# Patient Record
Sex: Male | Born: 1945
Health system: Southern US, Community
[De-identification: ages and names within clinical notes are randomized; demographics above are authoritative.]

## PROBLEM LIST (undated history)

## (undated) DIAGNOSIS — C801 Malignant (primary) neoplasm, unspecified: Secondary | ICD-10-CM

## (undated) DIAGNOSIS — M1990 Unspecified osteoarthritis, unspecified site: Secondary | ICD-10-CM

## (undated) DIAGNOSIS — J189 Pneumonia, unspecified organism: Secondary | ICD-10-CM

## (undated) DIAGNOSIS — C61 Malignant neoplasm of prostate: Secondary | ICD-10-CM

## (undated) DIAGNOSIS — N529 Male erectile dysfunction, unspecified: Secondary | ICD-10-CM

## (undated) DIAGNOSIS — R011 Cardiac murmur, unspecified: Secondary | ICD-10-CM

## (undated) DIAGNOSIS — G629 Polyneuropathy, unspecified: Secondary | ICD-10-CM

## (undated) DIAGNOSIS — I10 Essential (primary) hypertension: Secondary | ICD-10-CM

## (undated) DIAGNOSIS — Z8719 Personal history of other diseases of the digestive system: Secondary | ICD-10-CM

## (undated) DIAGNOSIS — G709 Myoneural disorder, unspecified: Secondary | ICD-10-CM

## (undated) DIAGNOSIS — F419 Anxiety disorder, unspecified: Secondary | ICD-10-CM

## (undated) DIAGNOSIS — M4800 Spinal stenosis, site unspecified: Secondary | ICD-10-CM

## (undated) DIAGNOSIS — K4030 Unilateral inguinal hernia, with obstruction, without gangrene, not specified as recurrent: Secondary | ICD-10-CM

## (undated) DIAGNOSIS — T7840XA Allergy, unspecified, initial encounter: Secondary | ICD-10-CM

## (undated) HISTORY — DX: Myoneural disorder, unspecified: G70.9

## (undated) HISTORY — DX: Essential (primary) hypertension: I10

## (undated) HISTORY — PX: OTHER SURGICAL HISTORY: SHX169

## (undated) HISTORY — PX: COLONOSCOPY: SHX174

## (undated) HISTORY — DX: Cardiac murmur, unspecified: R01.1

## (undated) HISTORY — DX: Allergy, unspecified, initial encounter: T78.40XA

## (undated) HISTORY — DX: Unspecified osteoarthritis, unspecified site: M19.90

## (undated) HISTORY — DX: Male erectile dysfunction, unspecified: N52.9

## (undated) HISTORY — DX: Anxiety disorder, unspecified: F41.9

---

## 2005-04-29 ENCOUNTER — Ambulatory Visit: Payer: Self-pay | Admitting: Family Medicine

## 2005-05-06 ENCOUNTER — Ambulatory Visit: Payer: Self-pay | Admitting: Family Medicine

## 2005-06-01 ENCOUNTER — Ambulatory Visit: Payer: Self-pay | Admitting: Internal Medicine

## 2005-06-07 ENCOUNTER — Ambulatory Visit: Payer: Self-pay | Admitting: Internal Medicine

## 2006-03-26 ENCOUNTER — Ambulatory Visit: Payer: Self-pay | Admitting: Family Medicine

## 2006-06-04 ENCOUNTER — Ambulatory Visit: Payer: Self-pay | Admitting: Family Medicine

## 2006-10-11 ENCOUNTER — Ambulatory Visit: Payer: Self-pay | Admitting: Family Medicine

## 2006-11-15 ENCOUNTER — Ambulatory Visit: Payer: Self-pay | Admitting: Family Medicine

## 2006-11-15 LAB — CONVERTED CEMR LAB
ALT: 33 units/L (ref 0–40)
AST: 28 units/L (ref 0–37)
Albumin: 4.5 g/dL (ref 3.5–5.2)
BUN: 18 mg/dL (ref 6–23)
Basophils Absolute: 0 10*3/uL (ref 0.0–0.1)
Basophils Relative: 0.3 % (ref 0.0–1.0)
CO2: 31 meq/L (ref 19–32)
Calcium: 9.3 mg/dL (ref 8.4–10.5)
Eosinophil percent: 3.1 % (ref 0.0–5.0)
Glomerular Filtration Rate, Af Am: 67 mL/min/{1.73_m2}
Glucose, Bld: 91 mg/dL (ref 70–99)
MCHC: 33.5 g/dL (ref 30.0–36.0)
MCV: 90.2 fL (ref 78.0–100.0)
Monocytes Absolute: 0.4 10*3/uL (ref 0.2–0.7)
Neutro Abs: 3.8 10*3/uL (ref 1.4–7.7)
Neutrophils Relative %: 68.3 % (ref 43.0–77.0)
Potassium: 3.3 meq/L — ABNORMAL LOW (ref 3.5–5.1)
RBC: 4.9 M/uL (ref 4.22–5.81)
RDW: 11.6 % (ref 11.5–14.6)
Sodium: 140 meq/L (ref 135–145)
TSH: 1.4 microintl units/mL (ref 0.35–5.50)
Total Bilirubin: 1.2 mg/dL (ref 0.3–1.2)
VLDL: 27 mg/dL (ref 0–40)

## 2007-02-11 ENCOUNTER — Ambulatory Visit: Payer: Self-pay | Admitting: Family Medicine

## 2007-12-14 ENCOUNTER — Telehealth: Payer: Self-pay | Admitting: Family Medicine

## 2007-12-14 ENCOUNTER — Ambulatory Visit: Payer: Self-pay | Admitting: Family Medicine

## 2007-12-14 DIAGNOSIS — M545 Low back pain: Secondary | ICD-10-CM

## 2007-12-26 ENCOUNTER — Ambulatory Visit: Payer: Self-pay | Admitting: Family Medicine

## 2007-12-29 ENCOUNTER — Ambulatory Visit: Payer: Self-pay | Admitting: Family Medicine

## 2008-11-25 ENCOUNTER — Telehealth: Payer: Self-pay | Admitting: Family Medicine

## 2008-12-19 ENCOUNTER — Telehealth: Payer: Self-pay | Admitting: *Deleted

## 2009-01-10 ENCOUNTER — Ambulatory Visit: Payer: Self-pay | Admitting: Family Medicine

## 2009-01-10 LAB — CONVERTED CEMR LAB
Albumin: 4.2 g/dL (ref 3.5–5.2)
Alkaline Phosphatase: 40 units/L (ref 39–117)
Basophils Relative: 0.5 % (ref 0.0–3.0)
Bilirubin Urine: NEGATIVE
Bilirubin, Direct: 0.1 mg/dL (ref 0.0–0.3)
CO2: 32 meq/L (ref 19–32)
Calcium: 9.5 mg/dL (ref 8.4–10.5)
Cholesterol: 177 mg/dL (ref 0–200)
Direct LDL: 105.6 mg/dL
Eosinophils Absolute: 0.2 10*3/uL (ref 0.0–0.7)
Eosinophils Relative: 3.6 % (ref 0.0–5.0)
GFR calc Af Amer: 57 mL/min
GFR calc non Af Amer: 47 mL/min
Glucose, Bld: 119 mg/dL — ABNORMAL HIGH (ref 70–99)
HDL: 34.4 mg/dL — ABNORMAL LOW (ref >39.0)
Lymphocytes Relative: 25.1 % (ref 12.0–46.0)
MCHC: 35.9 g/dL (ref 30.0–36.0)
Monocytes Absolute: 0.5 10*3/uL (ref 0.1–1.0)
Monocytes Relative: 10.1 % (ref 3.0–12.0)
Neutro Abs: 3.3 10*3/uL (ref 1.4–7.7)
Neutrophils Relative %: 60.7 % (ref 43.0–77.0)
Nitrite: NEGATIVE
PSA: 2.76 ng/mL (ref 0.10–4.00)
Platelets: 199 10*3/uL (ref 150–400)
Sodium: 140 meq/L (ref 135–145)
TSH: 1.27 microintl units/mL (ref 0.35–5.50)
Triglycerides: 261 mg/dL (ref 0–149)
WBC: 5.4 10*3/uL (ref 4.5–10.5)

## 2009-01-17 ENCOUNTER — Ambulatory Visit: Payer: Self-pay | Admitting: Family Medicine

## 2009-01-17 DIAGNOSIS — M1990 Unspecified osteoarthritis, unspecified site: Secondary | ICD-10-CM | POA: Insufficient documentation

## 2009-01-17 DIAGNOSIS — F528 Other sexual dysfunction not due to a substance or known physiological condition: Secondary | ICD-10-CM | POA: Insufficient documentation

## 2009-01-17 DIAGNOSIS — I10 Essential (primary) hypertension: Secondary | ICD-10-CM | POA: Insufficient documentation

## 2009-10-21 ENCOUNTER — Ambulatory Visit: Payer: Self-pay | Admitting: Family Medicine

## 2010-05-18 ENCOUNTER — Ambulatory Visit: Payer: Self-pay | Admitting: Family Medicine

## 2010-05-18 LAB — CONVERTED CEMR LAB
ALT: 28 units/L (ref 0–53)
BUN: 25 mg/dL — ABNORMAL HIGH (ref 6–23)
Basophils Relative: 0.5 % (ref 0.0–3.0)
CO2: 33 meq/L — ABNORMAL HIGH (ref 19–32)
Calcium: 9.4 mg/dL (ref 8.4–10.5)
Chloride: 98 meq/L (ref 96–112)
Cholesterol: 166 mg/dL (ref 0–200)
Creatinine, Ser: 1.3 mg/dL (ref 0.4–1.5)
Direct LDL: 97.7 mg/dL
GFR calc non Af Amer: 58.66 mL/min (ref >60)
Glucose, Bld: 101 mg/dL — ABNORMAL HIGH (ref 70–99)
Glucose, Urine, Semiquant: NEGATIVE
HCT: 42.1 % (ref 39.0–52.0)
Hemoglobin: 14.8 g/dL (ref 13.0–17.0)
MCHC: 35.1 g/dL (ref 30.0–36.0)
Monocytes Relative: 7.5 % (ref 3.0–12.0)
Neutrophils Relative %: 68.4 % (ref 43.0–77.0)
Nitrite: NEGATIVE
RBC: 4.63 M/uL (ref 4.22–5.81)
TSH: 1.8 microintl units/mL (ref 0.35–5.50)
Total Bilirubin: 1.2 mg/dL (ref 0.3–1.2)
Triglycerides: 206 mg/dL — ABNORMAL HIGH (ref 0.0–149.0)
VLDL: 41.2 mg/dL — ABNORMAL HIGH (ref 0.0–40.0)
WBC Urine, dipstick: NEGATIVE
WBC: 6.2 10*3/uL (ref 4.5–10.5)

## 2010-06-01 ENCOUNTER — Ambulatory Visit: Payer: Self-pay | Admitting: Family Medicine

## 2010-06-03 ENCOUNTER — Telehealth: Payer: Self-pay | Admitting: Family Medicine

## 2010-10-09 ENCOUNTER — Telehealth: Payer: Self-pay | Admitting: *Deleted

## 2011-01-26 NOTE — Assessment & Plan Note (Signed)
Summary: cpx/njr/pt rsc/cjr   Vital Signs:  Patient profile:   65 year old male Height:      70 inches Weight:      200 pounds BMI:     28.80 Temp:     98.1 degrees F oral BP sitting:   130 / 90  (left arm) Cuff size:   regular  Vitals Entered By: Kern Reap CMA Duncan Dull) (June 01, 2010 10:52 AM) CC: cpx   CC:  cpx.  History of Present Illness: Antonio Newton is a 65 year old male, nonsmoker, who comes in today for evaluation of hypertension erectile dysfunction.  His hypertension.  History with atenolol 100 mg daily and Maxzide one daily BP 130/90.  He went to the [facility]rology Center for evaluation of erectile dysfunction.  he was told he had low testosterone.  He check a testosterone supplement for 3 months and then stopped it.  He's due to go back to them for follow-up.  They checked his prostate yearly.  He would also like a refill of the Hydromet for his cough twice yearly.  He does not get annual eye exams, he does take an aspirin daily.  Tetanus 2003, flu shot 2010.    Allergies: 1)  ! * Pcn  Past History:  Past medical, surgical, family and social histories (including risk factors) reviewed, and no changes noted (except as noted below).  Past Medical History: Reviewed history from 01/17/2009 and no changes required. hypertension erectile dysfunction Osteoarthritis left hip Hypertension  Family History: Reviewed history from 12/14/2007 and no changes required. Family History Hypertension Family History of Prostate CA 1st degree relative <50  Social History: Reviewed history from 12/14/2007 and no changes required. 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 No apparent alopecia or balding. Eyes:  No corneal or conjunctival inflammation noted. EOMI. Perrla. Funduscopic exam benign, without hemorrhages, exudates or papilledema. Vision grossly normal. Ears:  External ear exam shows no significant lesions or deformities.  Otoscopic examination reveals clear canals, tympanic membranes are intact bilaterally without bulging, retraction, inflammation or discharge. Hearing is grossly normal bilaterally. Nose:  External nasal examination shows no deformity or inflammation. Nasal mucosa are pink and moist without lesions or exudates. Mouth:  Oral mucosa and oropharynx without lesions or exudates.  Teeth in good repair. Neck:  No deformities, masses, or tenderness noted. Chest Wall:  No deformities, masses, tenderness or gynecomastia noted. Breasts:  No masses or gynecomastia noted Lungs:  Normal respiratory effort, chest expands symmetrically. Lungs are clear to auscultation, no crackles or wheezes. Heart:  Normal rate and regular rhythm. S1 and S2 normal without gallop, murmur, click, rub or other extra sounds. Abdomen:  Bowel sounds positive,abdomen soft and non-tender without masses, organomegaly or hernias noted. Msk:  No deformity or scoliosis noted of thoracic or lumbar spine.   Pulses:  R and L carotid,radial,femoral,dorsalis pedis and posterior tibial pulses are full and equal bilaterally Extremities:  No clubbing, cyanosis, edema, or deformity noted with normal full range of motion of all joints.   Neurologic:  No cranial nerve deficits noted. Station and gait are normal. Plantar reflexes are down-going bilaterally. DTRs are symmetrical throughout. Sensory, motor and coordinative functions appear intact. Skin:  Intact without suspicious lesions or  rashes Cervical Nodes:  No lymphadenopathy noted Axillary Nodes:  No palpable lymphadenopathy Inguinal Nodes:  No significant adenopathy Psych:  Cognition and judgment appear  intact. Alert and cooperative with normal attention span and concentration. No apparent delusions, illusions, hallucinations   Impression & Recommendations:  Problem # 1:  ROUTINE GENERAL MEDICAL EXAM@HEALTH  CARE FACL (ICD-V70.0) Assessment Unchanged  Orders: Prescription Created Electronically [phone]) EKG w/ Interpretation (93000)  Problem # 2:  HYPERTENSION (ICD-401.9) Assessment: Improved  His updated medication list for this problem includes:    Atenolol 100 Mg Tabs (Atenolol) .Marland Kitchen... Take 1 tablet by mouth once a day    Maxzide 75-50 Mg Tabs (Triamterene-hctz) .Marland Kitchen... Take 1 tablet by mouth once a day  Orders: Prescription Created Electronically [phone]) EKG w/ Interpretation (93000)  Complete Medication List: 1)  Atenolol 100 Mg Tabs (Atenolol) .... Take 1 tablet by mouth once a day 2)  Maxzide 75-50 Mg Tabs (Triamterene-hctz) .... Take 1 tablet by mouth once a day 3)  Exedrin  4)  Asa 81  5)  Mvi  6)  Ca + & Vit D  7)  Viagra 100 Mg Tabs (Sildenafil citrate) .... Uad 8)  Hydrocodone-homatropine 5-1.5 Mg/39ml Syrp (Hydrocodone-homatropine) .... One tsp by mouth q 4 to 6 hours prn cough 9)  Fish Oil Oil (Fish oil) .... Take one tab by mouth once daily 10)  Magnesium 200 Mg Tabs (Magnesium) .... Take one tab by mouth once daily  Patient Instructions: 1)  Please schedule a follow-up appointment in 1 year. 2)  It is important that you exercise regularly at least 20 minutes 5 times a week. If you develop chest pain, have severe difficulty breathing, or feel very tired , stop exercising immediately and seek medical attention. 3)  Schedule a colonoscopy/sigmoidoscopy to help detect colon cancer. 4)  Take an Aspirin every day. Prescriptions: HYDROCODONE-HOMATROPINE 5-1.5 MG/5ML SYRP (HYDROCODONE-HOMATROPINE) one tsp by mouth q 4 to 6 hours prn cough  #120  ml x 1   Entered and Authorized by:   Roderick Pee MD   Signed by:   Roderick Pee MD on 06/01/2010   Method used:   Print  then Give to Patient   RxID:   [id number] MAXZIDE 75-50 MG TABS (TRIAMTERENE-HCTZ) Take 1 tablet by mouth once a day  #100 Each x 3   Entered and Authorized by:   Roderick Pee MD   Signed by:   Roderick Pee MD on 06/01/2010   Method used:   Electronically to        [facility] [address] (retail)       [phone]       [place], [place]  [address]       Ph: [phone]       Fax: [phone]   RxID:   [id number] ATENOLOL 100 MG TABS (ATENOLOL) Take 1 tablet by mouth once a day  #100 Each x 3   Entered and Authorized by:   Roderick Pee MD   Signed by:   Roderick Pee MD on 06/01/2010   Method used:   Electronically to        [facility] [address] (retail)       [phone]       [place], [place]  [address]       Ph: [phone]       Fax: [phone]  RxID:   [id number]    Immunization History:  Influenza Immunization History:    Influenza:  historical (09/26/2009)

## 2011-01-26 NOTE — Progress Notes (Signed)
  Phone Note From Pharmacy   Summary of Call: Maxzide 75-50 mg tabs are on back order with no release date. [facility] does have 37.5/ 25mg . Is it ok to switch strengths? ...... OK to switch strengths. ........Marland KitchenPer DR. Todd verbally........................................................................................................................................................ Initial call taken by: Kathrynn Speed CMA,  June 03, 2010 10:32 AM

## 2011-01-26 NOTE — Progress Notes (Signed)
Summary: REFILL REQUEST  Phone Note Refill Request Message from:  Patient on October 09, 2010 12:38 PM  Refills Requested: Medication #1:  VIAGRA 100 MG TABS UAD   Notes: Psychologist, forensic - [address]    Initial call taken by: Debbra Riding,  October 09, 2010 12:38 PM  Follow-up for Phone Call        Viagra 100 mg, dispensed 6 tablets directions.  Uses directed refills x 11 Follow-up by: Roderick Pee MD,  October 09, 2010 2:08 PM    Prescriptions: VIAGRA 100 MG TABS (SILDENAFIL CITRATE) UAD  #6 x 11   Entered by:   Lynann Beaver CMA   Authorized by:   Roderick Pee MD   Signed by:   Lynann Beaver CMA on 10/09/2010   Method used:   Electronically to        [facility] [address] (retail)       [phone]       [place], [place]  [address]       Ph: [phone]       Fax: [phone]   RxID:   [id number]

## 2011-05-11 NOTE — Assessment & Plan Note (Signed)
[facility]                                 ON-CALL NOTE   Antonio Newton, Antonio Newton                      MRN:          [id number]  DATE:12/24/2007                            DOB:          December 27, 1946    PRIMARY CARE PHYSICIAN:  Dr. Tawanna Cooler.   [name] is a patient of Dr. Nelida Meuse who reports that he was seen on  the 18th for low back pain radiating into his leg. He was started on  some antiinflammatory, as well as Flexeril and a prednisone taper. The  patient reports that he was feeling better and therefore, over the  weekend, he started to walk around in the mall and be more active. Last  night, he noted some increasing discomfort again and he reports that the  pain is again present. He does report that it is not as severe as it was  previously when he saw Dr. Tawanna Cooler. He states that this is localized to the  right hip. He denies any bowel or bladder dysfunction. He does note some  radiculopathy into the knee, but it is not worse than it was previously.  He was wondering what he could do until he sees Dr. Tawanna Cooler on Tuesday.   PLAN:  I advised the patient that he could go ahead and take some  ibuprofen as long as he has no side effects. He can take 600 mg of  ibuprofen every 8 hours as needed with food until he calls Dr. Tawanna Cooler  tomorrow morning for further recommendation. The patient will call with  any other concerns.     Leanne Chang, M.D.  Electronically Signed    LA/MedQ  DD: 12/24/2007  DT: 12/25/2007  Job #: [id number]

## 2011-05-14 NOTE — Assessment & Plan Note (Signed)
[facility]                                 ON-CALL NOTE   RITTER, HELSLEY                      MRN:          [id number]  DATE:02/11/2007                            DOB:          [birth year]    PRIMARY:  Dr. Tawanna Cooler.   Phone number [phone].   SUBJECTIVE:  Bad chest cold, occasional wheezing.   ASSESSMENT AND PLAN:  Come to Saturday clinic.     Kerby Nora, MD  Electronically Signed    AB/MedQ  DD: 02/12/2007  DT: 02/12/2007  Job #: [id number]

## 2011-06-07 ENCOUNTER — Other Ambulatory Visit (INDEPENDENT_AMBULATORY_CARE_PROVIDER_SITE_OTHER): Payer: BC Managed Care – PPO

## 2011-06-07 DIAGNOSIS — Z0000 Encounter for general adult medical examination without abnormal findings: Secondary | ICD-10-CM

## 2011-06-07 LAB — CBC WITH DIFFERENTIAL/PLATELET
Eosinophils Relative: 4.8 % (ref 0.0–5.0)
HCT: 41.6 % (ref 39.0–52.0)
Lymphs Abs: 1.3 10*3/uL (ref 0.7–4.0)
MCHC: 34.7 g/dL (ref 30.0–36.0)
MCV: 91.3 fl (ref 78.0–100.0)
RBC: 4.56 Mil/uL (ref 4.22–5.81)
RDW: 13.1 % (ref 11.5–14.6)
WBC: 5.6 10*3/uL (ref 4.5–10.5)

## 2011-06-07 LAB — LIPID PANEL
HDL: 39.3 mg/dL (ref >39.00)
LDL Cholesterol: 110 mg/dL — ABNORMAL HIGH (ref 0–99)
Total CHOL/HDL Ratio: 5
Triglycerides: 148 mg/dL (ref 0.0–149.0)

## 2011-06-07 LAB — BASIC METABOLIC PANEL
BUN: 28 mg/dL — ABNORMAL HIGH (ref 6–23)
CO2: 30 mEq/L (ref 19–32)
Calcium: 9.2 mg/dL (ref 8.4–10.5)
Chloride: 102 mEq/L (ref 96–112)
Creatinine, Ser: 1.6 mg/dL — ABNORMAL HIGH (ref 0.4–1.5)
GFR: 45.43 mL/min — ABNORMAL LOW (ref >60.00)
Glucose, Bld: 108 mg/dL — ABNORMAL HIGH (ref 70–99)

## 2011-06-07 LAB — POCT URINALYSIS DIPSTICK
Blood, UA: NEGATIVE
Ketones, UA: NEGATIVE
Protein, UA: NEGATIVE
Spec Grav, UA: 1.02
pH, UA: 6.5

## 2011-06-07 LAB — HEPATIC FUNCTION PANEL
Bilirubin, Direct: 0.1 mg/dL (ref 0.0–0.3)
Total Bilirubin: 0.9 mg/dL (ref 0.3–1.2)

## 2011-06-14 ENCOUNTER — Encounter: Payer: Self-pay | Admitting: Family Medicine

## 2011-06-14 ENCOUNTER — Other Ambulatory Visit: Payer: Self-pay | Admitting: Family Medicine

## 2011-06-25 ENCOUNTER — Encounter: Payer: Self-pay | Admitting: Family Medicine

## 2011-06-28 ENCOUNTER — Ambulatory Visit (INDEPENDENT_AMBULATORY_CARE_PROVIDER_SITE_OTHER): Payer: BC Managed Care – PPO | Admitting: Family Medicine

## 2011-06-28 ENCOUNTER — Encounter: Payer: Self-pay | Admitting: Family Medicine

## 2011-06-28 DIAGNOSIS — F528 Other sexual dysfunction not due to a substance or known physiological condition: Secondary | ICD-10-CM

## 2011-06-28 DIAGNOSIS — I10 Essential (primary) hypertension: Secondary | ICD-10-CM

## 2011-06-28 MED ORDER — ATENOLOL 100 MG PO TABS: 100.0000 mg | ORAL_TABLET | Freq: Every day | ORAL | Status: DC

## 2011-06-28 MED ORDER — TRIAMTERENE-HCTZ 75-50 MG PO TABS: 1.0000 | ORAL_TABLET | Freq: Every day | ORAL | Status: DC

## 2011-06-28 MED ORDER — SILDENAFIL CITRATE 100 MG PO TABS: 100.0000 mg | ORAL_TABLET | Freq: Every day | ORAL | Status: DC | PRN

## 2011-06-28 NOTE — Patient Instructions (Addendum)
Continue your current medications.,,,,,,,,,,,,Except stop the diuretic, and drinking 24 ounces of water a day.  Checked her blood pressure daily at home.  Return for weeks for follow-up fasting lab one week prior

## 2011-06-28 NOTE — Progress Notes (Signed)
  Subjective:    Patient ID: Antonio Newton, male    DOB: 08/22/1946, 65 y.o.   MRN: [id number]  HPIdannie Is a 65 year old, married male, nonsmoker, who comes in today for evaluation of hypertension, and erectile dysfunction.  Two hypertension.  He takes Tenormin 100 mg daily and Maxzide 75 mg daily.  BP 124/88.  Erectile dysfunction.  He uses Viagra 100 mg p.r.n.  He also takes an 81-mg baby aspirin.  He has routine eye care, dental care, colonoscopy, normal, tetanus, 2003, information given on shingles    Review of Systems  Constitutional: Negative.   HENT: Negative.   Eyes: Negative.   Respiratory: Negative.   Cardiovascular: Negative.   Gastrointestinal: Negative.   Genitourinary: Negative.   Musculoskeletal: Negative.   Skin: Negative.   Neurological: Negative.   Hematological: Negative.   Psychiatric/Behavioral: Negative.        Objective:   Physical Exam  Constitutional: He is oriented to person, place, and time. He appears well-developed and well-nourished.  HENT:  Head: Normocephalic and atraumatic.  Right Ear: External ear normal.  Left Ear: External ear normal.  Nose: Nose normal.  Mouth/Throat: Oropharynx is clear and moist.  Eyes: Conjunctivae and EOM are normal. Pupils are equal, round, and reactive to light.  Neck: Normal range of motion. Neck supple. No JVD present. No tracheal deviation present. No thyromegaly present.  Cardiovascular: Normal rate, regular rhythm, normal heart sounds and intact distal pulses.  Exam reveals no gallop and no friction rub.   No murmur heard. Pulmonary/Chest: Effort normal and breath sounds normal. No stridor. No respiratory distress. He has no wheezes. He has no rales. He exhibits no tenderness.  Abdominal: Soft. Bowel sounds are normal. He exhibits no distension and no mass. There is no tenderness. There is no rebound and no guarding.  Genitourinary: Rectum normal, prostate normal and penis normal. Guaiac negative  stool. No penile tenderness.  Musculoskeletal: Normal range of motion. He exhibits no edema and no tenderness.  Lymphadenopathy:    He has no cervical adenopathy.  Neurological: He is alert and oriented to person, place, and time. He has normal reflexes. No cranial nerve deficit. He exhibits normal muscle tone.  Skin: Skin is warm and dry. No rash noted. No erythema. No pallor.  Psychiatric: He has a normal mood and affect. His behavior is normal. Judgment and thought content normal.          Assessment & Plan:  Healthy male.  Hypertension.  Continue Tenormin and Maxzide.  Erectile dysfunction continue Viagra p.r.n.

## 2011-07-01 ENCOUNTER — Encounter: Payer: Self-pay | Admitting: Family Medicine

## 2011-07-06 ENCOUNTER — Telehealth: Payer: Self-pay | Admitting: *Deleted

## 2011-07-06 NOTE — Telephone Encounter (Signed)
Since going off Triamterene-Hctz, pt complains of headaches q pm.  Is in [place] and wants to go back on the med?

## 2011-07-06 NOTE — Telephone Encounter (Signed)
Restart Maxzide one tablet daily

## 2011-07-07 NOTE — Telephone Encounter (Signed)
BP readings are running 160's/80's.  Advised pt to take Maxide, and monitor BP.

## 2011-07-23 ENCOUNTER — Telehealth: Payer: Self-pay | Admitting: *Deleted

## 2011-07-23 NOTE — Telephone Encounter (Signed)
Wants to know whether to keep his appt this Tuesday???

## 2011-07-23 NOTE — Telephone Encounter (Signed)
Pt has restarted his blood pressure medication because of headaches he would like to know when to follow up with dr todd and labs for his kidneys?

## 2011-07-27 ENCOUNTER — Other Ambulatory Visit: Payer: BC Managed Care – PPO

## 2011-08-02 NOTE — Telephone Encounter (Signed)
Follow-up appointment as outlined

## 2011-08-03 ENCOUNTER — Ambulatory Visit: Payer: BC Managed Care – PPO | Admitting: Family Medicine

## 2011-10-01 ENCOUNTER — Telehealth: Payer: Self-pay | Admitting: Family Medicine

## 2011-10-01 NOTE — Telephone Encounter (Signed)
Pt wants to know if he is due to get a tdap vax? Also pt is turning 65 on [birth year] and is wondering if he needs to get pneumonia vax prior to birth date?

## 2011-10-01 NOTE — Telephone Encounter (Signed)
Left message on machine for patient

## 2011-10-06 ENCOUNTER — Ambulatory Visit: Payer: BC Managed Care – PPO

## 2011-10-06 ENCOUNTER — Ambulatory Visit (INDEPENDENT_AMBULATORY_CARE_PROVIDER_SITE_OTHER): Payer: BC Managed Care – PPO | Admitting: Family Medicine

## 2011-10-06 DIAGNOSIS — Z23 Encounter for immunization: Secondary | ICD-10-CM

## 2011-10-06 DIAGNOSIS — Z2911 Encounter for prophylactic immunotherapy for respiratory syncytial virus (RSV): Secondary | ICD-10-CM

## 2012-03-27 ENCOUNTER — Telehealth: Payer: Self-pay | Admitting: Family Medicine

## 2012-03-27 MED ORDER — PROMETHAZINE HCL 25 MG RE SUPP: 25.0000 mg | Freq: Four times a day (QID) | RECTAL | Status: DC | PRN

## 2012-03-27 NOTE — Telephone Encounter (Signed)
Pt had been experiencing Vomiting,and Diarrhea since last night and would like to be seen today.

## 2012-03-27 NOTE — Telephone Encounter (Signed)
Clear liquids and Phenergan 25 mg suppositories #6 direction Devoria Glassing every 6-8 hours for nausea and vomiting

## 2012-03-27 NOTE — Telephone Encounter (Signed)
Spoke with wife and patient does not have any pain or fever.  [facility] [address]

## 2012-03-27 NOTE — Telephone Encounter (Signed)
rx sent and spoke with wife

## 2012-04-21 ENCOUNTER — Encounter: Payer: Self-pay | Admitting: Internal Medicine

## 2012-07-27 ENCOUNTER — Other Ambulatory Visit: Payer: Self-pay | Admitting: Family Medicine

## 2012-07-31 ENCOUNTER — Telehealth: Payer: Self-pay | Admitting: Family Medicine

## 2012-07-31 NOTE — Telephone Encounter (Signed)
Pt called and rcvd a notice that pt needs to sch a colonoscopy. Pt wanted to know if he needed to get colonoscopy sched before his cpx in October?

## 2012-07-31 NOTE — Telephone Encounter (Signed)
Please ask him to call GI directly

## 2012-08-01 NOTE — Telephone Encounter (Signed)
Left detailed message on machine with GI phone number and last colonoscopy date

## 2012-10-04 ENCOUNTER — Other Ambulatory Visit: Payer: BC Managed Care – PPO

## 2012-10-09 ENCOUNTER — Other Ambulatory Visit: Payer: Self-pay | Admitting: Family Medicine

## 2012-10-19 ENCOUNTER — Encounter: Payer: BC Managed Care – PPO | Admitting: Family Medicine

## 2012-11-08 ENCOUNTER — Other Ambulatory Visit: Payer: Self-pay | Admitting: Family Medicine

## 2012-11-29 ENCOUNTER — Other Ambulatory Visit (INDEPENDENT_AMBULATORY_CARE_PROVIDER_SITE_OTHER): Payer: Medicare Other

## 2012-11-29 DIAGNOSIS — I10 Essential (primary) hypertension: Secondary | ICD-10-CM

## 2012-11-29 DIAGNOSIS — Z0000 Encounter for general adult medical examination without abnormal findings: Secondary | ICD-10-CM

## 2012-11-29 LAB — CBC WITH DIFFERENTIAL/PLATELET
Basophils Relative: 0.4 % (ref 0.0–3.0)
Eosinophils Relative: 4.7 % (ref 0.0–5.0)
HCT: 44.7 % (ref 39.0–52.0)
Hemoglobin: 14.9 g/dL (ref 13.0–17.0)
Lymphs Abs: 1.2 10*3/uL (ref 0.7–4.0)
MCV: 91.1 fl (ref 78.0–100.0)
Monocytes Absolute: 0.4 10*3/uL (ref 0.1–1.0)
Monocytes Relative: 7.4 % (ref 3.0–12.0)
Neutro Abs: 3.8 10*3/uL (ref 1.4–7.7)
WBC: 5.7 10*3/uL (ref 4.5–10.5)

## 2012-11-29 LAB — BASIC METABOLIC PANEL
BUN: 25 mg/dL — ABNORMAL HIGH (ref 6–23)
CO2: 32 mEq/L (ref 19–32)
Calcium: 9.4 mg/dL (ref 8.4–10.5)
Creatinine, Ser: 1.4 mg/dL (ref 0.4–1.5)
GFR: 56.21 mL/min — ABNORMAL LOW (ref >60.00)
Glucose, Bld: 99 mg/dL (ref 70–99)

## 2012-11-29 LAB — LIPID PANEL
HDL: 37.8 mg/dL — ABNORMAL LOW (ref >39.00)
Total CHOL/HDL Ratio: 5

## 2012-11-29 LAB — POCT URINALYSIS DIPSTICK
Bilirubin, UA: NEGATIVE
Glucose, UA: NEGATIVE
Leukocytes, UA: NEGATIVE
Nitrite, UA: NEGATIVE
pH, UA: 7

## 2012-11-29 LAB — HEPATIC FUNCTION PANEL
ALT: 40 U/L (ref 0–53)
Total Bilirubin: 1 mg/dL (ref 0.3–1.2)

## 2012-11-29 LAB — PSA: PSA: 2.87 ng/mL (ref 0.10–4.00)

## 2012-12-06 ENCOUNTER — Ambulatory Visit (INDEPENDENT_AMBULATORY_CARE_PROVIDER_SITE_OTHER): Payer: Medicare Other | Admitting: Family Medicine

## 2012-12-06 ENCOUNTER — Encounter: Payer: Self-pay | Admitting: Family Medicine

## 2012-12-06 VITALS — BP 140/90 | Temp 98.3°F | Ht 71.0 in | Wt 204.0 lb

## 2012-12-06 DIAGNOSIS — F528 Other sexual dysfunction not due to a substance or known physiological condition: Secondary | ICD-10-CM

## 2012-12-06 DIAGNOSIS — E291 Testicular hypofunction: Secondary | ICD-10-CM

## 2012-12-06 DIAGNOSIS — R7989 Other specified abnormal findings of blood chemistry: Secondary | ICD-10-CM | POA: Insufficient documentation

## 2012-12-06 DIAGNOSIS — M1990 Unspecified osteoarthritis, unspecified site: Secondary | ICD-10-CM

## 2012-12-06 DIAGNOSIS — Z0000 Encounter for general adult medical examination without abnormal findings: Secondary | ICD-10-CM

## 2012-12-06 DIAGNOSIS — Z23 Encounter for immunization: Secondary | ICD-10-CM

## 2012-12-06 DIAGNOSIS — I10 Essential (primary) hypertension: Secondary | ICD-10-CM

## 2012-12-06 MED ORDER — SILDENAFIL CITRATE 100 MG PO TABS: 100.0000 mg | ORAL_TABLET | Freq: Every day | ORAL | Status: DC | PRN

## 2012-12-06 MED ORDER — ATENOLOL 100 MG PO TABS: 100.0000 mg | ORAL_TABLET | Freq: Every day | ORAL | Status: DC

## 2012-12-06 MED ORDER — TRIAMTERENE-HCTZ 75-50 MG PO TABS: 1.0000 | ORAL_TABLET | Freq: Every day | ORAL | Status: DC

## 2012-12-06 NOTE — Patient Instructions (Signed)
Continue your current medication  Check a blood pressure daily in the morning for 3 weeks to be sure your blood pressures at goal,,,,,,,,,,, 135/85 or less  Return in one year for general physical examination sooner if any problems

## 2012-12-06 NOTE — Progress Notes (Signed)
  Subjective:    Patient ID: Antonio Newton, male    DOB: 06-Jul-1946, 66 y.o.   MRN: [id number]  HPI Antonio Newton is a 66 year old married male nonsmoker who comes in today for his first Medicare wellness examination because of a history of underlying hypertension erectile dysfunction and low testosterone  He takes an aspirin tablet daily along with 100 mg of Tenormin and Maxide 75-50 dose one daily for hypertension BP 140/90. He's going to do some blood pressure monitoring at home: 35/85 or less.  He's on AndroGel by Dr. Reece Agar. his urologist  He uses Viagra 100 mg when necessary for ED  He gets routine eye care, dental care, colonoscopy and GI, tetanus 2003 booster today, seasonal flu shot today, shingles 2012, Pneumovax next week   Review of Systems  Constitutional: Negative.   HENT: Negative.   Eyes: Negative.   Respiratory: Negative.   Cardiovascular: Negative.   Gastrointestinal: Negative.   Genitourinary: Negative.   Musculoskeletal: Negative.   Skin: Negative.   Neurological: Negative.   Hematological: Negative.   Psychiatric/Behavioral: Negative.        Objective:   Physical Exam  Constitutional: He is oriented to person, place, and time. He appears well-developed and well-nourished.  HENT:  Head: Normocephalic and atraumatic.  Right Ear: External ear normal.  Left Ear: External ear normal.  Nose: Nose normal.  Mouth/Throat: Oropharynx is clear and moist.  Eyes: Conjunctivae normal and EOM are normal. Pupils are equal, round, and reactive to light.  Neck: Normal range of motion. Neck supple. No JVD present. No tracheal deviation present. No thyromegaly present.  Cardiovascular: Normal rate, regular rhythm, normal heart sounds and intact distal pulses.  Exam reveals no gallop and no friction rub.   No murmur heard. Pulmonary/Chest: Effort normal and breath sounds normal. No stridor. No respiratory distress. He has no wheezes. He has no rales. He exhibits no tenderness.   Abdominal: Soft. Bowel sounds are normal. He exhibits no distension and no mass. There is no tenderness. There is no rebound and no guarding.  Genitourinary: Rectum normal, prostate normal and penis normal. Guaiac negative stool. No penile tenderness.  Musculoskeletal: Normal range of motion. He exhibits no edema and no tenderness.  Lymphadenopathy:    He has no cervical adenopathy.  Neurological: He is alert and oriented to person, place, and time. He has normal reflexes. No cranial nerve deficit. He exhibits normal muscle tone.  Skin: Skin is warm and dry. No rash noted. No erythema. No pallor.  Psychiatric: He has a normal mood and affect. His behavior is normal. Judgment and thought content normal.   He is going to see his urologist for followup on the low testosterone and this winter therefore we will defer a rectal exam and prostate exam to urology's       Assessment & Plan:  Healthy male  Hypertension continue Tenormin Maxzide and aspirin daily  Low testosterone followed by Dr.bg ,,,,,,,, urology  Erectile dysfunction continue Viagra when necessary

## 2013-04-04 ENCOUNTER — Encounter: Payer: Self-pay | Admitting: Internal Medicine

## 2013-11-08 ENCOUNTER — Telehealth: Payer: Self-pay | Admitting: Family Medicine

## 2013-11-08 NOTE — Telephone Encounter (Signed)
Pt has back pain, like a pinched nerve. Doesn't want to wait until his cpe appt on 12/17, even though this issue has been going on for years, but has gotten worse over the last couple of months.

## 2013-11-08 NOTE — Telephone Encounter (Signed)
Left message on machine for patient to schedule an appointment tomorrow with a different provider or with Dr Tawanna Cooler next week

## 2013-11-09 ENCOUNTER — Telehealth: Payer: Self-pay | Admitting: Family Medicine

## 2013-11-09 DIAGNOSIS — M1990 Unspecified osteoarthritis, unspecified site: Secondary | ICD-10-CM

## 2013-11-09 NOTE — Telephone Encounter (Signed)
Patient is calling because he has nerve damage or a pinched nerve.  He has had a history of this in the past.  He would like to know if he should come in for an office visit or go to a neurologist? If he needs an office visit then he will need a morning or early afternoon because he works evenings.

## 2013-11-12 NOTE — Telephone Encounter (Signed)
Left message on machine for patient to go to neurology per Dr Tawanna Cooler

## 2013-12-07 ENCOUNTER — Ambulatory Visit (INDEPENDENT_AMBULATORY_CARE_PROVIDER_SITE_OTHER): Payer: Medicare Other

## 2013-12-07 ENCOUNTER — Other Ambulatory Visit (INDEPENDENT_AMBULATORY_CARE_PROVIDER_SITE_OTHER): Payer: Medicare Other

## 2013-12-07 DIAGNOSIS — Z0000 Encounter for general adult medical examination without abnormal findings: Secondary | ICD-10-CM

## 2013-12-07 DIAGNOSIS — Z23 Encounter for immunization: Secondary | ICD-10-CM

## 2013-12-07 DIAGNOSIS — I10 Essential (primary) hypertension: Secondary | ICD-10-CM

## 2013-12-07 LAB — LIPID PANEL
Cholesterol: 160 mg/dL (ref 0–200)
LDL Cholesterol: 97 mg/dL (ref 0–99)
Total CHOL/HDL Ratio: 4

## 2013-12-07 LAB — POCT URINALYSIS DIPSTICK
Glucose, UA: NEGATIVE
Ketones, UA: NEGATIVE
Leukocytes, UA: NEGATIVE
Nitrite, UA: NEGATIVE
Protein, UA: NEGATIVE
Urobilinogen, UA: 1
pH, UA: 6.5

## 2013-12-07 LAB — CBC WITH DIFFERENTIAL/PLATELET
Basophils Relative: 0.4 % (ref 0.0–3.0)
Eosinophils Absolute: 0.3 10*3/uL (ref 0.0–0.7)
HCT: 43.3 % (ref 39.0–52.0)
Hemoglobin: 14.8 g/dL (ref 13.0–17.0)
Lymphocytes Relative: 22 % (ref 12.0–46.0)
Lymphs Abs: 1.2 10*3/uL (ref 0.7–4.0)
MCHC: 34.2 g/dL (ref 30.0–36.0)
MCV: 87.9 fl (ref 78.0–100.0)
Monocytes Absolute: 0.3 10*3/uL (ref 0.1–1.0)
Neutro Abs: 3.7 10*3/uL (ref 1.4–7.7)
RBC: 4.93 Mil/uL (ref 4.22–5.81)
RDW: 13 % (ref 11.5–14.6)

## 2013-12-07 LAB — BASIC METABOLIC PANEL
BUN: 24 mg/dL — ABNORMAL HIGH (ref 6–23)
Calcium: 9.2 mg/dL (ref 8.4–10.5)
Chloride: 100 mEq/L (ref 96–112)
Creatinine, Ser: 1.4 mg/dL (ref 0.4–1.5)

## 2013-12-07 LAB — HEPATIC FUNCTION PANEL
Albumin: 4.5 g/dL (ref 3.5–5.2)
Total Protein: 7.4 g/dL (ref 6.0–8.3)

## 2013-12-07 LAB — TSH: TSH: 1.96 u[IU]/mL (ref 0.35–5.50)

## 2013-12-07 LAB — PSA: PSA: 3.28 ng/mL (ref 0.10–4.00)

## 2013-12-11 ENCOUNTER — Encounter: Payer: Self-pay | Admitting: Neurology

## 2013-12-11 ENCOUNTER — Ambulatory Visit (INDEPENDENT_AMBULATORY_CARE_PROVIDER_SITE_OTHER): Payer: Medicare Other | Admitting: Neurology

## 2013-12-11 VITALS — BP 122/78 | HR 76 | Temp 97.6°F | Ht 72.0 in | Wt 200.0 lb

## 2013-12-11 DIAGNOSIS — M542 Cervicalgia: Secondary | ICD-10-CM

## 2013-12-11 DIAGNOSIS — R209 Unspecified disturbances of skin sensation: Secondary | ICD-10-CM

## 2013-12-11 NOTE — Patient Instructions (Signed)
1.  EMG of the arms 2.  Return to clinic in 68-month to discuss results

## 2013-12-11 NOTE — Progress Notes (Signed)
[facility] Clinic Note - Initial Visit   Date: 12/11/2013    Antonio Newton MRN: [id number] DOB: [birth year]   Dear Dr Tawanna Cooler:  Thank you for your kind referral of Antonio Newton for consultation of back pain. Although his history is well known to you, please allow [place] to reiterate it for the purpose of our medical record. The patient was accompanied to the clinic by self.   History of Present Illness: Antonio Newton is a 67 y.o. right-handed African American male with history of hypertension, erectile dysfunction, and OA presenting for evaluation of numbness/tingling of his hands.  For at least the past 10 years, he reports having intermittent numbness/tingling of his hands (right > left) which is worse at night and when he is using his hands more (gardening, cutting grass). Symptoms are worse over the thumb and first two fingers.  He feels as if his nerve is being pinched.  If he stands upright and flexes his neck, he feels as if something is being pulled in the back of his neck.  Denies any shooting sensation from his neck into his hands, weakness of his hands, or neck pain.  He denies sensory symptoms of his feet.    Of note, he reports to doing a significant amount of typing and sleeping with wrists flexed.   Out-side paper records, electronic medical record, and images have been reviewed where available and summarized as: Un Lab Results  Component Value Date   TSH 1.96 12/07/2013   Lumbar spine x-ray 12/28/2007:  Degenerative disc disease at L5-S1 with degenerative change involving the facet joints of L5-S1 as well.   Past Medical History  Diagnosis Date  . Hypertension   . ED (erectile dysfunction)   . OA (osteoarthritis)     left hip    No past surgical history on file.   Medications:  Current Outpatient Prescriptions on File Prior to Visit  Medication Sig Dispense Refill  . ANDROGEL PUMP 20.25 MG/ACT (1.62%) GEL       . aspirin  81 MG tablet Take 81 mg by mouth daily.        Marland Kitchen atenolol (TENORMIN) 100 MG tablet Take 1 tablet (100 mg total) by mouth daily.  100 tablet  3  . calcium-vitamin D 250-100 MG-UNIT per tablet Take 1 tablet by mouth daily.        . fish oil-omega-3 fatty acids 1000 MG capsule Take 2 g by mouth daily.        . multivitamin (THERAGRAN) per tablet Take 1 tablet by mouth daily.        . sildenafil (VIAGRA) 100 MG tablet Take 1 tablet (100 mg total) by mouth daily as needed.  10 tablet  11  . triamterene-hydrochlorothiazide (MAXZIDE) 75-50 MG per tablet Take 1 tablet by mouth daily.  100 tablet  3   No current facility-administered medications on file prior to visit.    Allergies:  Allergies  Allergen Reactions  . Penicillins     REACTION: family hx    Family History: Family History  Problem Relation Age of Onset  . Hypertension Father   . Cancer Other     prostate    Social History: History   Social History  . Marital Status: Married    Spouse Name: N/A    Number of Children: N/A  . Years of Education: N/A   Occupational History  . Not on file.   Social History Main Topics  . Smoking  status: Never Smoker   . Smokeless tobacco: Not on file  . Alcohol Use:   . Drug Use:   . Sexual Activity:    Other Topics Concern  . Not on file   Social History Narrative  . No narrative on file    Review of Systems:  CONSTITUTIONAL: No fevers, chills, night sweats, or weight loss.   EYES: No visual changes or eye pain ENT: No hearing changes.  No history of nose bleeds.   RESPIRATORY: No cough, wheezing and shortness of breath.   CARDIOVASCULAR: Negative for chest pain, and palpitations.   GI: Negative for abdominal discomfort, blood in stools or black stools.  No recent change in bowel habits.   GU:  No history of incontinence.   MUSCLOSKELETAL: No history of joint pain or swelling.  No myalgias.   SKIN: Negative for lesions, rash, and itching.   HEMATOLOGY/ONCOLOGY: Negative  for prolonged bleeding, bruising easily, and swollen nodes.  No history of cancer.   ENDOCRINE: Negative for cold or heat intolerance, polydipsia or goiter.   PSYCH:  No depression or anxiety symptoms.   NEURO: As Above.   Vital Signs:  BP 122/78  Pulse 76  Temp(Src) 97.6 F (36.4 C) (Oral)  Ht 6' (1.829 m)  Wt 200 lb (90.719 kg)  BMI 27.12 kg/m2 Pain Scale: 0 on a scale of 0-10   General Medical Exam:   General:  Well appearing, comfortable.   Eyes/ENT: see cranial nerve examination.   Neck: No masses appreciated.  Full range of motion without tenderness. Respiratory:  Clear to auscultation, good air entry bilaterally.   Cardiac:  Regular rate and rhythm, no murmur.    Back:  No pain to palpation of spinous processes.   Extremities:  No deformities, edema, or skin discoloration.  Skin:  Skin color, texture, turgor normal. No rashes or lesions.  Neurological Exam: MENTAL STATUS including orientation to time, place, person, recent and remote memory, attention span and concentration, language, and fund of knowledge is normal.  Speech is not dysarthric.  CRANIAL NERVES: II:  No visual field defects.  Unremarkable fundi.   III-IV-VI: Pupils equal round and reactive to light.  Normal conjugate, extra-ocular eye movements in all directions of gaze.  No nystagmus.  No ptosis.  V:  Normal facial sensation.  VII:  Normal facial symmetry and movements.   VIII:  Normal hearing and vestibular function.   IX-X:  Normal palatal movement.   XI:  Normal shoulder shrug and head rotation.   XII:  Normal tongue strength and range of motion, no deviation or fasciculation.  MOTOR:  No atrophy, fasciculations or abnormal movements.  No pronator drift.  Tone is normal.  Negative Tinel's at the wrist.  Right Upper Extremity:    Left Upper Extremity:    Deltoid  5/5   Deltoid  5/5   Biceps  5/5   Biceps  5/5   Triceps  5/5   Triceps  5/5   Wrist extensors  5/5   Wrist extensors  5/5   Wrist  flexors  5/5   Wrist flexors  5/5   Finger extensors  5/5   Finger extensors  5/5   Finger flexors  5/5   Finger flexors  5/5   Dorsal interossei  5/5   Dorsal interossei  5/5   Abductor pollicis  5/5   Abductor pollicis  5/5   Tone (Ashworth scale)  0  Tone (Ashworth scale)  0   Right Lower Extremity:  Left Lower Extremity:    Hip flexors  5/5   Hip flexors  5/5   Hip extensors  5/5   Hip extensors  5/5   Knee flexors  5/5   Knee flexors  5/5   Knee extensors  5/5   Knee extensors  5/5   Dorsiflexors  5/5   Dorsiflexors  5/5   Plantarflexors  5/5   Plantarflexors  5/5   Toe extensors  5/5   Toe extensors  5/5   Toe flexors  5/5   Toe flexors  5/5   Tone (Ashworth scale)  0  Tone (Ashworth scale)  0   MSRs:  Right                                                                 Left brachioradialis 2+  brachioradialis 2+  biceps 2+  biceps 2+  triceps 2+  triceps 2+  patellar 3+  patellar 3+  ankle jerk 2+  ankle jerk 2+  Hoffman no  Hoffman no  plantar response down  plantar response down  1-2 beat ankle clonus  SENSORY:  Absent vibration at the great toe bilaterally, otherwise normal and symmetric perception of light touch, pinprick, and proprioception.  Romberg's sign absent.   COORDINATION/GAIT: Normal finger-to- nose-finger and heel-to-shin.  Intact rapid alternating movements bilaterally.  Able to rise from a chair without using arms.  Gait narrow based and stable. Tandem and stressed gait intact.     IMPRESSION: Mr. Pease is a 67 year-old gentleman presenting for evaluation of bilateral hand paresthesias.  His neurological exam is notable for brisk and symmetric patella jerks.  In the absence of other upper motor neuron findings, this may be normal for patient.  There is no sensory or motor deficits in the hands. EMG will be ordered to help differentiate whether his upper extremity symptoms is due to carpal tunnel syndrome vs cervical radiculopathy. Based on his  clinical history, I suspect he may have CTS.   PLAN/RECOMMENDATIONS:  1.  EMG of bilateral upper extremities 2.  Return to clinic in 63-month   The duration of this appointment visit was 45 minutes of face-to-face time with the patient.  Greater than 50% of this time was spent in counseling, explanation of diagnosis, planning of further management, and coordination of care.   Thank you for allowing me to participate in patient's care.  If I can answer any additional questions, I would be pleased to do so.    Sincerely,    Donika K. Allena Katz, DO

## 2013-12-12 ENCOUNTER — Ambulatory Visit (INDEPENDENT_AMBULATORY_CARE_PROVIDER_SITE_OTHER): Payer: Medicare Other | Admitting: Family Medicine

## 2013-12-12 ENCOUNTER — Encounter: Payer: Self-pay | Admitting: Family Medicine

## 2013-12-12 VITALS — BP 140/90 | Temp 98.3°F | Ht 71.0 in | Wt 202.0 lb

## 2013-12-12 DIAGNOSIS — M1990 Unspecified osteoarthritis, unspecified site: Secondary | ICD-10-CM

## 2013-12-12 DIAGNOSIS — E291 Testicular hypofunction: Secondary | ICD-10-CM

## 2013-12-12 DIAGNOSIS — R7989 Other specified abnormal findings of blood chemistry: Secondary | ICD-10-CM

## 2013-12-12 DIAGNOSIS — Z23 Encounter for immunization: Secondary | ICD-10-CM

## 2013-12-12 DIAGNOSIS — I10 Essential (primary) hypertension: Secondary | ICD-10-CM

## 2013-12-12 DIAGNOSIS — F528 Other sexual dysfunction not due to a substance or known physiological condition: Secondary | ICD-10-CM

## 2013-12-12 MED ORDER — LISINOPRIL-HYDROCHLOROTHIAZIDE 20-12.5 MG PO TABS: 1.0000 | ORAL_TABLET | Freq: Every day | ORAL | Status: DC

## 2013-12-12 MED ORDER — SILDENAFIL CITRATE 100 MG PO TABS: 100.0000 mg | ORAL_TABLET | Freq: Every day | ORAL | Status: DC | PRN

## 2013-12-12 NOTE — Progress Notes (Signed)
   Subjective:    Antonio [facility] ID: Antonio Newton, male    DOB: June 30, 1946, 67 y.o.   MRN: [id number]  HPI Mr. Antonio Newton is a delightful 67 year old married male nonsmoker who comes in today for a Medicare wellness examination because of a history of hypertension, erectile dysfunction, low testosterone  He's tried the Viagra 100 mg along with the testosterone gel from his urologist. He wants to know if the atenolol might be causing a decrease in his sexual function. I explained to him that is a possibility we will switch to another blood pressure lying dry  He gets routine eye care, dental care, colonoscopy and GI, vaccinations up-to-date  Cognitive function normal he walks on a regular basis home health safety reviewed no issues identified, no guns in the house, he does have a health care power of attorney and living well   Review of Systems  Constitutional: Negative.   HENT: Negative.   Eyes: Negative.   Respiratory: Negative.   Cardiovascular: Negative.   Gastrointestinal: Negative.   Endocrine: Negative.   Genitourinary: Negative.   Musculoskeletal: Negative.   Skin: Negative.   Allergic/Immunologic: Negative.   Neurological: Negative.   Hematological: Negative.   Psychiatric/Behavioral: Negative.        Objective:   Physical Exam  Nursing note and vitals reviewed. Constitutional: He is oriented to person, place, and time. He appears well-developed and well-nourished.  HENT:  Head: Normocephalic and atraumatic.  Right Ear: External ear normal.  Left Ear: External ear normal.  Nose: Nose normal.  Mouth/Throat: Oropharynx is clear and moist.  Eyes: Conjunctivae and EOM are normal. Pupils are equal, round, and reactive to light.  Neck: Normal range of motion. Neck supple. No JVD present. No tracheal deviation present. No thyromegaly present.  Cardiovascular: Normal rate, regular rhythm, normal heart sounds and intact distal pulses.  Exam reveals no gallop and no friction  rub.   No murmur heard. Pulmonary/Chest: Effort normal and breath sounds normal. No stridor. No respiratory distress. He has no wheezes. He has no rales. He exhibits no tenderness.  Abdominal: Soft. Bowel sounds are normal. He exhibits no distension and no mass. There is no tenderness. There is no rebound and no guarding.  Genitourinary:  Urologic evaluation by urologist therefore not repeated  Musculoskeletal: Normal range of motion. He exhibits no edema and no tenderness.  Lymphadenopathy:    He has no cervical adenopathy.  Neurological: He is alert and oriented to person, place, and time. He has normal reflexes. No cranial nerve deficit. He exhibits normal muscle tone.  Skin: Skin is warm and dry. No rash noted. No erythema. No pallor.  Psychiatric: He has a normal mood and affect. His behavior is normal. Judgment and thought content normal.          Assessment & Plan:  Healthy male  History of low testosterone followup by Dr. Myriam Jacobson in urology  Hypertension.......Marland Kitchen DC 810 along,,,,,,,, start ACE inhibitor to see if it beta blocker was having a negative effect on his sex life EP check daily followup in 4 weeks  Erectile dysfunction continue Viagra  ,

## 2013-12-12 NOTE — Progress Notes (Signed)
Pre visit review using our clinic review tool, if applicable. No additional management support is needed unless otherwise documented below in the visit note. 

## 2013-12-12 NOTE — Patient Instructions (Signed)
Stop the atenolol and Maxide  Started Zestoretic 20-12.5.......... one tablet daily in the morning for high blood pressure  Check your blood pressure daily in the morning  Return in 3 weeks for followup  When you return and bring a copy of all your blood pressure readings and the device  [email or web address] for the Viagra

## 2013-12-12 NOTE — Addendum Note (Signed)
Addended by: Kern Reap B on: 12/12/2013 12:55 PM   Modules accepted: Orders

## 2014-01-03 ENCOUNTER — Encounter: Payer: Self-pay | Admitting: Family Medicine

## 2014-01-10 ENCOUNTER — Encounter: Payer: Self-pay | Admitting: Family Medicine

## 2014-01-10 ENCOUNTER — Ambulatory Visit (INDEPENDENT_AMBULATORY_CARE_PROVIDER_SITE_OTHER): Payer: Medicare Other | Admitting: Family Medicine

## 2014-01-10 VITALS — BP 160/100 | Temp 98.7°F | Wt 200.0 lb

## 2014-01-10 DIAGNOSIS — I10 Essential (primary) hypertension: Secondary | ICD-10-CM

## 2014-01-10 MED ORDER — LOSARTAN POTASSIUM-HCTZ 100-12.5 MG PO TABS: 1.0000 | ORAL_TABLET | Freq: Every day | ORAL | Status: DC

## 2014-01-10 NOTE — Progress Notes (Signed)
Pre visit review using our clinic review tool, if applicable. No additional management support is needed unless otherwise documented below in the visit note. 

## 2014-01-10 NOTE — Progress Notes (Signed)
   Subjective:    Patient ID: [facility], male    DOB: January 18, 1946, 68 y.o.   MRN: [id number]  HPI Antonio Newton is a 68 year old male who comes in today for followup of hypertension.Marland Kitchen His BP is 170/90 right arm sitting position on the Zestoretic 20/12.5 and he 7 a cough from the medication   Review of Systems    review of systems otherwise negative Objective:   Physical Exam  Well-developed well-nourished male no acute distress BP right arm sitting position 170/90 pulse 70 and regular      Assessment & Plan:  Hypertension not at goal,,,,,,,,,, cough from ACE inhibitor,,,,,, switch to Hyzaar monitor blood pressure return in 3 weeks

## 2014-01-10 NOTE — Patient Instructions (Signed)
Stop the Zestoretic  Begin Hyzaar,,,,,,,, one tablet daily in the morning  Check your blood pressure daily in the morning  Return in 3 weeks for followup

## 2014-01-11 ENCOUNTER — Encounter (HOSPITAL_COMMUNITY): Payer: Self-pay | Admitting: Emergency Medicine

## 2014-01-11 ENCOUNTER — Emergency Department (INDEPENDENT_AMBULATORY_CARE_PROVIDER_SITE_OTHER)
Admission: EM | Admit: 2014-01-11 | Discharge: 2014-01-11 | Disposition: A | Discharge status: Home / Self Care | Payer: Medicare Other | Source: Home / Self Care

## 2014-01-11 ENCOUNTER — Telehealth: Payer: Self-pay | Admitting: *Deleted

## 2014-01-11 DIAGNOSIS — I10 Essential (primary) hypertension: Secondary | ICD-10-CM

## 2014-01-11 MED ORDER — ACETAMINOPHEN 325 MG PO TABS
ORAL_TABLET | ORAL | Status: AC
Start: 2014-01-11 — End: 2014-01-11
  Filled 2014-01-11: qty 2

## 2014-01-11 MED ORDER — CLONIDINE HCL 0.1 MG PO TABS
ORAL_TABLET | ORAL | Status: AC
  Filled 2014-01-11: qty 1

## 2014-01-11 MED ORDER — CLONIDINE HCL 0.1 MG PO TABS
0.1000 mg | ORAL_TABLET | Freq: Once | ORAL | Status: AC
  Administered 2014-01-11: 0.1 mg via ORAL

## 2014-01-11 MED ORDER — ACETAMINOPHEN 325 MG PO TABS
650.0000 mg | ORAL_TABLET | Freq: Once | ORAL | Status: AC
  Administered 2014-01-11: 650 mg via ORAL

## 2014-01-11 NOTE — ED Notes (Signed)
BP at 1822 is approx what pt states he has been getting all day today on his BP machine at home - pt provided readings he has recorded today.

## 2014-01-11 NOTE — ED Provider Notes (Signed)
CSN: [id number]     Arrival date & time 01/11/14  1649 History   None    Chief Complaint  Patient presents with  . Hypertension   (Consider location/radiation/quality/duration/timing/severity/associated sxs/prior Treatment) HPI Comments: Patient presents with elevated BP's over the last week. He was recently changed from Lisin-HCTZ to Hyzaar 2 days ago and reports that is BP's are still high. He was on Atenolol for years but this was stopped over a month ago, as questioned causing ED. He has a mild headache. No change in vision and no dizziness. No chest pain or SOB.   Patient is a 68 y.o. male presenting with hypertension. The history is provided by the patient.  Hypertension Associated symptoms include headaches.    Past Medical History  Diagnosis Date  . Hypertension   . ED (erectile dysfunction)   . OA (osteoarthritis)     left hip   Past Surgical History  Procedure Laterality Date  . None     Family History  Problem Relation Age of Onset  . Hypertension Father     Deceased, 32  . Hypertension Mother     Deceased, 4  . Healthy Sister    History  Substance Use Topics  . Smoking status: Never Smoker   . Smokeless tobacco: Not on file  . Alcohol Use: Yes     Comment: Occasionally (few times per month)    Review of Systems  Constitutional: Negative for fatigue.  Eyes: Negative for pain and visual disturbance.  Respiratory: Negative.   Cardiovascular: Negative.   Skin: Negative.   Neurological: Positive for headaches. Negative for dizziness, tremors, seizures, speech difficulty, weakness, light-headedness and numbness.  Psychiatric/Behavioral: Negative.     Allergies  Penicillins  Home Medications   Current Outpatient Rx  Name  Route  Sig  Dispense  Refill  . ANDROGEL PUMP 20.25 MG/ACT (1.62%) GEL               . aspirin 81 MG tablet   Oral   Take 81 mg by mouth daily.           . calcium-vitamin D 250-100 MG-UNIT per tablet   Oral   Take 1  tablet by mouth daily.           . fish oil-omega-3 fatty acids 1000 MG capsule   Oral   Take 2 g by mouth daily.           Marland Kitchen losartan-hydrochlorothiazide (HYZAAR) 100-12.5 MG per tablet   Oral   Take 1 tablet by mouth daily.   90 tablet   3   . multivitamin (THERAGRAN) per tablet   Oral   Take 1 tablet by mouth daily.           . sildenafil (VIAGRA) 100 MG tablet   Oral   Take 1 tablet (100 mg total) by mouth daily as needed.   10 tablet   11    BP 160/90  Pulse 72  Temp(Src) 98.2 F (36.8 C) (Oral)  Resp 16  SpO2 99% Physical Exam  Nursing note and vitals reviewed. Constitutional: He is oriented to person, place, and time. He appears well-developed and well-nourished. No distress.  HENT:  Head: Normocephalic and atraumatic.  Eyes: EOM are normal. Pupils are equal, round, and reactive to light. No scleral icterus.  Neck: Normal range of motion.  Pulmonary/Chest: Effort normal and breath sounds normal. No respiratory distress. He has no rales. He exhibits no tenderness.  Musculoskeletal: Normal range of motion.  Neurological: He is alert and oriented to person, place, and time.  Skin: Skin is warm and dry. He is not diaphoretic.  Psychiatric: His behavior is normal.    ED Course  Procedures (including critical care time) Labs Review Labs Reviewed - No data to display Imaging Review No results found.  EKG Interpretation    Date/Time:    Ventricular Rate:    PR Interval:    QRS Duration:   QT Interval:    QTC Calculation:   R Axis:     Text Interpretation:              MDM   1. HTN (hypertension)    Clonidine given to lower BP, Recheck 160/90. Asymptomatic with this. Headache improved. Instructed to resume 50 mg Atenolol until f/u with PCP,  Educated on worrisome symptoms.    Bjorn Pippin, PA-C 01/11/14 2213

## 2014-01-11 NOTE — Discharge Instructions (Signed)
Hypertension As your heart beats, it forces blood through your arteries. This force is your blood pressure. If the pressure is too high, it is called hypertension (HTN) or high blood pressure. HTN is dangerous because you may have it and not know it. High blood pressure may mean that your heart has to work harder to pump blood. Your arteries may be narrow or stiff. The extra work puts you at risk for heart disease, stroke, and other problems.  Blood pressure consists of two numbers, a higher number over a lower, 110/72, for example. It is stated as "110 over 72." The ideal is below 120 for the top number (systolic) and under 80 for the bottom (diastolic). Write down your blood pressure today. You should pay close attention to your blood pressure if you have certain conditions such as:  Heart failure.  Prior heart attack.  Diabetes  Chronic kidney disease.  Prior stroke.  Multiple risk factors for heart disease. To see if you have HTN, your blood pressure should be measured while you are seated with your arm held at the level of the heart. It should be measured at least twice. A one-time elevated blood pressure reading (especially in the Emergency Department) does not mean that you need treatment. There may be conditions in which the blood pressure is different between your right and left arms. It is important to see your caregiver soon for a recheck. Most people have essential hypertension which means that there is not a specific cause. This type of high blood pressure may be lowered by changing lifestyle factors such as:  Stress.  Smoking.  Lack of exercise.  Excessive weight.  Drug/tobacco/alcohol use.  Eating less salt. Most people do not have symptoms from high blood pressure until it has caused damage to the body. Effective treatment can often prevent, delay or reduce that damage. TREATMENT  When a cause has been identified, treatment for high blood pressure is directed at the  cause. There are a large number of medications to treat HTN. These fall into several categories, and your caregiver will help you select the medicines that are best for you. Medications may have side effects. You should review side effects with your caregiver. If your blood pressure stays high after you have made lifestyle changes or started on medicines,   Your medication(s) may need to be changed.  Other problems may need to be addressed.  Be certain you understand your prescriptions, and know how and when to take your medicine.  Be sure to follow up with your caregiver within the time frame advised (usually within two weeks) to have your blood pressure rechecked and to review your medications.  If you are taking more than one medicine to lower your blood pressure, make sure you know how and at what times they should be taken. Taking two medicines at the same time can result in blood pressure that is too low. SEEK IMMEDIATE MEDICAL CARE IF:  You develop a severe headache, blurred or changing vision, or confusion.  You have unusual weakness or numbness, or a faint feeling.  You have severe chest or abdominal pain, vomiting, or breathing problems. MAKE SURE YOU:   Understand these instructions.  Will watch your condition.  Will get help right away if you are not doing well or get worse. Document Released: 12/13/2005 Document Revised: 03/06/2012 Document Reviewed: 08/02/2008 [facility] Patient Information 2014 [place].   Avoid Excedrin or NSAIDs with high BP's. May take Tylenol for headaches.  Watch  your salt intake. Restart Atenolol at 50mg  a day (half tablet), and plan adequate f/u with PCP within a week to tweak meds. Should you have dizziness, chest pain or SOB then please do not hesitate to present to the ER.

## 2014-01-11 NOTE — ED Notes (Signed)
See PA's assessment. 

## 2014-01-11 NOTE — Telephone Encounter (Signed)
Patient calling because his blood pressure has increased and he has a headache.  I advised patient that Dr Sherren Mocha is not in the office today and all of our providers are full for today.  Advised patient to go to urgent care.  Patient refused but did make an appointment for Saturday [facility].  Again I advised the patient to go Urgent Care.

## 2014-01-12 ENCOUNTER — Ambulatory Visit: Payer: Medicare Other | Admitting: Endocrinology

## 2014-01-12 DIAGNOSIS — Z0289 Encounter for other administrative examinations: Secondary | ICD-10-CM

## 2014-01-14 ENCOUNTER — Encounter: Payer: Medicare Other | Admitting: Neurology

## 2014-01-14 ENCOUNTER — Telehealth: Payer: Self-pay | Admitting: Neurology

## 2014-01-14 NOTE — Telephone Encounter (Signed)
Noted.  Donika K. Patel, DO   

## 2014-01-14 NOTE — Telephone Encounter (Signed)
PATIENT CALLED AND CANCELED APPT AT 1:16 TODAY AND RESCH Antonio Newton

## 2014-01-15 ENCOUNTER — Encounter: Payer: Self-pay | Admitting: Family Medicine

## 2014-01-15 NOTE — ED Provider Notes (Signed)
Medical screening examination/treatment/procedure(s) were performed by resident physician or non-physician practitioner and as supervising physician I was immediately available for consultation/collaboration.   Pauline Good MD.   Billy Fischer, MD 01/15/14 1359

## 2014-01-18 ENCOUNTER — Ambulatory Visit: Payer: Medicare Other | Admitting: Neurology

## 2014-01-30 ENCOUNTER — Telehealth: Payer: Self-pay | Admitting: Family Medicine

## 2014-01-30 NOTE — Telephone Encounter (Signed)
Relevant patient education assigned to patient using Emmi. ° °

## 2014-02-05 ENCOUNTER — Ambulatory Visit (INDEPENDENT_AMBULATORY_CARE_PROVIDER_SITE_OTHER): Payer: Medicare Other | Admitting: Family Medicine

## 2014-02-05 ENCOUNTER — Encounter: Payer: Self-pay | Admitting: Family Medicine

## 2014-02-05 VITALS — BP 180/100 | Temp 98.5°F | Wt 200.0 lb

## 2014-02-05 DIAGNOSIS — T464X5A Adverse effect of angiotensin-converting-enzyme inhibitors, initial encounter: Secondary | ICD-10-CM

## 2014-02-05 DIAGNOSIS — R058 Other specified cough: Secondary | ICD-10-CM | POA: Insufficient documentation

## 2014-02-05 DIAGNOSIS — I10 Essential (primary) hypertension: Secondary | ICD-10-CM

## 2014-02-05 DIAGNOSIS — R05 Cough: Secondary | ICD-10-CM

## 2014-02-05 DIAGNOSIS — T465X5A Adverse effect of other antihypertensive drugs, initial encounter: Secondary | ICD-10-CM

## 2014-02-05 DIAGNOSIS — R059 Cough, unspecified: Secondary | ICD-10-CM

## 2014-02-05 MED ORDER — AMLODIPINE BESYLATE 10 MG PO TABS
10.0000 mg | ORAL_TABLET | Freq: Every day | ORAL | Status: DC
Start: 2014-02-05 — End: 2014-06-25

## 2014-02-05 NOTE — Patient Instructions (Signed)
Continue the Hyzaar one tablet daily in the morning  Add Norvasc......Marland Kitchen 10 mg tablet..........Marland Kitchen 1 now................. then starting tomorrow morning one of each every morning  Check your blood pressure daily in the morning  Return in one month for followup

## 2014-02-05 NOTE — Progress Notes (Signed)
   Subjective:    Patient ID: Antonio Newton, male    DOB: [birth year], 68 y.o.   MRN: [id number]  HPI Antonio Newton is a 68 year old male who comes in today for followup of hypertension  We really have him on an ACE inhibitor however he developed a severe cough  We stopped that and started Hyzaar 100-12.5 daily that did not control his blood pressure. We therefore added Tenormin 50 mg daily. This did not cause a decrease in his blood pressure but it does cause erectile dysfunction   Review of Systems    review of systems negative Objective:   Physical Exam Well-developed well-nourished male no acute distress vital signs he was afebrile BP 180/100       Assessment & Plan:  Hypertension not at goal continue Hyzaar add Norvasc

## 2014-02-05 NOTE — Progress Notes (Signed)
Pre visit review using our clinic review tool, if applicable. No additional management support is needed unless otherwise documented below in the visit note. 

## 2014-02-06 ENCOUNTER — Telehealth: Payer: Self-pay | Admitting: Family Medicine

## 2014-02-06 NOTE — Telephone Encounter (Signed)
Relevant patient education assigned to patient using Emmi. ° °

## 2014-02-20 ENCOUNTER — Ambulatory Visit (INDEPENDENT_AMBULATORY_CARE_PROVIDER_SITE_OTHER): Payer: Medicare Other | Admitting: Neurology

## 2014-02-20 ENCOUNTER — Encounter: Payer: Medicare Other | Admitting: Neurology

## 2014-02-20 ENCOUNTER — Encounter: Payer: Self-pay | Admitting: Neurology

## 2014-02-20 DIAGNOSIS — G5600 Carpal tunnel syndrome, unspecified upper limb: Secondary | ICD-10-CM

## 2014-02-20 DIAGNOSIS — G5622 Lesion of ulnar nerve, left upper limb: Secondary | ICD-10-CM

## 2014-02-20 DIAGNOSIS — G5620 Lesion of ulnar nerve, unspecified upper limb: Secondary | ICD-10-CM

## 2014-02-20 DIAGNOSIS — M5412 Radiculopathy, cervical region: Secondary | ICD-10-CM

## 2014-02-20 NOTE — Procedures (Signed)
[facility] Neurology  [place], [place]  [place],  [address] Tel: [phone] Fax:  [phone] Test Date:  02/20/2014  Patient: Antonio Newton DOB: 02-14-1946 Physician: Narda Amber, DO  Sex: Male Height: 6' " Ref Phys: Narda Amber  ID#: [id number] Temp: 33.0C Technician:    Patient Complaints: This is a 68 year-old gentleman presenting with bilateral paresthesias.  NCV & EMG Findings: Extensive electrodiagnostic evaluation of the right and left upper extremity reveals: 1. The right median/ulnar (palm) comparison nerve showed prolonged distal peak latency (Median Palm, 2.3 ms).  Bilateral median, ulnar, and right radial sensory responses are normal.  Left palmer studies are also normal. 2. Left ulnar motor nerve showed decreased conduction velocity (A Elbow-B Elbow, 29 m/s) with normal latency and amplitude.  Bilateral median and right ulnar motor responses are normal. 3. Chronic motor axon loss changes are seen affecting the right C7-C8 myotomes without accompanied active changes.  Motor unit recruitment and morphology is normal in the muscles tested on the left side.  Impression: 1. Right median neuropathy, at or distal to the wrist, consistent with the clinical diagnosis of carpal tunnel syndrome; very mild in degree electrically. 2. Right intraspinal canal lesion (i.e. radiculopathy) affecting the right C7-C8 nerve root/segments; overall, these findings are mild in degree electrically. 3. Left ulnar neuropathy with slowing across the elbow, demyelinating in type. 4. No evidence of a left cervical motor radiculopathy.   ___________________________ Narda Amber, DO    Nerve Conduction Studies Anti Sensory Summary Table   Site NR Peak (ms) Norm Peak (ms) P-T Amp (V) Norm P-T Amp  Left Median Anti Sensory (2nd Digit)  Wrist    3.2 <3.8 31.6 >10  Right Median Anti Sensory (2nd Digit)  Wrist    3.6 <3.8 21.8 >10  Right Radial Anti Sensory (Base 1st  Digit)  Wrist    2.7 <2.8 15.0 >10  Left Ulnar Anti Sensory (5th Digit)  Wrist    3.2 <3.2 21.7 >5  Right Ulnar Anti Sensory (5th Digit)  Wrist    3.1 <3.2 14.5 >5   Motor Summary Table   Site NR Onset (ms) Norm Onset (ms) O-P Amp (mV) Norm O-P Amp Site1 Site2 Delta-0 (ms) Dist (cm) Vel (m/s) Norm Vel (m/s)  Left Median Motor (Abd Poll Brev)  Wrist    3.8 <4.0 11.9 >5 Elbow Wrist 5.5 30.0 55 >50  Elbow    9.3  11.2         Right Median Motor (Abd Poll Brev)  Wrist    3.9 <4.0 8.7 >5 Elbow Wrist 5.0 30.0 60 >50  Elbow    8.9  8.6         Left Ulnar Motor (Abd Dig Minimi)  Wrist    2.3 <3.1 7.1 >7 B Elbow Wrist 4.4 27.0 61 >50  B Elbow    6.7  4.7  A Elbow B Elbow 3.5 10.0 29 >50  A Elbow    10.2  3.7         Right Ulnar Motor (Abd Dig Minimi)  Wrist    2.3 <3.1 10.1 >7 B Elbow Wrist 4.3 24.0 56 >50  B Elbow    6.6  8.4  A Elbow B Elbow 1.9 10.0 53 >50  A Elbow    8.5  7.9          Comparison Summary Table   Site NR Peak (ms) Norm Peak (ms) P-T Amp (V) Site1 Site2 Delta-P (ms) Norm Delta (  ms)  Left Median/Ulnar Palm Comparison (Wrist - 8cm)  Median Palm    2.1 <2.2 29.3 Median Palm Ulnar Palm 0.3   Ulnar Palm    1.8 <2.2 12.3      Right Median/Ulnar Palm Comparison (Wrist - 8cm)  Median Palm    2.3 <2.2 23.8 Median Palm Ulnar Palm 0.8   Ulnar Palm    1.5 <2.2 15.7       EMG   Side Muscle Ins Act Fibs Psw Fasc Number Recrt Dur Dur. Amp Amp. Poly Poly. Comment  Right 1stDorInt Nml Nml Nml Nml 1- Mod-R Few 1+ Few 1+ Few 1+ N/A  Right Abd Poll Brev Nml Nml Nml Nml Nml Nml Nml Nml Nml Nml Nml Nml N/A  Right FlexPolLong Nml Nml Nml Nml 1- Mod-R Few 1+ Nml Nml Nml Nml N/A  Right Ext Indicis Nml Nml Nml Nml 1- Mod-R Few 1+ Nml Nml Nml Nml N/A  Right PronatorTeres Nml Nml Nml Nml 1- Mod-R Some 1+ Some 1+ Nml Nml N/A  Right Triceps Nml Nml Nml Nml 2- Rapid Some 1+ Some 1+ Few 1+ N/A  Right Biceps Nml Nml Nml Nml Nml Nml Nml Nml Nml Nml Nml Nml N/A  Right Deltoid Nml Nml Nml Nml Nml  Nml Nml Nml Nml Nml Nml Nml N/A  Left 1stDorInt Nml Nml Nml Nml Nml Nml Nml Nml Nml Nml Nml Nml N/A  Left FlexPolLong Nml Nml Nml Nml Nml Nml Nml Nml Nml Nml Nml Nml N/A  Left PronatorTeres Nml Nml Nml Nml Nml Nml Nml Nml Nml Nml Nml Nml N/A  Left Triceps Nml Nml Nml Nml Nml Nml Nml Nml Nml Nml Nml Nml N/A  Left FlexDigProf 4,5 Nml Nml Nml Nml Nml Nml Nml Nml Nml Nml Nml Nml N/A  Left FlexCarpiUln Nml Nml Nml Nml Nml Nml Nml Nml Nml Nml Nml Nml N/A      Waveforms:

## 2014-02-20 NOTE — Progress Notes (Signed)
See procedure note for EMG results.  Donika K. Patel, DO  

## 2014-02-21 ENCOUNTER — Encounter: Payer: Medicare Other | Admitting: Neurology

## 2014-02-28 ENCOUNTER — Ambulatory Visit (INDEPENDENT_AMBULATORY_CARE_PROVIDER_SITE_OTHER): Payer: Medicare Other | Admitting: Neurology

## 2014-02-28 ENCOUNTER — Encounter: Payer: Self-pay | Admitting: Neurology

## 2014-02-28 VITALS — BP 150/98 | HR 77 | Temp 97.7°F | Wt 200.0 lb

## 2014-02-28 DIAGNOSIS — G5620 Lesion of ulnar nerve, unspecified upper limb: Secondary | ICD-10-CM

## 2014-02-28 DIAGNOSIS — G5600 Carpal tunnel syndrome, unspecified upper limb: Secondary | ICD-10-CM

## 2014-02-28 DIAGNOSIS — G5622 Lesion of ulnar nerve, left upper limb: Secondary | ICD-10-CM

## 2014-02-28 DIAGNOSIS — M5412 Radiculopathy, cervical region: Secondary | ICD-10-CM

## 2014-02-28 MED ORDER — GABAPENTIN 100 MG PO CAPS: ORAL_CAPSULE | ORAL | Status: DC

## 2014-02-28 NOTE — Progress Notes (Signed)
Follow-up Visit   Date: 02/28/2014   CHASKE PASKETT MRN: [id number] DOB: 1946-09-02   Interim History: BISHOP VANDERWERF is a 68 y.o. right-handed African American male with history of hypertension, erectile dysfunction, and OA returning to the clinic for follow-up of numbness/tingling of his hands. Patient was last seen in the office on 12/11/2013.  History of present illness: For at least the past 10 years, he reports having intermittent numbness/tingling of his hands (right > left) which is worse at night and when he is using his hands more (gardening, cutting grass). Symptoms are worse over the thumb and first two fingers. He feels as if his nerve is being pinched. If he stands upright and flexes his neck, he feels as if something is being pulled in the back of his neck. Denies any shooting sensation from his neck into his hands, weakness of his hands, or neck pain. He denies sensory symptoms of his feet.  Of note, he reports to doing a significant amount of typing and sleeping with wrists flexed.  - Follow-up 02/28/2014:  No interval change in symptoms.  EMG performed on 02/20/2014 shows mild R CTS, left ulnar neuropathy with slowing across the elbow and mild right C7-8 radiculopathy.    Medications:  Current Outpatient Prescriptions on File Prior to Visit  Medication Sig Dispense Refill  . amLODipine (NORVASC) 10 MG tablet Take 1 tablet (10 mg total) by mouth daily.  90 tablet  3  . ANDROGEL PUMP 20.25 MG/ACT (1.62%) GEL       . aspirin 81 MG tablet Take 81 mg by mouth daily.        Marland Kitchen atenolol (TENORMIN) 100 MG tablet 100 mg. Half tab daily      . calcium-vitamin D 250-100 MG-UNIT per tablet Take 1 tablet by mouth daily.        . fish oil-omega-3 fatty acids 1000 MG capsule Take 2 g by mouth daily.        Marland Kitchen losartan-hydrochlorothiazide (HYZAAR) 100-12.5 MG per tablet Take 1 tablet by mouth daily.  90 tablet  3  . multivitamin (THERAGRAN) per tablet Take 1 tablet by mouth  daily.        . sildenafil (VIAGRA) 100 MG tablet Take 1 tablet (100 mg total) by mouth daily as needed.  10 tablet  11   No current facility-administered medications on file prior to visit.    Allergies:  Allergies  Allergen Reactions  . Penicillins     REACTION: family hx     Review of Systems:  CONSTITUTIONAL: No fevers, chills, night sweats, or weight loss.   EYES: No visual changes or eye pain ENT: No hearing changes.  No history of nose bleeds.   RESPIRATORY: No cough, wheezing and shortness of breath.   CARDIOVASCULAR: Negative for chest pain, and palpitations.   GI: Negative for abdominal discomfort, blood in stools or black stools.  No recent change in bowel habits.   GU:  No history of incontinence.   MUSCLOSKELETAL: No history of joint pain or swelling.  No myalgias.   SKIN: Negative for lesions, rash, and itching.   ENDOCRINE: Negative for cold or heat intolerance, polydipsia or goiter.   PSYCH:  No depression or anxiety symptoms.   NEURO: As Above.   Vital Signs:  BP 150/98  Pulse 77  Temp(Src) 97.7 F (36.5 C)  Wt 200 lb (90.719 kg)  SpO2 97%  Neurological Exam: MENTAL STATUS including orientation to time, place, person, recent and  remote memory, attention span and concentration, language, and fund of knowledge is normal.  Speech is not dysarthric.  CRANIAL NERVES: Pupils equal round and reactive to light.  Face is symmetric.   MOTOR:  Motor strength is 5/5 in all extremities, including intrinsic hand muscles.  No atrophy, fasciculations or abnormal movements.  No pronator drift.  Tone is normal.    MSRs:  Reflexes are 2+/4 throughout, except 3+/4 patella jerks.  SENSORY:  Intact to pin prick in the arms.  Marland Kitchen  COORDINATION/GAIT:  Intact rapid alternating movements bilaterally.  Gait narrow based and stable.   Data: EMG 02/20/2013: 1. Right median neuropathy, at or distal to the wrist, consistent with the clinical diagnosis of carpal tunnel syndrome;  very mild in degree electrically. 2. Right intraspinal canal lesion (i.e. radiculopathy) affecting the right C7-C8 nerve root/segments; overall, these findings are mild in degree electrically. 3. Left ulnar neuropathy with slowing across the elbow, demyelinating in type. 4. No evidence of a left cervical motor radiculopathy.    IMPRESSION/PLAN: Mr. Cardarelli is a 68 year-old gentleman returning to clinic for evaluation of bilateral hand paresthesias.  Symptoms are multifactorial and due to R CTS, R cervical (C7-8) radiculopathy, and left ulnar neuropathy at the elbow.  I discussed the anatomy of the nerves and explained that all of these are mild so will treat conservatively first.    1.  Right carpal tunnel syndrome, very mild  - Recommend using wrist splint for at night  - Avoid hyperflexion at the wrist 2.  Right cervical radiculopathy, mild  - Given absence of neck pain and no myelopathic findings, no need for imaging of the cervical spine for now  - Will follow clinically 3.  Left ulnar neuropathy at the elbow, demyelinating only  - Avoid hyperflexion elbows   - Avoid repetitive trauma to the left elbow 4.  Start neurontin 100mg  at bedtime for one week, then increase to 200mg  at bedtime thereafter for paresthesias 5.  Return 67-months   The duration of this appointment visit was 30 minutes of face-to-face time with the patient.  Greater than 50% of this time was spent in counseling, explanation of diagnosis, planning of further management, and coordination of care.   Thank you for allowing me to participate in patient's care.  If I can answer any additional questions, I would be pleased to do so.    Sincerely,    Tashari Schoenfelder K. Posey Pronto, DO

## 2014-02-28 NOTE — Patient Instructions (Addendum)
1.  Recommend using wrist splint for at night in right hand 2.  Avoid hyperflexion of the wrist and elbows  3.  Avoid repetitive trauma to the left elbow 4.  Start neurontin 100mg  at bedtime for one week, then increase to 200mg  at bedtime thereafter 5.  Return 31-months

## 2014-03-05 ENCOUNTER — Encounter: Payer: Self-pay | Admitting: Family Medicine

## 2014-03-05 ENCOUNTER — Ambulatory Visit (INDEPENDENT_AMBULATORY_CARE_PROVIDER_SITE_OTHER): Payer: Medicare Other | Admitting: Family Medicine

## 2014-03-05 VITALS — BP 150/90 | Temp 98.3°F | Wt 202.0 lb

## 2014-03-05 DIAGNOSIS — I10 Essential (primary) hypertension: Secondary | ICD-10-CM

## 2014-03-05 NOTE — Patient Instructions (Signed)
Norvasc 10 mg........Marland Kitchen 1 in the morning  Hyzaar 100-12.5.......Marland Kitchen 1-1/2 tablets in the morning  Check your blood pressure daily in the morning  Return in one month for followup

## 2014-03-05 NOTE — Progress Notes (Signed)
Pre visit review using our clinic review tool, if applicable. No additional management support is needed unless otherwise documented below in the visit note. 

## 2014-03-05 NOTE — Progress Notes (Signed)
   Subjective:    Patient ID: Antonio Newton, male    DOB: 1946/06/26, 68 y.o.   MRN: [id number]  HPI Antonio Newton is a 68 year old married male nonsmoker who comes in today for followup of hypertension  He's taking Hyzaar 100-12.5 daily along with Norvasc 10 mg daily. BP still not normal. No side effects to medication   Review of Systems Review of systems negative    Objective:   Physical Exam  Well-developed well nourished male no acute distress vital signs stable is afebrile BP right arm sitting position 150/90      Assessment & Plan:  Hypertension not at goal increase the Hyzaar to tablet a half daily  Continue Norvasc 10 mg daily followup in one month

## 2014-04-16 ENCOUNTER — Encounter: Payer: Self-pay | Admitting: Family Medicine

## 2014-04-16 ENCOUNTER — Ambulatory Visit (INDEPENDENT_AMBULATORY_CARE_PROVIDER_SITE_OTHER): Payer: Medicare Other | Admitting: Family Medicine

## 2014-04-16 VITALS — BP 180/110 | Temp 98.4°F | Wt 204.0 lb

## 2014-04-16 DIAGNOSIS — I10 Essential (primary) hypertension: Secondary | ICD-10-CM

## 2014-04-16 MED ORDER — LOSARTAN POTASSIUM-HCTZ 100-12.5 MG PO TABS: ORAL_TABLET | ORAL | Status: DC

## 2014-04-16 NOTE — Progress Notes (Signed)
Pre visit review using our clinic review tool, if applicable. No additional management support is needed unless otherwise documented below in the visit note. 

## 2014-04-16 NOTE — Progress Notes (Signed)
   Subjective:    Patient ID: Antonio Newton, male    DOB: 12-28-1945, 68 y.o.   MRN: [id number]  HPI Darvell is a 68 year old married male nonsmoker who comes in today for followup of hypertension  His BP on 10 mg of Norvasc and a tablet a half of Hyzaar is 160/90. This is consistent with what he is getting at home negative review of systems negative he's been compliant with his medication   Review of Systems     Objective:   Physical Exam  Well-developed well-nourished male no acute distress vital signs stable he is afebrile BP right arm sitting position 160/90      Assessment & Plan:  Hypertension not at goal,,,,,,

## 2014-04-16 NOTE — Patient Instructions (Signed)
Norvasc 10 mg,,,,,,, 1 daily in the morning  Hyzaar 100-12.5......... 2 tabs in the morning  Check your blood pressure daily in the morning  Return in one month for followup with the data and the device  If you need a new blood pressure cuff I would recommend,,,,,,,,,,,,,omron digital pump up blood pressure cuff

## 2014-05-15 ENCOUNTER — Ambulatory Visit (INDEPENDENT_AMBULATORY_CARE_PROVIDER_SITE_OTHER): Payer: Medicare Other | Admitting: Family Medicine

## 2014-05-15 ENCOUNTER — Encounter: Payer: Self-pay | Admitting: Family Medicine

## 2014-05-15 VITALS — BP 130/90 | Temp 97.8°F | Wt 200.0 lb

## 2014-05-15 DIAGNOSIS — I10 Essential (primary) hypertension: Secondary | ICD-10-CM

## 2014-05-15 DIAGNOSIS — Z23 Encounter for immunization: Secondary | ICD-10-CM

## 2014-05-15 NOTE — Patient Instructions (Signed)
Continue current medication  Set up a time in December for your annual complete exam  Since your blood pressure is normalized........ check it once weekly

## 2014-05-15 NOTE — Progress Notes (Signed)
   Subjective:    Patient ID: Antonio Newton, male    DOB: April 01, 1946, 68 y.o.   MRN: [id number]  HPI Celestine is a 68 year old married male nonsmoker who comes in today for followup of hypertension. We've been rearranging his medications because we've not able to get his blood pressure goal. He's currently on Norvasc 10 mg daily losartan 100-12.5 dose 2 tabs daily. BP now 130/90  Home blood pressure readings 135/85 or less  He's, have a biopsy because of an elevated PSA his father had prostate cancer   Review of Systems Negative    Objective:   Physical Exam  Well-developed well-nourished male in no acute distress vital signs stable is afebrile BP 130/90      Assessment & Plan:  Hypertension at goal continue current therapy

## 2014-05-15 NOTE — Progress Notes (Signed)
Pre visit review using our clinic review tool, if applicable. No additional management support is needed unless otherwise documented below in the visit note. 

## 2014-05-16 ENCOUNTER — Ambulatory Visit: Payer: Medicare Other | Admitting: Family Medicine

## 2014-05-31 ENCOUNTER — Ambulatory Visit: Payer: Medicare Other | Admitting: Neurology

## 2014-05-31 ENCOUNTER — Telehealth: Payer: Self-pay | Admitting: Family Medicine

## 2014-05-31 ENCOUNTER — Encounter: Payer: Self-pay | Admitting: Family Medicine

## 2014-05-31 DIAGNOSIS — I10 Essential (primary) hypertension: Secondary | ICD-10-CM

## 2014-05-31 MED ORDER — LOSARTAN POTASSIUM-HCTZ 100-12.5 MG PO TABS: ORAL_TABLET | ORAL | Status: DC

## 2014-05-31 NOTE — Telephone Encounter (Signed)
Note sent to patient in [place]

## 2014-05-31 NOTE — Telephone Encounter (Signed)
[facility] states pt's insurance will only pay for once a day for rx losartan-hydrochlorothiazide (HYZAAR) 100-12.5 MG per tablet, rx was written for twice daily, please resend

## 2014-05-31 NOTE — Telephone Encounter (Signed)
Rx sent 

## 2014-06-03 ENCOUNTER — Encounter: Payer: Self-pay | Admitting: *Deleted

## 2014-06-03 ENCOUNTER — Telehealth: Payer: Self-pay | Admitting: Family Medicine

## 2014-06-03 MED ORDER — LOSARTAN POTASSIUM 100 MG PO TABS: 100.0000 mg | ORAL_TABLET | Freq: Two times a day (BID) | ORAL | Status: DC

## 2014-06-03 MED ORDER — HYDROCHLOROTHIAZIDE 12.5 MG PO CAPS: 12.5000 mg | ORAL_CAPSULE | Freq: Two times a day (BID) | ORAL | Status: DC

## 2014-06-03 NOTE — Addendum Note (Signed)
Addended by: Westley Hummer B on: 06/03/2014 08:26 AM   Modules accepted: Orders, Medications

## 2014-06-03 NOTE — Telephone Encounter (Signed)
[employer] [facility] denied Losartan 100 mg, quantity 2 per day.  The maximum quantity is 1 per day.

## 2014-06-04 NOTE — Telephone Encounter (Signed)
Sent patient a mychart message to find out from his insurance company to see what is covered.

## 2014-06-14 ENCOUNTER — Telehealth: Payer: Self-pay | Admitting: Family Medicine

## 2014-06-14 MED ORDER — LOSARTAN POTASSIUM 100 MG PO TABS: 100.0000 mg | ORAL_TABLET | Freq: Two times a day (BID) | ORAL | Status: DC

## 2014-06-14 NOTE — Telephone Encounter (Signed)
Pt had [facility] call to help w/ his med.  Pt states he only received 30 tabs losartan (COZAAR) 100 MG tablet.  Pt would like resend so he can have 90 days. [employer] states there may be a quanity limit, so pt may ned rx for 30 days w/ refills if he cannot get 90

## 2014-06-14 NOTE — Telephone Encounter (Signed)
rx sent in electronically 

## 2014-06-18 ENCOUNTER — Telehealth: Payer: Self-pay | Admitting: Family Medicine

## 2014-06-18 MED ORDER — LOSARTAN POTASSIUM 100 MG PO TABS: 100.0000 mg | ORAL_TABLET | Freq: Every day | ORAL | Status: DC

## 2014-06-18 NOTE — Telephone Encounter (Signed)
Insurance company will not let pt take losartan (COZAAR) 100 MG tablet 2 x day, they would like to know what dr todd has decided what he can take. Pt is out of his meds completely. Needs something asap  [facility]

## 2014-06-18 NOTE — Telephone Encounter (Signed)
Spoke with pharmacy and this is the cheapest medication in this class.  Insurance will pay for once a day and patient will have to pay for the second pill.  Attempted to call daughter but mailbox was full.  Spoke with patient and a 30 day was sent

## 2014-06-25 ENCOUNTER — Encounter: Payer: Self-pay | Admitting: Family Medicine

## 2014-06-25 ENCOUNTER — Ambulatory Visit (INDEPENDENT_AMBULATORY_CARE_PROVIDER_SITE_OTHER): Payer: Medicare Other | Admitting: Family Medicine

## 2014-06-25 VITALS — BP 138/80 | Temp 97.9°F | Wt 200.0 lb

## 2014-06-25 DIAGNOSIS — I10 Essential (primary) hypertension: Secondary | ICD-10-CM

## 2014-06-25 MED ORDER — AMLODIPINE BESYLATE 10 MG PO TABS: 10.0000 mg | ORAL_TABLET | Freq: Every day | ORAL | Status: DC

## 2014-06-25 MED ORDER — HYDROCHLOROTHIAZIDE 25 MG PO TABS: 25.0000 mg | ORAL_TABLET | Freq: Every day | ORAL | Status: DC

## 2014-06-25 MED ORDER — LOSARTAN POTASSIUM 100 MG PO TABS: 100.0000 mg | ORAL_TABLET | Freq: Every day | ORAL | Status: DC

## 2014-06-25 NOTE — Progress Notes (Signed)
   Subjective:    Patient ID: Antonio Newton, male    DOB: 1946-08-18, 68 y.o.   MRN: [id number]  HPI Antonio Newton is a 68 year old married male nonsmoker who comes in today for followup of hypertension. He is on Norvasc 10 mg, hydrochlorothiazide 25 mg, and low start him 200 mg daily. His insurance will only cover 100 mg. BP today 130/80   Review of Systems    review of systems otherwise negative Objective:   Physical Exam Well-developed well-nourished male no acute distress vital signs stable is afebrile BP 130/80       Assessment & Plan:  Hypertension at goal............ decrease losartan to 100 mg daily is, continue the hydrochlorothiazide at 25 and the Norvasc to 10

## 2014-06-25 NOTE — Patient Instructions (Signed)
Hydrochlorothiazide 25 mg.........Marland Kitchen 1 daily in the morning  Cozaar 100 mg............Marland Kitchen 1 daily in the morning  Norvasc 10 mg........Marland Kitchen 1 daily in the morning  Check your blood pressure daily in the morning return the second week in August for followup with a record of all your blood pressure readings and the device

## 2014-06-25 NOTE — Progress Notes (Signed)
Pre visit review using our clinic review tool, if applicable. No additional management support is needed unless otherwise documented below in the visit note. 

## 2014-08-08 ENCOUNTER — Ambulatory Visit: Payer: Medicare Other | Admitting: Family Medicine

## 2014-08-15 ENCOUNTER — Ambulatory Visit (INDEPENDENT_AMBULATORY_CARE_PROVIDER_SITE_OTHER): Payer: Medicare Other | Admitting: Family Medicine

## 2014-08-15 ENCOUNTER — Encounter: Payer: Self-pay | Admitting: Family Medicine

## 2014-08-15 VITALS — BP 130/88 | Temp 98.4°F | Wt 197.0 lb

## 2014-08-15 DIAGNOSIS — R7989 Other specified abnormal findings of blood chemistry: Secondary | ICD-10-CM

## 2014-08-15 DIAGNOSIS — Z23 Encounter for immunization: Secondary | ICD-10-CM

## 2014-08-15 DIAGNOSIS — E291 Testicular hypofunction: Secondary | ICD-10-CM

## 2014-08-15 DIAGNOSIS — I10 Essential (primary) hypertension: Secondary | ICD-10-CM

## 2014-08-15 NOTE — Progress Notes (Signed)
   Subjective:    Patient ID: Antonio Newton, male    DOB: [birth year], 68 y.o.   MRN: [id number]  HPI Antonio Newton is a 68 year old male who comes in today for followup of hypertension  He's on Norvasc 10 mg, or t 100 mg daily. BP today here 130/88 all his blood pressures at home are normalhiazide 25 mg, and with certain   Review of Systems     review of systems negative Objective:   Physical Exam        Assessment & Plan:   hypertension at goal .......... continue current therapy followup CPX in December

## 2014-08-15 NOTE — Patient Instructions (Signed)
Set up a time in December for your annual checkup  Continue current medications daily  BP check weekly since your blood pressures drop back to normal  Labs one week prior to checkup

## 2014-08-15 NOTE — Progress Notes (Signed)
Pre visit review using our clinic review tool, if applicable. No additional management support is needed unless otherwise documented below in the visit note. 

## 2014-08-16 ENCOUNTER — Encounter: Payer: Self-pay | Admitting: Family Medicine

## 2014-10-17 ENCOUNTER — Other Ambulatory Visit: Payer: Self-pay | Admitting: *Deleted

## 2014-10-17 ENCOUNTER — Telehealth: Payer: Self-pay | Admitting: Neurology

## 2014-10-17 MED ORDER — GABAPENTIN 100 MG PO CAPS: ORAL_CAPSULE | ORAL | Status: DC

## 2014-10-17 NOTE — Telephone Encounter (Signed)
Rx sent in.  Patient notified  

## 2014-10-17 NOTE — Telephone Encounter (Signed)
Pt called, he was seen by Art Buff, NP w/ [facility] program) [phone] wants to increase Gabapentin dose. Please call pt @ Cell [phone]. / Sherri S.

## 2014-10-17 NOTE — Telephone Encounter (Signed)
The NP said that patient should be on at least 200 mg instead of just the 100 mg since he is having some burning in his foot.  Ok to increase?

## 2014-10-17 NOTE — Telephone Encounter (Signed)
Yes, Rx says two tablets at bedtime.  Lakeita Panther K. Posey Pronto, DO

## 2014-10-17 NOTE — Telephone Encounter (Signed)
Pt called/ returning your call C/B [phone]

## 2014-10-18 ENCOUNTER — Telehealth: Payer: Self-pay | Admitting: Neurology

## 2014-10-18 NOTE — Telephone Encounter (Signed)
Pt called requesting to speak to a nurse regarding her meds.  C/B [phone]

## 2014-10-18 NOTE — Telephone Encounter (Signed)
Patient notified of the change.  Called Joe at [facility] and changed Rx there.

## 2014-10-18 NOTE — Telephone Encounter (Signed)
Patient is taking 2 qhs.  Can we increase it to 3?

## 2014-10-18 NOTE — Telephone Encounter (Signed)
Yes, ok to increase to 300mg  at bedtime, if no improvement in one week.  Increase to 100mg  in the morning and 300mg  at bedtime.  Brycen Bean K. Posey Pronto, DO

## 2014-11-07 ENCOUNTER — Telehealth: Payer: Self-pay | Admitting: Family Medicine

## 2014-11-07 ENCOUNTER — Emergency Department (HOSPITAL_COMMUNITY)
Admission: EM | Admit: 2014-11-07 | Discharge: 2014-11-07 | Disposition: A | Discharge status: Home / Self Care | Payer: Medicare Other | Source: Home / Self Care | Attending: Family Medicine | Admitting: Family Medicine

## 2014-11-07 ENCOUNTER — Encounter (HOSPITAL_COMMUNITY): Payer: Self-pay | Admitting: Family Medicine

## 2014-11-07 DIAGNOSIS — R109 Unspecified abdominal pain: Secondary | ICD-10-CM

## 2014-11-07 HISTORY — DX: Malignant (primary) neoplasm, unspecified: C80.1

## 2014-11-07 HISTORY — DX: Polyneuropathy, unspecified: G62.9

## 2014-11-07 LAB — POCT H PYLORI SCREEN: H. PYLORI SCREEN, POC: NEGATIVE

## 2014-11-07 MED ORDER — SUCRALFATE 1 G PO TABS: 1.0000 g | ORAL_TABLET | Freq: Three times a day (TID) | ORAL | Status: DC

## 2014-11-07 MED ORDER — OMEPRAZOLE 20 MG PO CPDR: 20.0000 mg | DELAYED_RELEASE_CAPSULE | Freq: Every day | ORAL | Status: DC

## 2014-11-07 NOTE — ED Notes (Signed)
68 year old male with complaints of abd pain on and off for 3-4 months.

## 2014-11-07 NOTE — Discharge Instructions (Signed)
The cause of your abdominal discomfort is not clear.  You were tested for infection and this came back negative (H Pylori) I do not think this is related to your prostate This may be from reflux, lactose intolerance, gut flora imbalance, or stomach ulcers Be on the look out for dark tarry stools - indication fo bleeding ulcer Please start the prilosec twice a day for 14 days then once daily as needed If this does not improve your symptoms than start the carafate.  Please keep a journal of your symptoms Consider starting a lactose free milk or taking lactaid Please also consider trying a probiotic daily.  If your symptoms do not improve with these treatments consider seeing a GI doctor

## 2014-11-07 NOTE — Telephone Encounter (Signed)
Patient Information:  Caller Name: Kasandra Knudsen  Phone: [phone]  Patient: Antonio Newton, Antonio Newton  Gender: Male  DOB: Apr 24, 1946  Age: 68 Years  PCP: Stevie Kern [facility])  Office Follow Up:  Does the office need to follow up with this patient?: No  Instructions For The Office: N/A   Symptoms  Reason For Call & Symptoms: Pt states he had similar sxs approximately 6 months ago with stomach upset and Dr Sherren Mocha thought it may be a GI virus at that time.  Sxs went away and now have returned over the past 2 weeks.   Pain is intermittent, some days he has no pain.   11/07/14 pt was fine in the morning, upper abdominal pain onset at 930.   Pt drank ginger tea and it is now starting to improve, rates 5/10 at 1130 and is constant and describes as an ache.  He has started Zantac x 1 week and making sure he takes his meds with meals but can tell no difference.  Just feels "yucky'.    Pt is belching a little more than normal.   Afebrile, no diarrhea or vomiting.  Pt is concerned about possible ulcer.   Pt also notes he has been on an increased dosage of Neurontin x 2 weeks (100mg  qam and 300mg  qpm).   Due to ER disposition policy, spoke with Estill Bamberg team lead in office, she advised that pt would need to go to ER for further evaluation.   Pt made aware that he would need to go to ER for evaluation now, he agrees and will go to [facility] ER.  Reviewed Health History In EMR: Yes  Reviewed Medications In EMR: Yes  Reviewed Allergies In EMR: Yes  Reviewed Surgeries / Procedures: Yes  Date of Onset of Symptoms: 10/24/2014  Treatments Tried: Zantac  Treatments Tried Worked: No  Guideline(s) Used:  Abdominal Pain - Upper  Disposition Per Guideline:   Go to ED Now  Reason For Disposition Reached:   Pain lasting > 10 minutes and over 31 years old  Advice Given:  N/A  Patient Will Follow Care Advice:  YES

## 2014-11-07 NOTE — ED Provider Notes (Signed)
CSN: [id number]     Arrival date & time 11/07/14  1301 History   None    Chief Complaint  Patient presents with  . Abdominal Pain   (Consider location/radiation/quality/duration/timing/severity/associated sxs/prior Treatment) HPI  Abd pain: ongoing for 3 mo. Epigastric region. Comes and goes. Called PCP but unable to get appt. Burping more. Zantac w/ mild benefit. Sour, achy and dull sensation. No different w/ food. Denies n/v/d/c, fevers, melena, hematemesis. Unsure of food or stress triggers.   HTN: did not take medications.    Past Medical History  Diagnosis Date  . Hypertension   . ED (erectile dysfunction)   . OA (osteoarthritis)     left hip  . Cancer     prostate  . Neuropathy    Past Surgical History  Procedure Laterality Date  . None     Family History  Problem Relation Age of Onset  . Hypertension Father     Deceased, 36  . Hypertension Mother     Deceased, 5  . Healthy Sister    History  Substance Use Topics  . Smoking status: Never Smoker   . Smokeless tobacco: Not on file  . Alcohol Use: Yes     Comment: Occasionally (few times per month)    Review of Systems  Allergies  Penicillins  Home Medications   Prior to Admission medications   Medication Sig Start Date End Date Taking? Authorizing Provider  amLODipine (NORVASC) 10 MG tablet Take 1 tablet (10 mg total) by mouth daily. 06/25/14   Dorena Cookey, MD  aspirin 81 MG tablet Take 81 mg by mouth daily.      Historical Provider, MD  calcium-vitamin D 250-100 MG-UNIT per tablet Take 1 tablet by mouth daily.      Historical Provider, MD  fish oil-omega-3 fatty acids 1000 MG capsule Take 2 g by mouth daily.      Historical Provider, MD  gabapentin (NEURONTIN) 100 MG capsule Take one tablet at bedtime for one week, then increase to two tablets at bedtime. 10/17/14   Donika Keith Rake, DO  hydrochlorothiazide (HYDRODIURIL) 25 MG tablet Take 1 tablet (25 mg total) by mouth daily. 06/25/14   Dorena Cookey, MD  losartan (COZAAR) 100 MG tablet Take 1 tablet (100 mg total) by mouth daily. 06/25/14   Dorena Cookey, MD  multivitamin [facility]) per tablet Take 1 tablet by mouth daily.      Historical Provider, MD  omeprazole (PRILOSEC) 20 MG capsule Take 1 capsule (20 mg total) by mouth daily. 11/07/14   Waldemar Dickens, MD  sildenafil (VIAGRA) 100 MG tablet Take 1 tablet (100 mg total) by mouth daily as needed. 12/12/13   Dorena Cookey, MD  sucralfate (CARAFATE) 1 G tablet Take 1 tablet (1 g total) by mouth 4 (four) times daily -  with meals and at bedtime. 11/07/14   Waldemar Dickens, MD   BP 160/84 mmHg  Pulse 72  Temp(Src) 98.4 F (36.9 C) (Oral)  Resp 12  SpO2 98% Physical Exam  Constitutional: He is oriented to person, place, and time. He appears well-developed and well-nourished. No distress.  HENT:  Head: Normocephalic and atraumatic.  Eyes: EOM are normal. Pupils are equal, round, and reactive to light.  Neck: Normal range of motion. Neck supple.  Cardiovascular: Normal rate.   Pulmonary/Chest: Effort normal and breath sounds normal.  Abdominal: Soft. Bowel sounds are normal. He exhibits no distension and no mass. There is no tenderness. There is  no rebound and no guarding.  Neurological: He is alert and oriented to person, place, and time.  Skin: Skin is warm. He is not diaphoretic.  Psychiatric: He has a normal mood and affect. His behavior is normal. Judgment and thought content normal.    ED Course  Procedures (including critical care time) Labs Review Labs Reviewed  POCT H PYLORI SCREEN    Imaging Review No results found.   MDM   1. Abdominal discomfort    Etiology unclear - GERD, stomach ulcers, lactose intolerance, gut flora imbalance, IBS. Hpylori neg Start prilosec  Add carafate if not improving Consider probiotic Consider lactose free products or lactaid Keep a detailed journal No evidence of GI bleed but pt to monitor Consider GI referral if not  improving  Linna Darner, MD Family Medicine 11/07/2014, 1:52 PM      Waldemar Dickens, MD 11/07/14 1352

## 2014-12-16 ENCOUNTER — Other Ambulatory Visit: Payer: Self-pay | Admitting: Family Medicine

## 2014-12-16 ENCOUNTER — Encounter: Payer: Self-pay | Admitting: Family Medicine

## 2014-12-17 ENCOUNTER — Other Ambulatory Visit: Payer: Medicare Other

## 2014-12-23 ENCOUNTER — Encounter: Payer: Medicare Other | Admitting: Family Medicine

## 2014-12-25 ENCOUNTER — Encounter: Payer: Medicare Other | Admitting: Family Medicine

## 2014-12-26 ENCOUNTER — Encounter: Payer: Medicare Other | Admitting: Family Medicine

## 2015-01-01 ENCOUNTER — Telehealth: Payer: Self-pay | Admitting: *Deleted

## 2015-01-01 DIAGNOSIS — R194 Change in bowel habit: Secondary | ICD-10-CM

## 2015-01-01 MED ORDER — OMEPRAZOLE 20 MG PO CPDR: 20.0000 mg | DELAYED_RELEASE_CAPSULE | Freq: Every day | ORAL | Status: DC

## 2015-01-01 NOTE — Telephone Encounter (Signed)
Patient requesting a refill of Prilosec and a referral to gi. Refill sent. Referral placed. Left message on machine for patient.

## 2015-01-03 ENCOUNTER — Encounter: Payer: Self-pay | Admitting: Internal Medicine

## 2015-01-06 ENCOUNTER — Other Ambulatory Visit: Payer: Medicare Other

## 2015-01-06 ENCOUNTER — Ambulatory Visit (INDEPENDENT_AMBULATORY_CARE_PROVIDER_SITE_OTHER): Payer: Medicare Other | Admitting: Physician Assistant

## 2015-01-06 ENCOUNTER — Other Ambulatory Visit (INDEPENDENT_AMBULATORY_CARE_PROVIDER_SITE_OTHER): Payer: Medicare Other

## 2015-01-06 ENCOUNTER — Encounter: Payer: Self-pay | Admitting: Physician Assistant

## 2015-01-06 VITALS — BP 132/80 | HR 80 | Ht 71.0 in | Wt 197.0 lb

## 2015-01-06 DIAGNOSIS — R1013 Epigastric pain: Secondary | ICD-10-CM

## 2015-01-06 DIAGNOSIS — Z1211 Encounter for screening for malignant neoplasm of colon: Secondary | ICD-10-CM | POA: Diagnosis not present

## 2015-01-06 LAB — HEPATIC FUNCTION PANEL
ALT: 33 U/L (ref 0–53)
AST: 23 U/L (ref 0–37)
Albumin: 4.5 g/dL (ref 3.5–5.2)
Alkaline Phosphatase: 58 U/L (ref 39–117)
Bilirubin, Direct: 0.2 mg/dL (ref 0.0–0.3)
Total Bilirubin: 0.8 mg/dL (ref 0.2–1.2)
Total Protein: 8 g/dL (ref 6.0–8.3)

## 2015-01-06 LAB — LIPASE: LIPASE: 34 U/L (ref 11.0–59.0)

## 2015-01-06 LAB — AMYLASE: Amylase: 72 U/L (ref 27–131)

## 2015-01-06 MED ORDER — MOVIPREP 100 G PO SOLR: 1.0000 | Freq: Once | ORAL | Status: DC

## 2015-01-06 MED ORDER — SUCRALFATE 1 GM/10ML PO SUSP: ORAL | Status: DC

## 2015-01-06 MED ORDER — OMEPRAZOLE 40 MG PO CPDR: 40.0000 mg | DELAYED_RELEASE_CAPSULE | Freq: Every day | ORAL | Status: DC

## 2015-01-06 NOTE — Progress Notes (Signed)
Agree with initial assessment and plans as outlined 

## 2015-01-06 NOTE — Progress Notes (Signed)
Patient ID: Antonio Newton, male   DOB: 20-Feb-1946, 69 y.o.   MRN: [id number]    HPI:   Antonio Newton is a 69 year old male referred for evaluation by Dr. Sherren Mocha due to epigastric discomfort and need for surveillance colonoscopy.  Antonio Newton had a colonoscopy by Dr. On 06/07/2005. It was a normal examination and no polyps were seen. He was advised to repeat his colonoscopy in 7 years for repeat screening. He reports that up until several months ago, he had a normal formed bowel movement every day. For the past few months however, his stools have been alternating between days of formed stools and days of loose stools. He is unable to identify any specific foods that exacerbate this problem. He denies excess gas. He has no bright red blood per rectum or melena.  He also reports that in the fall he thought he had a stomach virus. He felt queasy and nauseous. He was seen at the urgent care center for epigastric pain that was intermittent and would come and go. He was burping and belching. He had tried Zantac with minimal relief. He felt his stomach (points to the epigastric area) was sour, achy and felt dull. His discomfort was not alleviated nor exacerbated with ingestion of food. He had no nausea vomiting diarrhea or chills. He had no fevers, melena several hematemesis. He was seen in urgent care and told he may have an ulcer he was given Carafate and omeprazole which alleviated his symptoms and out of Carafate his symptoms started to come back he started using Maalox but feels it does not provide as much relief of Carafate. He feels his appetite is "off" he was lost 1 or 2 pounds. He has no dysphagia or globus sensation. He reports that he feels very bloated in the epigastric area after he eats and he feels full sooner than normal.   Past Medical History  Diagnosis Date  . Hypertension   . ED (erectile dysfunction)   . OA (osteoarthritis)     left hip  . Cancer     prostate  . Neuropathy      Past Surgical History  Procedure Laterality Date  . None     Family History  Problem Relation Age of Onset  . Hypertension Father     Deceased, 25  . Hypertension Mother     Deceased, 87  . Healthy Sister    History  Substance Use Topics  . Smoking status: Never Smoker   . Smokeless tobacco: Never Used  . Alcohol Use: 0.0 oz/week    0 Not specified per week     Comment: Occasionally (few times per month)   Current Outpatient Prescriptions  Medication Sig Dispense Refill  . amLODipine (NORVASC) 10 MG tablet Take 1 tablet (10 mg total) by mouth daily. 90 tablet 3  . aspirin 81 MG tablet Take 81 mg by mouth daily.      . calcium-vitamin D 250-100 MG-UNIT per tablet Take 1 tablet by mouth daily.      . fish oil-omega-3 fatty acids 1000 MG capsule Take 2 g by mouth daily.      Marland Kitchen gabapentin (NEURONTIN) 100 MG capsule Take one tablet at bedtime for one week, then increase to two tablets at bedtime. 60 capsule 3  . hydrochlorothiazide (HYDRODIURIL) 25 MG tablet Take 1 tablet (25 mg total) by mouth daily. 90 tablet 3  . losartan (COZAAR) 100 MG tablet Take 1 tablet (100 mg total) by mouth daily. 100 tablet  3  . multivitamin (THERAGRAN) per tablet Take 1 tablet by mouth daily.      Marland Kitchen omeprazole (PRILOSEC) 20 MG capsule Take 1 capsule (20 mg total) by mouth daily. 30 capsule 5  . sildenafil (VIAGRA) 100 MG tablet Take 1 tablet (100 mg total) by mouth daily as needed. 10 tablet 11  . MOVIPREP 100 G SOLR Take 1 kit (200 g total) by mouth once. 1 kit 0  . omeprazole (PRILOSEC) 40 MG capsule Take 1 capsule (40 mg total) by mouth daily. 90 capsule 1  . sucralfate (CARAFATE) 1 GM/10ML suspension Take 1 gram before each meal and at bedtime (4 times a day) 420 mL 1   No current facility-administered medications for this visit.   Allergies  Allergen Reactions  . Penicillins     REACTION: family hx     Review of Systems: Gen: Denies any fever, chills, sweats, anorexia, fatigue,  weakness, malaise, weight loss, and sleep disorder CV: Denies chest pain, angina, palpitations, syncope, orthopnea, PND, peripheral edema, and claudication. Resp: Denies dyspnea at rest, dyspnea with exercise, cough, sputum, wheezing, coughing up blood, and pleurisy. GI: Denies vomiting blood, jaundice, and fecal incontinence.   Denies dysphagia or odynophagia. GU : Denies urinary burning, blood in urine, urinary frequency, urinary hesitancy, nocturnal urination, and urinary incontinence. MS: Denies joint pain, limitation of movement, and swelling, stiffness, low back pain, extremity pain. Denies muscle weakness, cramps, atrophy.  Derm: Denies rash, itching, dry skin, hives, moles, warts, or unhealing ulcers.  Psych: Denies depression, anxiety, memory loss, suicidal ideation, hallucinations, paranoia, and confusion. Heme: Denies bruising, bleeding, and enlarged lymph nodes. Neuro:  Denies any headaches, dizziness, paresthesias. Endo:  Denies any problems with DM, thyroid, adrenal function   Prior Endoscopies:  Colonoscopy 06/07/2005 was a normal examination, no polyps seen.  Physical Exam: BP 132/80 mmHg  Pulse 80  Ht _0  (1.803 m)  Wt 197 lb (89.359 kg)  BMI 27.49 kg/m2 Constitutional: Pleasant,well-developed male in no acute distress. HEENT: Normocephalic and atraumatic. Conjunctivae are normal. No scleral icterus. Neck supple. No thyromegaly Cardiovascular: Normal rate, regular rhythm.  Pulmonary/chest: Effort normal and breath sounds normal. No wheezing, rales or rhonchi. Abdominal: Soft, nondistended, nontender. Bowel sounds active throughout. There are no masses palpable. No hepatomegaly. Extremities: no edema Lymphadenopathy: No cervical adenopathy noted. Neurological: Alert and oriented to person place and time. Skin: Skin is warm and dry. No rashes noted. Psychiatric: Normal mood and affect. Behavior is normal.  ASSESSMENT AND PLAN: #1. Colorectal cancer screening. He  will be scheduled for colonoscopy to evaluate for polyps or neoplasia.The risks, benefits, and alternatives to colonoscopy with possible biopsy and possible polypectomy were discussed with the patient and they consent to proceed.The procedure will be scheduled with Dr. Henrene Pastor.  #2. Epigastric discomfort and dyspepsia. An antireflux regimen has been reviewed. LFTs, amylase, and lipase will be obtained. He will be restarted on Carafate 1 g before meals and at bedtime along with omeprazole 40 mg by mouth every morning. He will be scheduled for an EGD to assess for esophagitis, gastritis, ulcer, H. pylori etc.The risks, benefits, and alternatives to endoscopy with possible biopsy and possible dilation were discussed with the patient and they consent to proceed.      Marabelle Cushman, Vita Barley PA-C 01/06/2015, 11:19 AM

## 2015-01-06 NOTE — Patient Instructions (Signed)
Your physician has requested that you go to the basement for the following lab work before leaving today:  LFTs, Amylase, Lipase  We have sent the following medications to your pharmacy for you to pick up at your convenience:  Omeprazole and Carafate  You have been scheduled for an endoscopy and colonoscopy. Please follow the written instructions given to you at your visit today. Please pick up your prep at the pharmacy within the next 1-3 days. If you use inhalers (even only as needed), please bring them with you on the day of your procedure. Your physician has requested that you go to www.startemmi.com and enter the access code given to you at your visit today. This web site gives a general overview about your procedure. However, you should still follow specific instructions given to you by our office regarding your preparation for the procedure.

## 2015-01-07 ENCOUNTER — Other Ambulatory Visit (INDEPENDENT_AMBULATORY_CARE_PROVIDER_SITE_OTHER): Payer: Medicare Other

## 2015-01-07 DIAGNOSIS — Z125 Encounter for screening for malignant neoplasm of prostate: Secondary | ICD-10-CM

## 2015-01-07 DIAGNOSIS — Z0000 Encounter for general adult medical examination without abnormal findings: Secondary | ICD-10-CM | POA: Diagnosis not present

## 2015-01-07 DIAGNOSIS — I10 Essential (primary) hypertension: Secondary | ICD-10-CM

## 2015-01-07 LAB — POCT URINALYSIS DIPSTICK
Bilirubin, UA: NEGATIVE
Glucose, UA: NEGATIVE
Ketones, UA: NEGATIVE
Leukocytes, UA: NEGATIVE
Nitrite, UA: NEGATIVE
RBC UA: NEGATIVE
Spec Grav, UA: 1.02
UROBILINOGEN UA: 0.2
pH, UA: 7

## 2015-01-07 LAB — HEPATIC FUNCTION PANEL
ALK PHOS: 61 U/L (ref 39–117)
ALT: 32 U/L (ref 0–53)
AST: 20 U/L (ref 0–37)
Albumin: 4.4 g/dL (ref 3.5–5.2)
Bilirubin, Direct: 0.1 mg/dL (ref 0.0–0.3)
Total Bilirubin: 0.8 mg/dL (ref 0.2–1.2)
Total Protein: 7.5 g/dL (ref 6.0–8.3)

## 2015-01-07 LAB — LIPID PANEL
CHOLESTEROL: 146 mg/dL (ref 0–200)
HDL: 30.3 mg/dL — ABNORMAL LOW (ref >39.00)
LDL Cholesterol: 90 mg/dL (ref 0–99)
NonHDL: 115.7
Total CHOL/HDL Ratio: 5
Triglycerides: 129 mg/dL (ref 0.0–149.0)
VLDL: 25.8 mg/dL (ref 0.0–40.0)

## 2015-01-07 LAB — BASIC METABOLIC PANEL
BUN: 22 mg/dL (ref 6–23)
CHLORIDE: 99 meq/L (ref 96–112)
CO2: 32 mEq/L (ref 19–32)
Calcium: 9.1 mg/dL (ref 8.4–10.5)
Creatinine, Ser: 1.2 mg/dL (ref 0.4–1.5)
GFR: 64.6 mL/min (ref >60.00)
GLUCOSE: 100 mg/dL — AB (ref 70–99)
POTASSIUM: 3.6 meq/L (ref 3.5–5.1)
Sodium: 136 mEq/L (ref 135–145)

## 2015-01-07 LAB — CBC WITH DIFFERENTIAL/PLATELET
BASOS ABS: 0 10*3/uL (ref 0.0–0.1)
Basophils Relative: 0.4 % (ref 0.0–3.0)
Eosinophils Absolute: 0.2 10*3/uL (ref 0.0–0.7)
Eosinophils Relative: 4.2 % (ref 0.0–5.0)
HEMATOCRIT: 42.8 % (ref 39.0–52.0)
Hemoglobin: 14.4 g/dL (ref 13.0–17.0)
LYMPHS ABS: 1 10*3/uL (ref 0.7–4.0)
LYMPHS PCT: 17.9 % (ref 12.0–46.0)
MCHC: 33.7 g/dL (ref 30.0–36.0)
MCV: 87.6 fl (ref 78.0–100.0)
MONOS PCT: 7.4 % (ref 3.0–12.0)
Monocytes Absolute: 0.4 10*3/uL (ref 0.1–1.0)
NEUTROS PCT: 70.1 % (ref 43.0–77.0)
Neutro Abs: 4 10*3/uL (ref 1.4–7.7)
Platelets: 213 10*3/uL (ref 150.0–400.0)
RBC: 4.88 Mil/uL (ref 4.22–5.81)
RDW: 12.6 % (ref 11.5–15.5)
WBC: 5.7 10*3/uL (ref 4.0–10.5)

## 2015-01-07 LAB — PSA: PSA: 6.12 ng/mL — AB (ref 0.10–4.00)

## 2015-01-07 LAB — TSH: TSH: 1.42 u[IU]/mL (ref 0.35–4.50)

## 2015-01-13 ENCOUNTER — Encounter: Payer: Medicare Other | Admitting: Family Medicine

## 2015-01-14 ENCOUNTER — Other Ambulatory Visit: Payer: Self-pay | Admitting: *Deleted

## 2015-01-14 ENCOUNTER — Encounter: Payer: Self-pay | Admitting: Family Medicine

## 2015-01-14 ENCOUNTER — Ambulatory Visit (INDEPENDENT_AMBULATORY_CARE_PROVIDER_SITE_OTHER): Payer: Medicare Other | Admitting: Family Medicine

## 2015-01-14 VITALS — BP 140/80 | Temp 98.0°F | Wt 198.0 lb

## 2015-01-14 DIAGNOSIS — I10 Essential (primary) hypertension: Secondary | ICD-10-CM

## 2015-01-14 DIAGNOSIS — C61 Malignant neoplasm of prostate: Secondary | ICD-10-CM | POA: Insufficient documentation

## 2015-01-14 DIAGNOSIS — F528 Other sexual dysfunction not due to a substance or known physiological condition: Secondary | ICD-10-CM

## 2015-01-14 MED ORDER — AMLODIPINE BESYLATE 10 MG PO TABS: 10.0000 mg | ORAL_TABLET | Freq: Every day | ORAL | Status: DC

## 2015-01-14 MED ORDER — GABAPENTIN 100 MG PO CAPS: ORAL_CAPSULE | ORAL | Status: DC

## 2015-01-14 MED ORDER — LOSARTAN POTASSIUM 100 MG PO TABS: 100.0000 mg | ORAL_TABLET | Freq: Every day | ORAL | Status: DC

## 2015-01-14 MED ORDER — HYDROCHLOROTHIAZIDE 25 MG PO TABS: ORAL_TABLET | ORAL | Status: DC

## 2015-01-14 MED ORDER — SILDENAFIL CITRATE 100 MG PO TABS: 100.0000 mg | ORAL_TABLET | Freq: Every day | ORAL | Status: AC | PRN

## 2015-01-14 NOTE — Progress Notes (Signed)
Pre visit review using our clinic review tool, if applicable. No additional management support is needed unless otherwise documented below in the visit note. 

## 2015-01-14 NOTE — Patient Instructions (Signed)
Continue your current medications  Follow-up in one year sooner if any problems  Healthcare power of attorney and living well

## 2015-01-14 NOTE — Progress Notes (Signed)
Subjective:    Patient ID: Antonio Newton, male    DOB: 1946-02-26, 69 y.o.   MRN: [id number]  HPI Antonio Newton is a 69 year old married male nonsmoker who comes in today for general physical examination because of a history of hypertension, neuropathy etiology unknown, reflux esophagitis, erectile dysfunction, and a new diagnosis of prostate cancer  He's followed by Dr. Darnell Level at the urology center. His prostate cancer is deemed the type this low-grade. Watchful waiting is his current treatment program. He goes every 3-4 months for follow-up.  His blood pressure on Norvasc 10 mg, Hydrocort thiazide 25 twice a day, and Cozaar 100 is 140/80.  He takes Neurontin 100 mg in the morning and 300 mg at bedtime for neuropathy etiology unknown  He takes Prilosec and Carafate under the direction of his gastroenterologist. He's due to go back in the next couple weeks for follow-up colonoscopy. Previous colonoscopy 10 years ago was normal. Vaccinations updated by Apolonio Schneiders  He continues to work he also goes along the community.  Cognitive function normal he continues to work, home health safety reviewed no issues identified, no guns in the house, he does have a healthcare power of attorney and living well  Vaccinations updated by Apolonio Schneiders   Review of Systems  Constitutional: Negative.   HENT: Negative.   Eyes: Negative.   Respiratory: Negative.   Cardiovascular: Negative.   Gastrointestinal: Negative.   Endocrine: Negative.   Genitourinary: Negative.   Musculoskeletal: Negative.   Skin: Negative.   Allergic/Immunologic: Negative.   Neurological: Negative.   Hematological: Negative.   Psychiatric/Behavioral: Negative.        Objective:   Physical Exam  Constitutional: He is oriented to person, place, and time. He appears well-developed and well-nourished.  HENT:  Head: Normocephalic and atraumatic.  Right Ear: External ear normal.  Left Ear: External ear normal.  Nose: Nose normal.    Mouth/Throat: Oropharynx is clear and moist.  Eyes: Conjunctivae and EOM are normal. Pupils are equal, round, and reactive to light.  Neck: Normal range of motion. Neck supple. No JVD present. No tracheal deviation present. No thyromegaly present.  Cardiovascular: Normal rate, regular rhythm, normal heart sounds and intact distal pulses.  Exam reveals no gallop and no friction rub.   No murmur heard. No carotid nor aortic bruits peripheral pulses 2+ and symmetrical  Pulmonary/Chest: Effort normal and breath sounds normal. No stridor. No respiratory distress. He has no wheezes. He has no rales. He exhibits no tenderness.  Abdominal: Soft. Bowel sounds are normal. He exhibits no distension and no mass. There is no tenderness. There is no rebound and no guarding.  Genitourinary: Rectum normal, prostate normal and penis normal. Guaiac negative stool. No penile tenderness.  Musculoskeletal: Normal range of motion. He exhibits no edema or tenderness.  Lymphadenopathy:    He has no cervical adenopathy.  Neurological: He is alert and oriented to person, place, and time. He has normal reflexes. No cranial nerve deficit. He exhibits normal muscle tone.  Skin: Skin is warm and dry. No rash noted. No erythema. No pallor.  Psychiatric: He has a normal mood and affect. His behavior is normal. Judgment and thought content normal.  Nursing note and vitals reviewed.         Assessment & Plan:  Healthy male  Hypertension at goal.......... continue current therapy  Neuropathy etiology unknown..... Continue Neurontin  Erectile dysfunction.....Marland Kitchen continue Viagra when necessary  Reflux esophagitis........ continue Prilosec and Carafate at the direction of your gastroenterologist  Prostate  cancer........ continue active surveillance by Dr. Darnell Level.

## 2015-01-21 ENCOUNTER — Ambulatory Visit (AMBULATORY_SURGERY_CENTER): Payer: Medicare Other | Admitting: Internal Medicine

## 2015-01-21 ENCOUNTER — Encounter: Payer: Self-pay | Admitting: Internal Medicine

## 2015-01-21 VITALS — BP 113/69 | HR 59 | Temp 97.2°F | Resp 18 | Ht 71.0 in | Wt 197.0 lb

## 2015-01-21 DIAGNOSIS — R1013 Epigastric pain: Secondary | ICD-10-CM | POA: Diagnosis not present

## 2015-01-21 DIAGNOSIS — Z1211 Encounter for screening for malignant neoplasm of colon: Secondary | ICD-10-CM | POA: Diagnosis not present

## 2015-01-21 MED ORDER — SODIUM CHLORIDE 0.9 % IV SOLN: 500.0000 mL | INTRAVENOUS | Status: DC

## 2015-01-21 NOTE — Op Note (Signed)
[facility]  [address] [place], [id number]   ENDOSCOPY PROCEDURE REPORT  PATIENT: Bach, Rocchi  MR#: [id number] BIRTHDATE: 09-09-1946 , 68  yrs. old GENDER: male ENDOSCOPIST: Eustace Quail, MD REFERRED BY:  .  Self / Office PROCEDURE DATE:  01/21/2015 PROCEDURE:  EGD w/ biopsy ASA CLASS:     Class II INDICATIONS:  dyspepsia. MEDICATIONS: Monitored anesthesia care and Propofol 100 mg IV TOPICAL ANESTHETIC: none  DESCRIPTION OF PROCEDURE: After the risks benefits and alternatives of the procedure were thoroughly explained, informed consent was obtained.  The LB [id number] endoscope was introduced through the mouth and advanced to the second portion of the duodenum , Without limitations.  The instrument was slowly withdrawn as the mucosa was fully examined.      EXAM: The esophagus and gastroesophageal junction were completely normal in appearance.  The stomach was entered and closely examined.The antrum, angularis, and lesser curvature were well visualized, including a retroflexed view of the cardia and fundus. The stomach wall was normally distensable.  The scope passed easily through the pylorus into the duodenum.  Retroflexed views revealed no abnormalities.     The scope was then withdrawn from the patient and the procedure completed.  COMPLICATIONS: There were no immediate complications.  ENDOSCOPIC IMPRESSION: 1. Normal EGD  RECOMMENDATIONS: 1.  Continue current medications as needed for dyspeptic symptoms (currently using Carafate and omeprazole) 2.  Rx CLO if positive 3. Return to the care of Dr. Sherren Mocha. GI follow-up as needed  REPEAT EXAM:  eSigned:  Eustace Quail, MD 01/21/2015 1:59 PM    CC:The Patient and Dorena Cookey, MD

## 2015-01-21 NOTE — Progress Notes (Signed)
Called to room to assist during endoscopic procedure.  Patient ID and intended procedure confirmed with present staff. Received instructions for my participation in the procedure from the performing physician.  

## 2015-01-21 NOTE — Op Note (Signed)
[facility]  [address]. [place], [id number]   COLONOSCOPY PROCEDURE REPORT  PATIENT: Antonio Newton, Antonio Newton  MR#: [id number] BIRTHDATE: [birth year] , 68  yrs. old GENDER: male ENDOSCOPIST: Eustace Quail, MD REFERRED [id number] Recall, PROCEDURE DATE:  01/21/2015 PROCEDURE:   Colonoscopy, screening First Screening Colonoscopy - Avg.  risk and is 50 yrs.  old or older - No.  Prior Negative Screening - Now for repeat screening. 10 or more years since last screening  History of Adenoma - Now for follow-up colonoscopy & has been > or = to 3 yrs.  N/A  Polyps Removed Today? No.  Recommend repeat exam, <10 yrs? No. ASA CLASS:   Class II INDICATIONS:average risk for colorectal cancer.   . Negative index exam 2006 MEDICATIONS: Monitored anesthesia care and Propofol 250 mg IV  DESCRIPTION OF PROCEDURE:   After the risks benefits and alternatives of the procedure were thoroughly explained, informed consent was obtained.  The digital rectal exam revealed no abnormalities of the rectum.   The LB [id number]  endoscope was introduced through the anus and advanced to the cecum, which was identified by both the appendix and ileocecal valve. No adverse events experienced.   The quality of the prep was excellent, using MoviPrep  The instrument was then slowly withdrawn as the colon was fully examined.      COLON FINDINGS: A normal appearing cecum, ileocecal valve, and appendiceal orifice were identified.  The ascending, transverse, descending, sigmoid colon, and rectum appeared unremarkable. Retroflexed views revealed internal hemorrhoids. The time to cecum=2 minutes 46 seconds.  Withdrawal time=8 minutes 17 seconds. The scope was withdrawn and the procedure completed.   COMPLICATIONS: There were no immediate complications.  ENDOSCOPIC IMPRESSION: 1. Normal colonoscopy  RECOMMENDATIONS: 1.  Continue current colorectal screening recommendations for "routine  risk" patients with a repeat colonoscopy in 10 years. 2.  Upper endoscopy today (please see report)  eSigned:  Eustace Quail, MD 01/21/2015 1:56 PM   cc: The Patient and Dorena Cookey, MD

## 2015-01-21 NOTE — Progress Notes (Signed)
Patient awakening,vss,report to rn 

## 2015-01-21 NOTE — Patient Instructions (Signed)
YOU HAD AN ENDOSCOPIC PROCEDURE TODAY AT THE [facility]: Refer to the procedure report that was given to you for any specific questions about what was found during the examination.  If the procedure report does not answer your questions, please call your gastroenterologist to clarify.  If you requested that your care partner not be given the details of your procedure findings, then the procedure report has been included in a sealed envelope for you to review at your convenience later.  YOU SHOULD EXPECT: Some feelings of bloating in the abdomen. Passage of more gas than usual.  Walking can help get rid of the air that was put into your GI tract during the procedure and reduce the bloating. If you had a lower endoscopy (such as a colonoscopy or flexible sigmoidoscopy) you may notice spotting of blood in your stool or on the toilet paper. If you underwent a bowel prep for your procedure, then you may not have a normal bowel movement for a few days.  DIET: Your first meal following the procedure should be a light meal and then it is ok to progress to your normal diet.  A half-sandwich or bowl of soup is an example of a good first meal.  Heavy or fried foods are harder to digest and may make you feel nauseous or bloated.  Likewise meals heavy in dairy and vegetables can cause extra gas to form and this can also increase the bloating.  Drink plenty of fluids but you should avoid alcoholic beverages for 24 hours.  ACTIVITY: Your care partner should take you home directly after the procedure.  You should plan to take it easy, moving slowly for the rest of the day.  You can resume normal activity the day after the procedure however you should NOT DRIVE or use heavy machinery for 24 hours (because of the sedation medicines used during the test).    SYMPTOMS TO REPORT IMMEDIATELY: A gastroenterologist can be reached at any hour.  During normal business hours, 8:30 AM to 5:00 PM Monday through Friday,  call [phone].  After hours and on weekends, please call the GI answering service at [phone] who will take a message and have the physician on call contact you.   Following lower endoscopy (colonoscopy or flexible sigmoidoscopy):  Excessive amounts of blood in the stool  Significant tenderness or worsening of abdominal pains  Swelling of the abdomen that is new, acute  Fever of 100F or higher  Following upper endoscopy (EGD)  Vomiting of blood or coffee ground material  New chest pain or pain under the shoulder blades  Painful or persistently difficult swallowing  New shortness of breath  Fever of 100F or higher  Black, tarry-looking stools  FOLLOW UP: If any biopsies were taken you will be contacted by phone or by letter within the next 1-3 weeks.  Call your gastroenterologist if you have not heard about the biopsies in 3 weeks.  Our staff will call the home number listed on your records the next business day following your procedure to check on you and address any questions or concerns that you may have at that time regarding the information given to you following your procedure. This is a courtesy call and so if there is no answer at the home number and we have not heard from you through the emergency physician on call, we will assume that you have returned to your regular daily activities without incident.  SIGNATURES/CONFIDENTIALITY: You and/or your care   partner have signed paperwork which will be entered into your electronic medical record.  These signatures attest to the fact that that the information above on your After Visit Summary has been reviewed and is understood.  Full responsibility of the confidentiality of this discharge information lies with you and/or your care-partner.  Normal EGD and normal colonoscopy.  Await results of clo test.  Will be treated if positive.

## 2015-01-22 ENCOUNTER — Telehealth: Payer: Self-pay | Admitting: *Deleted

## 2015-01-22 LAB — HELICOBACTER PYLORI SCREEN-BIOPSY: UREASE: NEGATIVE

## 2015-01-22 NOTE — Telephone Encounter (Signed)
  Follow up Call-  Call back number 01/21/2015  Post procedure Call Back phone  # [phone] cell  Permission to leave phone message Yes     Patient questions:  Do you have a fever, pain , or abdominal swelling? No. Pain Score  0 *  Have you tolerated food without any problems? Yes.    Have you been able to return to your normal activities? Yes.    Do you have any questions about your discharge instructions: Diet   No. Medications  Yes.   Follow up visit  No.  Do you have questions or concerns about your Care? Yes.    Actions: * If pain score is 4 or above: No action needed, pain <4.  Answered several questions regarding pts medications and further treatment. Pt verbalized understanding.

## 2015-02-18 DIAGNOSIS — C61 Malignant neoplasm of prostate: Secondary | ICD-10-CM | POA: Diagnosis not present

## 2015-02-25 DIAGNOSIS — C61 Malignant neoplasm of prostate: Secondary | ICD-10-CM | POA: Diagnosis not present

## 2015-02-25 DIAGNOSIS — N5201 Erectile dysfunction due to arterial insufficiency: Secondary | ICD-10-CM | POA: Diagnosis not present

## 2015-05-23 ENCOUNTER — Telehealth: Payer: Self-pay | Admitting: Family Medicine

## 2015-05-23 NOTE — Telephone Encounter (Signed)
Pt having extreme stiffness in hands.  Doesn't think it is arthiritis. Pt would like to know if he can get referral?  Do you think he should see neuro md or rhumatalogy. Pt currently seein neuro dr patel. Should he return there?

## 2015-05-27 NOTE — Telephone Encounter (Signed)
Left message on machine for patient to call Dr Posey Pronto.

## 2015-05-30 ENCOUNTER — Other Ambulatory Visit: Payer: Self-pay | Admitting: Neurology

## 2015-05-30 ENCOUNTER — Encounter: Payer: Self-pay | Admitting: Neurology

## 2015-05-30 ENCOUNTER — Ambulatory Visit (INDEPENDENT_AMBULATORY_CARE_PROVIDER_SITE_OTHER): Payer: Medicare Other | Admitting: Neurology

## 2015-05-30 VITALS — BP 130/84 | HR 74 | Ht 71.0 in | Wt 193.2 lb

## 2015-05-30 DIAGNOSIS — R202 Paresthesia of skin: Secondary | ICD-10-CM | POA: Diagnosis not present

## 2015-05-30 DIAGNOSIS — G5601 Carpal tunnel syndrome, right upper limb: Secondary | ICD-10-CM | POA: Diagnosis not present

## 2015-05-30 DIAGNOSIS — G5622 Lesion of ulnar nerve, left upper limb: Secondary | ICD-10-CM

## 2015-05-30 DIAGNOSIS — M5412 Radiculopathy, cervical region: Secondary | ICD-10-CM

## 2015-05-30 MED ORDER — GABAPENTIN 300 MG PO CAPS: 300.0000 mg | ORAL_CAPSULE | Freq: Two times a day (BID) | ORAL | Status: DC

## 2015-05-30 MED ORDER — TIZANIDINE HCL 2 MG PO TABS: 2.0000 mg | ORAL_TABLET | Freq: Every evening | ORAL | Status: DC | PRN

## 2015-05-30 NOTE — Progress Notes (Signed)
Follow-up Visit   Date: 05/30/2015    Antonio Newton MRN: [id number] DOB: [birth year]   Interim History: Antonio Newton is a 69 y.o. right-handed African American male with hypertension, ED, and OA returning to the clinic for follow-up of bilateral hand paresthesias and new complaints of right foot numbness.  The patient was accompanied to the clinic by self.  History of present illness: For at least the past 10 years, he reports having intermittent numbness/tingling of his hands (right > left) which is worse at night and when he is using his hands more (gardening, cutting grass). Symptoms are worse over the thumb and first two fingers. He feels as if his nerve is being pinched. If he stands upright and flexes his neck, he feels as if something is being pulled in the back of his neck. Denies any shooting sensation from his neck into his hands, weakness of his hands, or neck pain. He denies sensory symptoms of his feet.   Of note, he reports to doing a significant amount of typing and sleeping with wrists flexed.  UPDATE 05/30/2015:  Patient was last seen in the clinic on 12/09/2013 for bilateral hand paresthesias.  He has since underwent EMG which showed mild right CTS, mild right C7-8 radiculopathy, as well as left ulnar neuropathy across the elbow.  He presents today with complaints of worsening numbness and stiffness in the hands, especially fingers bilaterally.  He is having having numbness of the right great toe.  He previously had the same symptoms on the left, but this resolved.    He is taking gabapentin 100mg  and 300mg  at bedtime which helps, because he no longer has a "hot foot" or neck twinge.  He does not have any side effects due to it.  He reports doing heavy gardening since February and has noticed more stiffness since then. He endorses mild low back pain and neck discomfort.   Medications:  Current Outpatient Prescriptions on File Prior to Visit  Medication Sig  Dispense Refill  . amLODipine (NORVASC) 10 MG tablet Take 1 tablet (10 mg total) by mouth daily. 90 tablet 3  . aspirin 81 MG tablet Take 81 mg by mouth daily.      . calcium-vitamin D 250-100 MG-UNIT per tablet Take 1 tablet by mouth daily.      . fish oil-omega-3 fatty acids 1000 MG capsule Take 2 g by mouth daily.      Marland Kitchen gabapentin (NEURONTIN) 100 MG capsule Take one tablet in the morning and 3 at bedtime 400 capsule 3  . losartan (COZAAR) 100 MG tablet Take 1 tablet (100 mg total) by mouth daily. 100 tablet 3  . multivitamin (THERAGRAN) per tablet Take 1 tablet by mouth daily.      Marland Kitchen omeprazole (PRILOSEC) 20 MG capsule Take 1 capsule (20 mg total) by mouth daily. 30 capsule 5  . sildenafil (VIAGRA) 100 MG tablet Take 1 tablet (100 mg total) by mouth daily as needed. 10 tablet 11  . sucralfate (CARAFATE) 1 GM/10ML suspension Take 1 gram before each meal and at bedtime (4 times a day) 420 mL 1   No current facility-administered medications on file prior to visit.    Allergies:  Allergies  Allergen Reactions  . Penicillins     REACTION: family hx    Review of Systems:  CONSTITUTIONAL: No fevers, chills, night sweats, or weight loss.  EYES: No visual changes or eye pain ENT: No hearing changes.  No history of nose  bleeds.   RESPIRATORY: No cough, wheezing and shortness of breath.   CARDIOVASCULAR: Negative for chest pain, and palpitations.   GI: Negative for abdominal discomfort, blood in stools or black stools.  No recent change in bowel habits.   GU:  No history of incontinence.   MUSCLOSKELETAL: +history of joint pain or swelling.  No myalgias.   SKIN: Negative for lesions, rash, and itching.   ENDOCRINE: Negative for cold or heat intolerance, polydipsia or goiter.   PSYCH:  No depression or anxiety symptoms.   NEURO: As Above.   Vital Signs:  BP 130/84 mmHg  Pulse 74  Ht 5\' 11"  (1.803 m)  Wt 193 lb 3 oz (87.629 kg)  BMI 26.96 kg/m2  SpO2 98%   General: Well appearing,  comfortable Neck: No carotid bruit CV: Regular rate and rhythm Pulm:  Clear to auscultation Ext: No edema  Neurological Exam: MENTAL STATUS including orientation to time, place, person, recent and remote memory, attention span and concentration, language, and fund of knowledge is normal.  Speech is not dysarthric.  CRANIAL NERVES:   Fundoscopic exam is normal. No visual field defects. Pupils equal round and reactive to light.  Normal conjugate, extra-ocular eye movements in all directions of gaze.  No ptosis. Normal facial sensation.  Face is symmetric. Palate elevates symmetrically.  Tongue is midline.  MOTOR:  Motor strength is 5/5 in all extremities.  No atrophy, fasciculations or abnormal movements.  No pronator drift.  Tone is normal.    MSRs:  Reflexes are 2+/4 throughout, except 1+/4 at the Achilles bilaterally.  Plantars are downgoing..  SENSORY:  Intact to vibration, temperature, and pin prick.  COORDINATION/GAIT:  Normal finger-to- nose-finger and heel-to-shin.  Intact rapid alternating movements bilaterally.  Gait narrow based and stable.   Data: EMG bilateral upper extremities 02/20/2014: 1. Right median neuropathy, at or distal to the wrist, consistent with the clinical diagnosis of carpal tunnel syndrome; very mild in degree electrically. 2. Right intraspinal canal lesion (i.e. radiculopathy) affecting the right C7-C8 nerve root/segments; overall, these findings are mild in degree electrically. 3. Left ulnar neuropathy with slowing across the elbow, demyelinating in type.   IMPRESSION/PLAN: Antonio Newton is a 69 year-old gentleman returning for evaluation of worsening bilateral hand paresthesias and stiffness, without associated joint pain.  Although his previous EMG shows mild right CTS and left ulnar neuropathy, his history is more concerning for worsening cervical radiculopathy.  MRI of the cervical spine will be ordered to look for structural disease and I will also start  him in a neck physiotherapy program.  For his paresthesias and muscle stiffness, gabapentin will be optimized to 300mg  twice daily and he can try tizanidine 2mg  qhs prn.  He also complaints of new right foot paresthesias, which may be early signs of neuropathy or mild L5 radiculopathy, however with a normal exam, it is difficult to tell.  Given that his feet symptoms are very mild, I will hold off on additional testing and try to manage symptomatically for now.  Return to clinic in 3 months   The duration of this appointment visit was 35 minutes of face-to-face time with the patient.  Greater than 50% of this time was spent in counseling, explanation of diagnosis, planning of further management, and coordination of care.   Thank you for allowing me to participate in patient's care.  If I can answer any additional questions, I would be pleased to do so.    Sincerely,    Essynce Munsch K. Posey Pronto,  DO

## 2015-05-30 NOTE — Patient Instructions (Addendum)
1.  MRI cervical spine without contrast 2.  Start gabapentin 300mg  twice daily 3.  Start tizanidine 2mg  at bedtime as needed for muscle stiffness 4.  Start neck physiotherapy 5.  Return to clinic in 3 months

## 2015-06-13 ENCOUNTER — Ambulatory Visit
Admission: RE | Admit: 2015-06-13 | Discharge: 2015-06-13 | Disposition: A | Discharge status: Home / Self Care | Payer: Medicare Other | Source: Ambulatory Visit | Attending: Neurology | Admitting: Neurology

## 2015-06-13 DIAGNOSIS — M5011 Cervical disc disorder with radiculopathy,  high cervical region: Secondary | ICD-10-CM | POA: Diagnosis not present

## 2015-06-13 DIAGNOSIS — R202 Paresthesia of skin: Secondary | ICD-10-CM

## 2015-06-13 DIAGNOSIS — M5412 Radiculopathy, cervical region: Secondary | ICD-10-CM

## 2015-06-13 DIAGNOSIS — M4802 Spinal stenosis, cervical region: Secondary | ICD-10-CM | POA: Diagnosis not present

## 2015-06-14 ENCOUNTER — Telehealth: Payer: Self-pay | Admitting: Neurology

## 2015-06-14 NOTE — Telephone Encounter (Signed)
MRI cervical spine 06/13/2015: Large central disc protrusion at C3-4 with severe spinal stenosis and cord compression. There is cord hyperintensity Extensive spurring at C4-5 with moderate to severe spinal stenosis and cord hyperintensity. Moderate foraminal encroachment bilaterally Moderate to severe spinal stenosis C5-6 with cord hyperintensity. Moderate to severe foraminal encroachment bilaterally Mild spinal stenosis C6-7 with marked foraminal encroachment bilaterally.  Results of MRI discussed personally with patient via telephone. Severe spinal stenosis with cord compression, but he is clinically stable without neck pain or weakness.  Referral will be sent to neurosurgery for asap evaluation.  Roberth Berling K. Posey Pronto, DO

## 2015-06-16 ENCOUNTER — Telehealth: Payer: Self-pay | Admitting: *Deleted

## 2015-06-16 NOTE — Telephone Encounter (Signed)
Patient is taking tizanadine at night but is having muscle spasms during the day.  He was wondering if he could take this during the day.  Informed him that he can per Dr. Posey Pronto.

## 2015-06-16 NOTE — Telephone Encounter (Signed)
Referral sent 

## 2015-06-18 DIAGNOSIS — I10 Essential (primary) hypertension: Secondary | ICD-10-CM | POA: Diagnosis not present

## 2015-06-18 DIAGNOSIS — M4712 Other spondylosis with myelopathy, cervical region: Secondary | ICD-10-CM | POA: Diagnosis not present

## 2015-07-04 DIAGNOSIS — C61 Malignant neoplasm of prostate: Secondary | ICD-10-CM | POA: Diagnosis not present

## 2015-07-15 DIAGNOSIS — C61 Malignant neoplasm of prostate: Secondary | ICD-10-CM | POA: Diagnosis not present

## 2015-07-28 ENCOUNTER — Other Ambulatory Visit: Payer: Self-pay | Admitting: Family Medicine

## 2015-08-05 ENCOUNTER — Telehealth: Payer: Self-pay | Admitting: *Deleted

## 2015-08-05 NOTE — Telephone Encounter (Signed)
Patient called in reference to his medication (tizanidine) thinks it has caused him to stiffing up in other ares  Call back number [phone]

## 2015-08-05 NOTE — Telephone Encounter (Signed)
Called patient back and he said that he has been having trouble walking.  He took aleve and tizanidine yesterday then took 2 more aleve last night which helped some.  He said that he is going to take some aleve today and I instructed him to take the tizanidine at bedtime per Dr. Serita Grit last note.  He agree with plan and will call back if no better in a couple of days.

## 2015-08-13 ENCOUNTER — Other Ambulatory Visit: Payer: Self-pay | Admitting: Family Medicine

## 2015-08-18 ENCOUNTER — Encounter: Payer: Self-pay | Admitting: Adult Health

## 2015-08-18 ENCOUNTER — Ambulatory Visit (INDEPENDENT_AMBULATORY_CARE_PROVIDER_SITE_OTHER): Payer: Medicare Other | Admitting: Adult Health

## 2015-08-18 VITALS — BP 132/80 | Temp 99.5°F | Ht 71.0 in | Wt 192.6 lb

## 2015-08-18 DIAGNOSIS — R197 Diarrhea, unspecified: Secondary | ICD-10-CM

## 2015-08-18 DIAGNOSIS — K4090 Unilateral inguinal hernia, without obstruction or gangrene, not specified as recurrent: Secondary | ICD-10-CM | POA: Diagnosis not present

## 2015-08-18 NOTE — Patient Instructions (Addendum)
It was great meeting you today!  Give your stomach a break for the next 36-48 hours and follow the diaetary guidelines below.   You can trial Immodium to help with the diarrhea.   Continue to stay hydrated  Someone from General Surgery will call you to schedule an appointment about your hernia. If you have any pain, bruising or hardness from the hernia, then please go to the ER.  ood Choices to Help Relieve Diarrhea When you have diarrhea, the foods you eat and your eating habits are very important. Choosing the right foods and drinks can help relieve diarrhea. Also, because diarrhea can last up to 7 days, you need to replace lost fluids and electrolytes (such as sodium, potassium, and chloride) in order to help prevent dehydration.  WHAT GENERAL GUIDELINES DO I NEED TO FOLLOW?  Slowly drink 1 cup (8 oz) of fluid for each episode of diarrhea. If you are getting enough fluid, your urine will be clear or pale yellow.  Eat starchy foods. Some good choices include white rice, white toast, pasta, low-fiber cereal, baked potatoes (without the skin), saltine crackers, and bagels.  Avoid large servings of any cooked vegetables.  Limit fruit to two servings per day. A serving is  cup or 1 small piece.  Choose foods with less than 2 g of fiber per serving.  Limit fats to less than 8 tsp (38 g) per day.  Avoid fried foods.  Eat foods that have probiotics in them. Probiotics can be found in certain dairy products.  Avoid foods and beverages that may increase the speed at which food moves through the stomach and intestines (gastrointestinal tract). Things to avoid include:  High-fiber foods, such as dried fruit, raw fruits and vegetables, nuts, seeds, and whole grain foods.  Spicy foods and high-fat foods.  Foods and beverages sweetened with high-fructose corn syrup, honey, or sugar alcohols such as xylitol, sorbitol, and mannitol. WHAT FOODS ARE RECOMMENDED? Grains White rice. White,  [place], or pita breads (fresh or toasted), including plain rolls, buns, or bagels. White pasta. Saltine, soda, or graham crackers. Pretzels. Low-fiber cereal. Cooked cereals made with water (such as cornmeal, farina, or cream cereals). Plain muffins. Matzo. Melba toast. Zwieback.  Vegetables Potatoes (without the skin). Strained tomato and vegetable juices. Most well-cooked and canned vegetables without seeds. Tender lettuce. Fruits Cooked or canned applesauce, apricots, cherries, fruit cocktail, grapefruit, peaches, pears, or plums. Fresh bananas, apples without skin, cherries, grapes, cantaloupe, grapefruit, peaches, oranges, or plums.  Meat and Other Protein Products Baked or boiled chicken. Eggs. Tofu. Fish. Seafood. Smooth peanut butter. Ground or well-cooked tender beef, ham, veal, lamb, pork, or poultry.  Dairy Plain yogurt, kefir, and unsweetened liquid yogurt. Lactose-free milk, buttermilk, or soy milk. Plain hard cheese. Beverages Sport drinks. Clear broths. Diluted fruit juices (except prune). Regular, caffeine-free sodas such as ginger ale. Water. Decaffeinated teas. Oral rehydration solutions. Sugar-free beverages not sweetened with sugar alcohols. Other Bouillon, broth, or soups made from recommended foods.  The items listed above may not be a complete list of recommended foods or beverages. Contact your dietitian for more options. WHAT FOODS ARE NOT RECOMMENDED? Grains Whole grain, whole wheat, bran, or rye breads, rolls, pastas, crackers, and cereals. Wild or brown rice. Cereals that contain more than 2 g of fiber per serving. Corn tortillas or taco shells. Cooked or dry oatmeal. Granola. Popcorn. Vegetables Raw vegetables. Cabbage, broccoli, Brussels sprouts, artichokes, baked beans, beet greens, corn, kale, legumes, peas, sweet potatoes, and yams. Potato  skins. Cooked spinach and cabbage. Fruits Dried fruit, including raisins and dates. Raw fruits. Stewed or dried prunes. Fresh  apples with skin, apricots, mangoes, pears, raspberries, and strawberries.  Meat and Other Protein Products Chunky peanut butter. Nuts and seeds. Beans and lentils. Berniece Salines.  Dairy High-fat cheeses. Milk, chocolate milk, and beverages made with milk, such as milk shakes. Cream. Ice cream. Sweets and Desserts Sweet rolls, doughnuts, and sweet breads. Pancakes and waffles. Fats and Oils Butter. Cream sauces. Margarine. Salad oils. Plain salad dressings. Olives. Avocados.  Beverages Caffeinated beverages (such as coffee, tea, soda, or energy drinks). Alcoholic beverages. Fruit juices with pulp. Prune juice. Soft drinks sweetened with high-fructose corn syrup or sugar alcohols. Other Coconut. Hot sauce. Chili powder. Mayonnaise. Gravy. Cream-based or milk-based soups.  The items listed above may not be a complete list of foods and beverages to avoid. Contact your dietitian for more information. WHAT SHOULD I DO IF I BECOME DEHYDRATED? Diarrhea can sometimes lead to dehydration. Signs of dehydration include dark urine and dry mouth and skin. If you think you are dehydrated, you should rehydrate with an oral rehydration solution. These solutions can be purchased at pharmacies, retail stores, or online.  Drink -1 cup (120-240 mL) of oral rehydration solution each time you have an episode of diarrhea. If drinking this amount makes your diarrhea worse, try drinking smaller amounts more often. For example, drink 1-3 tsp (5-15 mL) every 5-10 minutes.  A general rule for staying hydrated is to drink 1-2 L of fluid per day. Talk to your health care provider about the specific amount you should be drinking each day. Drink enough fluids to keep your urine clear or pale yellow. Document Released: 03/04/2004 Document Revised: 12/18/2013 Document Reviewed: 11/05/2013 [facility] Patient Information 2015 [place], [place]. This information is not intended to replace advice given to you by your health care provider. Make  sure you discuss any questions you have with your health care provider. Inguinal Hernia, Adult Muscles help keep everything in the body in its proper place. But if a weak spot in the muscles develops, something can poke through. That is called a hernia. When this happens in the lower part of the belly (abdomen), it is called an inguinal hernia. (It takes its name from a part of the body in this region called the inguinal canal.) A weak spot in the wall of muscles lets some fat or part of the small intestine bulge through. An inguinal hernia can develop at any age. Men get them more often than women. CAUSES  In adults, an inguinal hernia develops over time.  It can be triggered by:  Suddenly straining the muscles of the lower abdomen.  Lifting heavy objects.  Straining to have a bowel movement. Difficult bowel movements (constipation) can lead to this.  Constant coughing. This may be caused by smoking or lung disease.  Being overweight.  Being pregnant.  Working at a job that requires long periods of standing or heavy lifting.  Having had an inguinal hernia before. One type can be an emergency situation. It is called a strangulated inguinal hernia. It develops if part of the small intestine slips through the weak spot and cannot get back into the abdomen. The blood supply can be cut off. If that happens, part of the intestine may die. This situation requires emergency surgery. SYMPTOMS  Often, a small inguinal hernia has no symptoms. It is found when a healthcare provider does a physical exam. Larger hernias usually have symptoms.  In adults, symptoms may include:  A lump in the groin. This is easier to see when the person is standing. It might disappear when lying down.  In men, a lump in the scrotum.  Pain or burning in the groin. This occurs especially when lifting, straining or coughing.  A dull ache or feeling of pressure in the groin.  Signs of a strangulated hernia can  include:  A bulge in the groin that becomes very painful and tender to the touch.  A bulge that turns red or purple.  Fever, nausea and vomiting.  Inability to have a bowel movement or to pass gas. DIAGNOSIS  To decide if you have an inguinal hernia, a healthcare provider will probably do a physical examination.  This will include asking questions about any symptoms you have noticed.  The healthcare provider might feel the groin area and ask you to cough. If an inguinal hernia is felt, the healthcare provider may try to slide it back into the abdomen.  Usually no other tests are needed. TREATMENT  Treatments can vary. The size of the hernia makes a difference. Options include:  Watchful waiting. This is often suggested if the hernia is small and you have had no symptoms.  No medical procedure will be done unless symptoms develop.  You will need to watch closely for symptoms. If any occur, contact your healthcare provider right away.  Surgery. This is used if the hernia is larger or you have symptoms.  Open surgery. This is usually an outpatient procedure (you will not stay overnight in a hospital). An cut (incision) is made through the skin in the groin. The hernia is put back inside the abdomen. The weak area in the muscles is then repaired by herniorrhaphy or hernioplasty. Herniorrhaphy: in this type of surgery, the weak muscles are sewn back together. Hernioplasty: a patch or mesh is used to close the weak area in the abdominal wall.  Laparoscopy. In this procedure, a surgeon makes small incisions. A thin tube with a tiny video camera (called a laparoscope) is put into the abdomen. The surgeon repairs the hernia with mesh by looking with the video camera and using two long instruments. HOME CARE INSTRUCTIONS   After surgery to repair an inguinal hernia:  You will need to take pain medicine prescribed by your healthcare provider. Follow all directions carefully.  You will need  to take care of the wound from the incision.  Your activity will be restricted for awhile. This will probably include no heavy lifting for several weeks. You also should not do anything too active for a few weeks. When you can return to work will depend on the type of job that you have.  During "watchful waiting" periods, you should:  Maintain a healthy weight.  Eat a diet high in fiber (fruits, vegetables and whole grains).  Drink plenty of fluids to avoid constipation. This means drinking enough water and other liquids to keep your urine clear or pale yellow.  Do not lift heavy objects.  Do not stand for long periods of time.  Quit smoking. This should keep you from developing a frequent cough. SEEK MEDICAL CARE IF:   A bulge develops in your groin area.  You feel pain, a burning sensation or pressure in the groin. This might be worse if you are lifting or straining.  You develop a fever of more than 100.5 F (38.1 C). SEEK IMMEDIATE MEDICAL CARE IF:   Pain in the groin increases suddenly.  A bulge in the groin gets bigger suddenly and does not go down.  For men, there is sudden pain in the scrotum. Or, the size of the scrotum increases.  A bulge in the groin area becomes red or purple and is painful to touch.  You have nausea or vomiting that does not go away.  You feel your heart beating much faster than normal.  You cannot have a bowel movement or pass gas.  You develop a fever of more than 102.0 F (38.9 C). Document Released: 05/01/2009 Document Revised: 03/06/2012 Document Reviewed: 05/01/2009 [facility] Patient Information 2015 [place], [place]. This information is not intended to replace advice given to you by your health care provider. Make sure you discuss any questions you have with your health care provider.

## 2015-08-18 NOTE — Progress Notes (Signed)
Pre visit review using our clinic review tool, if applicable. No additional management support is needed unless otherwise documented below in the visit note. 

## 2015-08-18 NOTE — Progress Notes (Signed)
Subjective:    Patient ID: Antonio Newton, male    DOB: Dec 21, 1946, 69 y.o.   MRN: [id number]  HPI  69 year old well appearing male who presents to the office today for 1 week of diarrhea. The number of bowel varies throughout the week but the most he has had is 5, yesterday. He has not tried anything over the counter or diarrhea. He has known sick contacts with "stomach virus"    Denies any cramping, blood in stool, fevers, nausea, vomiting, travel, abx use or loss of appetitie. He has not had any new medication changes.   His last colonoscopy was Jan 2016 which was normal.   He also complains of a "bulge" in his right lower abdomen. He first noticed this bulge 2 months ago. Denies any pain or bruising in area of bulge.   Review of Systems  Constitutional: Negative.   Respiratory: Negative.   Cardiovascular: Negative.   Gastrointestinal: Positive for diarrhea and abdominal distention. Negative for nausea, vomiting, abdominal pain, constipation, blood in stool and rectal pain.  Genitourinary: Negative.   Skin: Negative.   All other systems reviewed and are negative.  Past Medical History  Diagnosis Date  . Hypertension   . ED (erectile dysfunction)   . OA (osteoarthritis)     left hip  . Cancer     prostate  . Neuropathy   . Neuromuscular disorder     neuropathy - mildly in bil hands  . Allergy   . Anxiety   . Heart murmur     pt thinks he had a murmur many years ago.  Not sure now.maw    Social History   Social History  . Marital Status: Married    Spouse Name: N/A  . Number of Children: 3  . Years of Education: N/A   Occupational History  . Realtor    Social History Main Topics  . Smoking status: Never Smoker   . Smokeless tobacco: Never Used  . Alcohol Use: 0.0 oz/week    0 Standard drinks or equivalent per week     Comment: Occasionally (few times per month)  . Drug Use: No  . Sexual Activity: Not on file   Other Topics Concern  . Not on file    Social History Narrative   Works as a Nature conservation officer   Lives with wife.  They have three grown children (2 daughters and 1 son).    Past Surgical History  Procedure Laterality Date  . None    . Colonoscopy    . Postate biopsy      Family History  Problem Relation Age of Onset  . Hypertension Father     Deceased, 80  . Hypertension Mother     Deceased, 77  . Healthy Sister   . Esophageal cancer Paternal Grandfather   . Colon cancer Neg Hx   . Rectal cancer Neg Hx   . Stomach cancer Neg Hx     Allergies  Allergen Reactions  . Penicillins     REACTION: family hx    Current Outpatient Prescriptions on File Prior to Visit  Medication Sig Dispense Refill  . amLODipine (NORVASC) 10 MG tablet TAKE ONE TABLET BY MOUTH ONCE DAILY 90 tablet 0  . aspirin 81 MG tablet Take 81 mg by mouth daily.      . calcium-vitamin D 250-100 MG-UNIT per tablet Take 1 tablet by mouth daily.      . fish oil-omega-3 fatty  acids 1000 MG capsule Take 2 g by mouth daily.      Marland Kitchen gabapentin (NEURONTIN) 300 MG capsule Take 1 capsule (300 mg total) by mouth 2 (two) times daily. 180 capsule 3  . hydrochlorothiazide (MICROZIDE) 12.5 MG capsule TAKE ONE CAPSULE BY MOUTH TWICE DAILY 180 capsule 1  . losartan (COZAAR) 100 MG tablet Take 1 tablet (100 mg total) by mouth daily. 100 tablet 3  . multivitamin (THERAGRAN) per tablet Take 1 tablet by mouth daily.      Marland Kitchen omeprazole (PRILOSEC) 20 MG capsule Take 1 capsule (20 mg total) by mouth daily. 30 capsule 5  . sildenafil (VIAGRA) 100 MG tablet Take 1 tablet (100 mg total) by mouth daily as needed. 10 tablet 11  . tiZANidine (ZANAFLEX) 2 MG tablet Take 1 tablet (2 mg total) by mouth at bedtime as needed for muscle spasms. 30 tablet 3  . sucralfate (CARAFATE) 1 GM/10ML suspension Take 1 gram before each meal and at bedtime (4 times a day) (Patient not taking: Reported on 08/18/2015) 420 mL 1   No current facility-administered medications  on file prior to visit.    BP 132/80 mmHg  Temp(Src) 99.5 F (37.5 C) (Oral)  Ht 5\' 11"  (1.803 m)  Wt 192 lb 9.6 oz (87.363 kg)  BMI 26.87 kg/m2       Objective:   Physical Exam  Constitutional: He is oriented to person, place, and time. He appears well-developed and well-nourished. No distress.  Cardiovascular: Normal rate, regular rhythm, normal heart sounds and intact distal pulses.  Exam reveals no gallop and no friction rub.   No murmur heard. Pulmonary/Chest: Effort normal and breath sounds normal. No respiratory distress. He has no wheezes. He has no rales. He exhibits no tenderness.  Abdominal: Soft. Bowel sounds are normal. He exhibits distension. He exhibits no mass. There is no tenderness. There is no rebound and no guarding.  Inguinal hernia in right groin that is easily reducible. No redness, warmth, bruising or pain.   Musculoskeletal: Normal range of motion. He exhibits no edema or tenderness.  Neurological: He is alert and oriented to person, place, and time.  Skin: Skin is warm and dry. No rash noted. He is not diaphoretic. No erythema. No pallor.  Psychiatric: He has a normal mood and affect. His behavior is normal. Judgment and thought content normal.  Nursing note and vitals reviewed.       Assessment & Plan:  1. Diarrhea  -No concern at this time for C-diff or other infectious diarrhea.  - BRAT diet - Stay hydrated - OTC imodium -Imodium until formed BM - Follow up if no improvement in 2-3 days.   2. Unilateral inguinal hernia without obstruction or gangrene, recurrence not specified - Ambulatory referral to General Surgery - Go to the ER with any pain, bruising or hardness to hernia.

## 2015-08-25 ENCOUNTER — Other Ambulatory Visit: Payer: Self-pay | Admitting: Family Medicine

## 2015-09-05 ENCOUNTER — Ambulatory Visit (INDEPENDENT_AMBULATORY_CARE_PROVIDER_SITE_OTHER): Payer: Medicare Other | Admitting: Neurology

## 2015-09-05 ENCOUNTER — Encounter: Payer: Self-pay | Admitting: Neurology

## 2015-09-05 VITALS — BP 130/70 | HR 70 | Ht 71.0 in | Wt 190.4 lb

## 2015-09-05 DIAGNOSIS — M47812 Spondylosis without myelopathy or radiculopathy, cervical region: Secondary | ICD-10-CM

## 2015-09-05 NOTE — Progress Notes (Signed)
Follow-up Visit   Date: 09/05/2015    Antonio Newton MRN: [id number] DOB: Nov 30, 1946   Interim History: Antonio Newton is a 69 y.o. right-handed African American male with hypertension, ED, prostate cancer, and OA returning to the clinic for follow-up of cervical myelopathy with cord compression. The patient was accompanied to the clinic by self.  History of present illness: For at least the past 10 years, he reports having intermittent numbness/tingling of his hands (right > left) which is worse at night and when he is using his hands more (gardening, cutting grass). Symptoms are worse over the thumb and first two fingers. He feels as if his nerve is being pinched. If he stands upright and flexes his neck, he feels as if something is being pulled in the back of his neck. Denies any shooting sensation from his neck into his hands, weakness of his hands, or neck pain. He denies sensory symptoms of his feet.   Of note, he reports to doing a significant amount of typing and sleeping with wrists flexed.  UPDATE 05/30/2015:  Patient was last seen in the clinic on 12/09/2013 for bilateral hand paresthesias.  He has since underwent EMG which showed mild right CTS, mild right C7-8 radiculopathy, as well as left ulnar neuropathy across the elbow.  He presents today with complaints of worsening numbness and stiffness in the hands, especially fingers bilaterally.  He is having having numbness of the right great toe.  He previously had the same symptoms on the left, but this resolved.    He is taking gabapentin 100mg  and 300mg  at bedtime which helps, because he no longer has a "hot foot" or neck twinge.  He does not have any side effects due to it.  He reports doing heavy gardening since February and has noticed more stiffness since then. He endorses mild low back pain and neck discomfort.  UPDATE 09/05/2015:  Patient underwent imaging of his cervical spine which showed large disc protrusion  at C3-4 and C5-6 with severe spinal stenosis and cord compression.  He was evaluated by Dr. Kathyrn Sheriff in June who recommended cervical decompression and fusion, but he is reluctant to proceed with surgery.   He has taken tizadidine for leg cramps, which he has taken twice and helped.   He continues to have numbness/tingling of the forearm and stiffness of the arms.  No new neck pain or worsening symptoms.    Medications:  Current Outpatient Prescriptions on File Prior to Visit  Medication Sig Dispense Refill  . amLODipine (NORVASC) 10 MG tablet TAKE ONE TABLET BY MOUTH ONCE DAILY 90 tablet 0  . aspirin 81 MG tablet Take 81 mg by mouth daily.      . calcium-vitamin D 250-100 MG-UNIT per tablet Take 1 tablet by mouth daily.      . fish oil-omega-3 fatty acids 1000 MG capsule Take 2 g by mouth daily.      Marland Kitchen gabapentin (NEURONTIN) 300 MG capsule Take 1 capsule (300 mg total) by mouth 2 (two) times daily. 180 capsule 3  . hydrochlorothiazide (MICROZIDE) 12.5 MG capsule TAKE ONE CAPSULE BY MOUTH TWICE DAILY 180 capsule 1  . losartan (COZAAR) 100 MG tablet TAKE ONE TABLET BY MOUTH ONCE DAILY 100 tablet 0  . multivitamin (THERAGRAN) per tablet Take 1 tablet by mouth daily.      Marland Kitchen omeprazole (PRILOSEC) 20 MG capsule Take 1 capsule (20 mg total) by mouth daily. 30 capsule 5  . sildenafil (VIAGRA) 100 MG  tablet Take 1 tablet (100 mg total) by mouth daily as needed. 10 tablet 11  . sucralfate (CARAFATE) 1 GM/10ML suspension Take 1 gram before each meal and at bedtime (4 times a day) 420 mL 1  . tiZANidine (ZANAFLEX) 2 MG tablet Take 1 tablet (2 mg total) by mouth at bedtime as needed for muscle spasms. 30 tablet 3   No current facility-administered medications on file prior to visit.    Allergies:  Allergies  Allergen Reactions  . Penicillins     REACTION: family hx    Review of Systems:  CONSTITUTIONAL: No fevers, chills, night sweats, or weight loss.  EYES: No visual changes or eye pain ENT: No  hearing changes.  No history of nose bleeds.   RESPIRATORY: No cough, wheezing and shortness of breath.   CARDIOVASCULAR: Negative for chest pain, and palpitations.   GI: Negative for abdominal discomfort, blood in stools or black stools.  No recent change in bowel habits.   GU:  No history of incontinence.   MUSCLOSKELETAL: +history of joint pain or swelling.  No myalgias.   SKIN: Negative for lesions, rash, and itching.   ENDOCRINE: Negative for cold or heat intolerance, polydipsia or goiter.   PSYCH:  No depression or anxiety symptoms.   NEURO: As Above.   Vital Signs:  BP 130/70 mmHg  Pulse 70  Ht 5\' 11"  (1.803 m)  Wt 190 lb 6 oz (86.354 kg)  BMI 26.56 kg/m2  SpO2 97%   Neurological Exam: MENTAL STATUS including orientation to time, place, person, recent and remote memory, attention span and concentration, language, and fund of knowledge is normal.  Speech is not dysarthric.  CRANIAL NERVES:   Pupils equal round and reactive to light.  Normal conjugate, extra-ocular eye movements in all directions of gaze.  No ptosis. Face is symmetric. Palate elevates symmetrically.  Tongue is midline.  MOTOR:  Motor strength is 5/5 in all extremities.  No pronator drift.  Tone is normal.    MSRs:  Right                                                                 Left brachioradialis 3+  brachioradialis 2+  biceps 3+  biceps 2+  triceps 2+  triceps 2+  patellar 3+  patellar 3+  ankle jerk 2+  ankle jerk 2+  Crossed adductors.   SENSORY:  Intact to vibration and temperature throughout.  COORDINATION/GAIT:  Gait narrow based and stable.   Data: EMG bilateral upper extremities 02/20/2014: 1. Right median neuropathy, at or distal to the wrist, consistent with the clinical diagnosis of carpal tunnel syndrome; very mild in degree electrically. 2. Right intraspinal canal lesion (i.e. radiculopathy) affecting the right C7-C8 nerve root/segments; overall, these findings are mild in degree  electrically. 3. Left ulnar neuropathy with slowing across the elbow, demyelinating in type.   MRI cervical spine 06/13/2015: Large central disc protrusion at C3-4 with severe spinal stenosis and cord compression. There is cord hyperintensity Extensive spurring at C4-5 with moderate to severe spinal stenosis and cord hyperintensity. Moderate foraminal encroachment bilaterally Moderate to severe spinal stenosis C5-6 with cord hyperintensity. Moderate to severe foraminal encroachment bilaterally Mild spinal stenosis C6-7 with marked foraminal encroachment bilaterally.   IMPRESSION/PLAN: Severe cervical myelopathy with  cord compression due to canal stenosis at C3-4 and C5-6 manifesting with bilateral hand paresthesias  - Clinically he is stable, no neck pain  - He did seek neurosurgical evaluation with Dr. Kathyrn Sheriff who recommended cervical decompression and fusion but patient has not decided whether it would like to pursue this or not.  He is considering seeking a second opinion at [facility].  - In the meantime, symptoms are managed with gabapentin 300mg  twice daily and tizanidine 2mg  for cramps as needed  Return to clinic in 6 months   The duration of this appointment visit was 20 minutes of face-to-face time with the patient.  Greater than 50% of this time was spent in counseling, explanation of diagnosis, planning of further management, and coordination of care.   Thank you for allowing me to participate in patient's care.  If I can answer any additional questions, I would be pleased to do so.    Sincerely,    Emylie Amster K. Posey Pronto, DO

## 2015-09-05 NOTE — Patient Instructions (Signed)
Return to clinic in 6 months.

## 2015-09-24 ENCOUNTER — Ambulatory Visit (INDEPENDENT_AMBULATORY_CARE_PROVIDER_SITE_OTHER): Payer: Medicare Other | Admitting: *Deleted

## 2015-09-24 DIAGNOSIS — Z23 Encounter for immunization: Secondary | ICD-10-CM | POA: Diagnosis not present

## 2015-11-18 ENCOUNTER — Ambulatory Visit (INDEPENDENT_AMBULATORY_CARE_PROVIDER_SITE_OTHER): Payer: Medicare Other | Admitting: Family Medicine

## 2015-11-18 ENCOUNTER — Encounter: Payer: Self-pay | Admitting: Family Medicine

## 2015-11-18 VITALS — BP 153/92 | HR 75 | Temp 97.6°F | Ht 71.0 in | Wt 192.0 lb

## 2015-11-18 DIAGNOSIS — J309 Allergic rhinitis, unspecified: Secondary | ICD-10-CM | POA: Diagnosis not present

## 2015-11-18 MED ORDER — AZELASTINE HCL 0.15 % NA SOLN
2.0000 | Freq: Two times a day (BID) | NASAL | Status: DC
Start: 2015-11-18 — End: 2017-08-02

## 2015-11-18 NOTE — Progress Notes (Signed)
   Subjective:    Patient ID: Antonio Newton, male    DOB: 20-Sep-1946, 69 y.o.   MRN: [id number]  HPI Here for several weeks of stuffy nose and sinuses. He does not feel sick, no ST or cough or fever. Using Coricidin and Flonase sprays with no benefit.    Review of Systems  Constitutional: Negative.   HENT: Positive for congestion and sinus pressure. Negative for ear pain, facial swelling, nosebleeds, postnasal drip and sore throat.   Eyes: Negative.   Respiratory: Negative.        Objective:   Physical Exam  Constitutional: He appears well-developed and well-nourished.  HENT:  Right Ear: External ear normal.  Left Ear: External ear normal.  Nose: Nose normal.  Mouth/Throat: Oropharynx is clear and moist. No oropharyngeal exudate.  Eyes: Conjunctivae are normal.  Neck: Neck supple. No thyromegaly present.  Pulmonary/Chest: Effort normal and breath sounds normal.  Lymphadenopathy:    He has no cervical adenopathy.          Assessment & Plan:  Allergies with nasal congestion. Try Azelastine sprays bid and Mucinex.

## 2015-11-18 NOTE — Progress Notes (Signed)
Pre visit review using our clinic review tool, if applicable. No additional management support is needed unless otherwise documented below in the visit note. 

## 2015-12-04 ENCOUNTER — Ambulatory Visit (INDEPENDENT_AMBULATORY_CARE_PROVIDER_SITE_OTHER): Payer: Medicare Other | Admitting: Adult Health

## 2015-12-04 ENCOUNTER — Encounter: Payer: Self-pay | Admitting: Adult Health

## 2015-12-04 VITALS — BP 128/80 | HR 77 | Temp 97.7°F | Ht 71.0 in | Wt 190.1 lb

## 2015-12-04 DIAGNOSIS — J0120 Acute ethmoidal sinusitis, unspecified: Secondary | ICD-10-CM | POA: Diagnosis not present

## 2015-12-04 MED ORDER — DOXYCYCLINE HYCLATE 100 MG PO CAPS: 100.0000 mg | ORAL_CAPSULE | Freq: Two times a day (BID) | ORAL | Status: DC

## 2015-12-04 MED ORDER — METHYLPREDNISOLONE 4 MG PO TBPK: ORAL_TABLET | ORAL | Status: DC

## 2015-12-04 NOTE — Progress Notes (Signed)
   Subjective:    Patient ID: Antonio Newton, male    DOB: 01-26-1946, 69 y.o.   MRN: [id number]  HPI  69 year old male who presents to the office today for continued sinus pain and pressure. He saw Dr. Sarajane Jews on 11/18/2015 and was prescribed Azelastine sprays bid and Mucinex. He had sinus pain and pressure two weeks prior to seeing Dr. Sarajane Jews. He endorses that he is slightly better since starting the nasal spray, but continues to have a lot of sinus pain with rhinorrhea.   Review of Systems  Constitutional: Negative.   HENT: Positive for postnasal drip, rhinorrhea and sinus pressure (Ethmoid sinus). Negative for ear discharge, ear pain and sore throat.   Respiratory: Positive for cough.   Cardiovascular: Negative.   Neurological: Negative.   All other systems reviewed and are negative.      Objective:   Physical Exam  Constitutional: He is oriented to person, place, and time. He appears well-developed and well-nourished. No distress.  HENT:  Head: Normocephalic and atraumatic.  Right Ear: External ear normal.  Left Ear: External ear normal.  Nose: Nose normal.  Mouth/Throat: Oropharynx is clear and moist. No oropharyngeal exudate.  Neck: Normal range of motion. Neck supple.  Cardiovascular: Normal rate, regular rhythm, normal heart sounds and intact distal pulses.  Exam reveals no gallop and no friction rub.   No murmur heard. Pulmonary/Chest: Effort normal and breath sounds normal. No respiratory distress. He has no wheezes. He has no rales. He exhibits no tenderness.  Lymphadenopathy:    He has no cervical adenopathy.  Neurological: He is alert and oriented to person, place, and time.  Skin: Skin is warm and dry. No rash noted. He is not diaphoretic. No erythema. No pallor.  Psychiatric: He has a normal mood and affect. His behavior is normal. Judgment and thought content normal.  Nursing note and vitals reviewed.      Assessment & Plan:  1. Subacute ethmoidal sinusitis -  methylPREDNISolone (MEDROL DOSEPAK) 4 MG TBPK tablet; Take as directed  Dispense: 21 tablet; Refill: 0 - doxycycline (VIBRAMYCIN) 100 MG capsule; Take 1 capsule (100 mg total) by mouth 2 (two) times daily.  Dispense: 14 capsule; Refill: 0 - Stay well hydrated - Follow up if no improvement.

## 2015-12-04 NOTE — Progress Notes (Signed)
Pre visit review using our clinic review tool, if applicable. No additional management support is needed unless otherwise documented below in the visit note. 

## 2015-12-04 NOTE — Patient Instructions (Addendum)
It was great seeing you again.   I have sent in a prescription for Doxycycline 100mg , take this twice a day   Also use the prednisone as directed to help with inflammation  Make sure you are staying well hydrated.   Upper Respiratory Infection, Adult Most upper respiratory infections (URIs) are a viral infection of the air passages leading to the lungs. A URI affects the nose, throat, and upper air passages. The most common type of URI is nasopharyngitis and is typically referred to as "the common cold." URIs run their course and usually go away on their own. Most of the time, a URI does not require medical attention, but sometimes a bacterial infection in the upper airways can follow a viral infection. This is called a secondary infection. Sinus and middle ear infections are common types of secondary upper respiratory infections. Bacterial pneumonia can also complicate a URI. A URI can worsen asthma and chronic obstructive pulmonary disease (COPD). Sometimes, these complications can require emergency medical care and may be life threatening.  CAUSES Almost all URIs are caused by viruses. A virus is a type of germ and can spread from one person to another.  RISKS FACTORS You may be at risk for a URI if:   You smoke.   You have chronic heart or lung disease.  You have a weakened defense (immune) system.   You are very young or very old.   You have nasal allergies or asthma.  You work in crowded or poorly ventilated areas.  You work in health care facilities or schools. SIGNS AND SYMPTOMS  Symptoms typically develop 2-3 days after you come in contact with a cold virus. Most viral URIs last 7-10 days. However, viral URIs from the influenza virus (flu virus) can last 14-18 days and are typically more severe. Symptoms may include:   Runny or stuffy (congested) nose.   Sneezing.   Cough.   Sore throat.   Headache.   Fatigue.   Fever.   Loss of appetite.   Pain in  your forehead, behind your eyes, and over your cheekbones (sinus pain).  Muscle aches.  DIAGNOSIS  Your health care provider may diagnose a URI by:  Physical exam.  Tests to check that your symptoms are not due to another condition such as:  Strep throat.  Sinusitis.  Pneumonia.  Asthma. TREATMENT  A URI goes away on its own with time. It cannot be cured with medicines, but medicines may be prescribed or recommended to relieve symptoms. Medicines may help:  Reduce your fever.  Reduce your cough.  Relieve nasal congestion. HOME CARE INSTRUCTIONS   Take medicines only as directed by your health care provider.   Gargle warm saltwater or take cough drops to comfort your throat as directed by your health care provider.  Use a warm mist humidifier or inhale steam from a shower to increase air moisture. This may make it easier to breathe.  Drink enough fluid to keep your urine clear or pale yellow.   Eat soups and other clear broths and maintain good nutrition.   Rest as needed.   Return to work when your temperature has returned to normal or as your health care provider advises. You may need to stay home longer to avoid infecting others. You can also use a face mask and careful hand washing to prevent spread of the virus.  Increase the usage of your inhaler if you have asthma.   Do not use any tobacco products, including  cigarettes, chewing tobacco, or electronic cigarettes. If you need help quitting, ask your health care provider. PREVENTION  The best way to protect yourself from getting a cold is to practice good hygiene.   Avoid oral or hand contact with people with cold symptoms.   Wash your hands often if contact occurs.  There is no clear evidence that vitamin C, vitamin E, echinacea, or exercise reduces the chance of developing a cold. However, it is always recommended to get plenty of rest, exercise, and practice good nutrition.  SEEK MEDICAL CARE IF:    You are getting worse rather than better.   Your symptoms are not controlled by medicine.   You have chills.  You have worsening shortness of breath.  You have brown or red mucus.  You have yellow or brown nasal discharge.  You have pain in your face, especially when you bend forward.  You have a fever.  You have swollen neck glands.  You have pain while swallowing.  You have white areas in the back of your throat. SEEK IMMEDIATE MEDICAL CARE IF:   You have severe or persistent:  Headache.  Ear pain.  Sinus pain.  Chest pain.  You have chronic lung disease and any of the following:  Wheezing.  Prolonged cough.  Coughing up blood.  A change in your usual mucus.  You have a stiff neck.  You have changes in your:  Vision.  Hearing.  Thinking.  Mood. MAKE SURE YOU:   Understand these instructions.  Will watch your condition.  Will get help right away if you are not doing well or get worse.   This information is not intended to replace advice given to you by your health care provider. Make sure you discuss any questions you have with your health care provider.   Document Released: 06/08/2001 Document Revised: 04/29/2015 Document Reviewed: 03/20/2014 Elsevier Interactive Patient Education [employer].

## 2015-12-23 ENCOUNTER — Telehealth: Payer: Self-pay | Admitting: Family Medicine

## 2015-12-23 MED ORDER — LOSARTAN POTASSIUM 100 MG PO TABS: 100.0000 mg | ORAL_TABLET | Freq: Every day | ORAL | Status: DC

## 2015-12-23 NOTE — Telephone Encounter (Signed)
Pt needs new rx losartan 100 mg #90 w/refills [facility]. Pt does not want blister pak

## 2015-12-23 NOTE — Telephone Encounter (Signed)
Rx sent to pharmacy.  Called to advise pt that he needs to establish care with a new PCP.

## 2015-12-24 NOTE — Telephone Encounter (Signed)
Noted on pt's prescription that pt needs to establish care with new pcp; called and left a message for pt to contact the office to make an appt.

## 2016-01-12 DIAGNOSIS — C61 Malignant neoplasm of prostate: Secondary | ICD-10-CM | POA: Diagnosis not present

## 2016-01-19 DIAGNOSIS — R972 Elevated prostate specific antigen [PSA]: Secondary | ICD-10-CM | POA: Diagnosis not present

## 2016-01-19 DIAGNOSIS — C61 Malignant neoplasm of prostate: Secondary | ICD-10-CM | POA: Diagnosis not present

## 2016-01-19 DIAGNOSIS — Z0000 Encounter for general adult medical examination without abnormal findings: Secondary | ICD-10-CM | POA: Diagnosis not present

## 2016-01-20 ENCOUNTER — Other Ambulatory Visit: Payer: Self-pay | Admitting: Neurosurgery

## 2016-01-28 ENCOUNTER — Encounter (HOSPITAL_COMMUNITY)
Admission: RE | Admit: 2016-01-28 | Discharge: 2016-01-28 | Disposition: A | Discharge status: Home / Self Care | Payer: Medicare Other | Source: Ambulatory Visit | Attending: Neurosurgery | Admitting: Neurosurgery

## 2016-01-28 ENCOUNTER — Encounter (HOSPITAL_COMMUNITY): Payer: Self-pay

## 2016-01-28 DIAGNOSIS — Z01812 Encounter for preprocedural laboratory examination: Secondary | ICD-10-CM | POA: Diagnosis not present

## 2016-01-28 DIAGNOSIS — M4712 Other spondylosis with myelopathy, cervical region: Secondary | ICD-10-CM | POA: Diagnosis not present

## 2016-01-28 DIAGNOSIS — I10 Essential (primary) hypertension: Secondary | ICD-10-CM | POA: Diagnosis not present

## 2016-01-28 DIAGNOSIS — Z01818 Encounter for other preprocedural examination: Secondary | ICD-10-CM | POA: Diagnosis not present

## 2016-01-28 HISTORY — DX: Personal history of other diseases of the digestive system: Z87.19

## 2016-01-28 HISTORY — DX: Pneumonia, unspecified organism: J18.9

## 2016-01-28 LAB — BASIC METABOLIC PANEL
Anion gap: 13 (ref 5–15)
BUN: 15 mg/dL (ref 6–20)
CO2: 30 mmol/L (ref 22–32)
Calcium: 9.7 mg/dL (ref 8.9–10.3)
Chloride: 98 mmol/L — ABNORMAL LOW (ref 101–111)
Creatinine, Ser: 1.06 mg/dL (ref 0.61–1.24)
GFR calc Af Amer: >60 mL/min (ref >60)
Glucose, Bld: 113 mg/dL — ABNORMAL HIGH (ref 65–99)
Potassium: 3.2 mmol/L — ABNORMAL LOW (ref 3.5–5.1)
Sodium: 141 mmol/L (ref 135–145)

## 2016-01-28 LAB — SURGICAL PCR SCREEN
MRSA, PCR: NEGATIVE
STAPHYLOCOCCUS AUREUS: NEGATIVE

## 2016-01-28 LAB — CBC
HCT: 40.3 % (ref 39.0–52.0)
HEMOGLOBIN: 14.4 g/dL (ref 13.0–17.0)
MCH: 30.6 pg (ref 26.0–34.0)
MCHC: 35.7 g/dL (ref 30.0–36.0)
MCV: 85.6 fL (ref 78.0–100.0)
PLATELETS: 158 10*3/uL (ref 150–400)
RBC: 4.71 MIL/uL (ref 4.22–5.81)
RDW: 12 % (ref 11.5–15.5)
WBC: 6.2 10*3/uL (ref 4.0–10.5)

## 2016-02-03 ENCOUNTER — Other Ambulatory Visit: Payer: Self-pay | Admitting: Family Medicine

## 2016-02-05 MED ORDER — VANCOMYCIN HCL IN DEXTROSE 1-5 GM/200ML-% IV SOLN
1000.0000 mg | INTRAVENOUS | Status: DC
  Filled 2016-02-05: qty 200

## 2016-02-06 ENCOUNTER — Inpatient Hospital Stay (HOSPITAL_COMMUNITY)
Admission: RE | Admit: 2016-02-06 | Discharge: 2016-02-07 | DRG: 473 | Disposition: A | Discharge status: Home / Self Care | Payer: Medicare Other | Source: Ambulatory Visit | Attending: Neurosurgery | Admitting: Neurosurgery

## 2016-02-06 ENCOUNTER — Inpatient Hospital Stay (HOSPITAL_COMMUNITY): Payer: Medicare Other

## 2016-02-06 ENCOUNTER — Inpatient Hospital Stay (HOSPITAL_COMMUNITY): Payer: Medicare Other | Admitting: Anesthesiology

## 2016-02-06 ENCOUNTER — Encounter (HOSPITAL_COMMUNITY)
Admission: RE | Disposition: A | Discharge status: Home / Self Care | Payer: Self-pay | Source: Ambulatory Visit | Attending: Neurosurgery

## 2016-02-06 ENCOUNTER — Encounter (HOSPITAL_COMMUNITY): Payer: Self-pay | Admitting: *Deleted

## 2016-02-06 DIAGNOSIS — G629 Polyneuropathy, unspecified: Secondary | ICD-10-CM | POA: Diagnosis not present

## 2016-02-06 DIAGNOSIS — Z419 Encounter for procedure for purposes other than remedying health state, unspecified: Secondary | ICD-10-CM

## 2016-02-06 DIAGNOSIS — R200 Anesthesia of skin: Secondary | ICD-10-CM | POA: Diagnosis present

## 2016-02-06 DIAGNOSIS — M542 Cervicalgia: Secondary | ICD-10-CM | POA: Diagnosis not present

## 2016-02-06 DIAGNOSIS — I10 Essential (primary) hypertension: Secondary | ICD-10-CM | POA: Diagnosis present

## 2016-02-06 DIAGNOSIS — M4712 Other spondylosis with myelopathy, cervical region: Secondary | ICD-10-CM | POA: Diagnosis not present

## 2016-02-06 DIAGNOSIS — M4802 Spinal stenosis, cervical region: Secondary | ICD-10-CM | POA: Diagnosis not present

## 2016-02-06 HISTORY — PX: ANTERIOR CERVICAL DECOMP/DISCECTOMY FUSION: SHX1161

## 2016-02-06 SURGERY — ANTERIOR CERVICAL DECOMPRESSION/DISCECTOMY FUSION 3 LEVELS
Anesthesia: General | Site: Neck

## 2016-02-06 MED ORDER — FENTANYL CITRATE (PF) 100 MCG/2ML IJ SOLN
INTRAMUSCULAR | Status: DC | PRN
  Administered 2016-02-06: 25 ug via INTRAVENOUS
  Administered 2016-02-06 (×3): 50 ug via INTRAVENOUS
  Administered 2016-02-06: 25 ug via INTRAVENOUS
  Administered 2016-02-06 (×4): 50 ug via INTRAVENOUS

## 2016-02-06 MED ORDER — ONDANSETRON HCL 4 MG/2ML IJ SOLN
INTRAMUSCULAR | Status: AC
  Filled 2016-02-06: qty 2

## 2016-02-06 MED ORDER — FLEET ENEMA 7-19 GM/118ML RE ENEM: 1.0000 | ENEMA | Freq: Once | RECTAL | Status: DC | PRN

## 2016-02-06 MED ORDER — DEXAMETHASONE SODIUM PHOSPHATE 4 MG/ML IJ SOLN
INTRAMUSCULAR | Status: DC | PRN
  Administered 2016-02-06: 10 mg via INTRAVENOUS

## 2016-02-06 MED ORDER — MENTHOL 3 MG MT LOZG
1.0000 | LOZENGE | OROMUCOSAL | Status: DC | PRN
  Filled 2016-02-06: qty 9

## 2016-02-06 MED ORDER — PHENYLEPHRINE HCL 10 MG/ML IJ SOLN
INTRAMUSCULAR | Status: DC | PRN
  Administered 2016-02-06 (×2): 40 ug via INTRAVENOUS

## 2016-02-06 MED ORDER — 0.9 % SODIUM CHLORIDE (POUR BTL) OPTIME
TOPICAL | Status: DC | PRN
  Administered 2016-02-06: 1000 mL

## 2016-02-06 MED ORDER — MIDAZOLAM HCL 5 MG/5ML IJ SOLN
INTRAMUSCULAR | Status: DC | PRN
Start: 2016-02-06 — End: 2016-02-06
  Administered 2016-02-06: 2 mg via INTRAVENOUS

## 2016-02-06 MED ORDER — EPHEDRINE SULFATE 50 MG/ML IJ SOLN
INTRAMUSCULAR | Status: DC | PRN
  Administered 2016-02-06: 5 mg via INTRAVENOUS

## 2016-02-06 MED ORDER — PHENYLEPHRINE HCL 10 MG/ML IJ SOLN
INTRAMUSCULAR | Status: AC
  Filled 2016-02-06: qty 3

## 2016-02-06 MED ORDER — POLYETHYLENE GLYCOL 3350 17 G PO PACK: 17.0000 g | PACK | Freq: Every day | ORAL | Status: DC | PRN

## 2016-02-06 MED ORDER — SODIUM CHLORIDE 0.9 % IR SOLN
Status: DC | PRN
  Administered 2016-02-06: 12:00:00

## 2016-02-06 MED ORDER — SODIUM CHLORIDE 0.9 % IV SOLN: INTRAVENOUS | Status: DC

## 2016-02-06 MED ORDER — AMLODIPINE BESYLATE 10 MG PO TABS
10.0000 mg | ORAL_TABLET | Freq: Every day | ORAL | Status: DC
  Filled 2016-02-06: qty 1

## 2016-02-06 MED ORDER — LIDOCAINE-EPINEPHRINE 1 %-1:100000 IJ SOLN
INTRAMUSCULAR | Status: DC | PRN
  Administered 2016-02-06: 3 mL

## 2016-02-06 MED ORDER — LACTATED RINGERS IV SOLN: INTRAVENOUS | Status: DC

## 2016-02-06 MED ORDER — BUPIVACAINE HCL 0.5 % IJ SOLN
INTRAMUSCULAR | Status: DC | PRN
  Administered 2016-02-06: 3 mL

## 2016-02-06 MED ORDER — PROPOFOL 10 MG/ML IV BOLUS
INTRAVENOUS | Status: DC | PRN
  Administered 2016-02-06: 200 mg via INTRAVENOUS

## 2016-02-06 MED ORDER — SENNA 8.6 MG PO TABS
1.0000 | ORAL_TABLET | Freq: Two times a day (BID) | ORAL | Status: DC
  Administered 2016-02-06: 8.6 mg via ORAL
  Filled 2016-02-06: qty 1

## 2016-02-06 MED ORDER — ADULT MULTIVITAMIN W/MINERALS CH: 1.0000 | ORAL_TABLET | Freq: Two times a day (BID) | ORAL | Status: DC

## 2016-02-06 MED ORDER — MIDAZOLAM HCL 2 MG/2ML IJ SOLN
INTRAMUSCULAR | Status: AC
  Filled 2016-02-06: qty 2

## 2016-02-06 MED ORDER — PHENYLEPHRINE HCL 10 MG/ML IJ SOLN
10.0000 mg | INTRAVENOUS | Status: DC | PRN
  Administered 2016-02-06: 50 ug/min via INTRAVENOUS

## 2016-02-06 MED ORDER — ROCURONIUM BROMIDE 50 MG/5ML IV SOLN
INTRAVENOUS | Status: AC
  Filled 2016-02-06: qty 1

## 2016-02-06 MED ORDER — FENTANYL CITRATE (PF) 250 MCG/5ML IJ SOLN
INTRAMUSCULAR | Status: AC
  Filled 2016-02-06: qty 5

## 2016-02-06 MED ORDER — PHENOL 1.4 % MT LIQD: 1.0000 | OROMUCOSAL | Status: DC | PRN

## 2016-02-06 MED ORDER — PROPOFOL 10 MG/ML IV BOLUS
INTRAVENOUS | Status: AC
  Filled 2016-02-06: qty 20

## 2016-02-06 MED ORDER — SUGAMMADEX SODIUM 200 MG/2ML IV SOLN
INTRAVENOUS | Status: DC | PRN
  Administered 2016-02-06: 169.6 mg via INTRAVENOUS

## 2016-02-06 MED ORDER — SODIUM CHLORIDE 0.9 % IV SOLN: 250.0000 mL | INTRAVENOUS | Status: DC

## 2016-02-06 MED ORDER — CEFAZOLIN SODIUM-DEXTROSE 2-3 GM-% IV SOLR
2.0000 g | Freq: Three times a day (TID) | INTRAVENOUS | Status: AC
  Administered 2016-02-06 – 2016-02-07 (×2): 2 g via INTRAVENOUS
  Filled 2016-02-06 (×2): qty 50

## 2016-02-06 MED ORDER — HYDROCHLOROTHIAZIDE 12.5 MG PO CAPS
12.5000 mg | ORAL_CAPSULE | Freq: Two times a day (BID) | ORAL | Status: DC
  Administered 2016-02-06: 12.5 mg via ORAL
  Filled 2016-02-06: qty 1

## 2016-02-06 MED ORDER — LOSARTAN POTASSIUM 50 MG PO TABS
100.0000 mg | ORAL_TABLET | Freq: Every day | ORAL | Status: DC
  Administered 2016-02-06: 100 mg via ORAL
  Filled 2016-02-06: qty 2

## 2016-02-06 MED ORDER — ONDANSETRON HCL 4 MG/2ML IJ SOLN
4.0000 mg | INTRAMUSCULAR | Status: DC | PRN
  Administered 2016-02-06: 4 mg via INTRAVENOUS
  Filled 2016-02-06: qty 2

## 2016-02-06 MED ORDER — CEFAZOLIN SODIUM-DEXTROSE 2-3 GM-% IV SOLR
INTRAVENOUS | Status: DC | PRN
  Administered 2016-02-06: 2 g via INTRAVENOUS

## 2016-02-06 MED ORDER — DEXAMETHASONE SODIUM PHOSPHATE 4 MG/ML IJ SOLN
INTRAMUSCULAR | Status: AC
  Filled 2016-02-06: qty 1

## 2016-02-06 MED ORDER — DOCUSATE SODIUM 100 MG PO CAPS
100.0000 mg | ORAL_CAPSULE | Freq: Two times a day (BID) | ORAL | Status: DC
  Administered 2016-02-06: 100 mg via ORAL
  Filled 2016-02-06: qty 1

## 2016-02-06 MED ORDER — DEXAMETHASONE SODIUM PHOSPHATE 4 MG/ML IJ SOLN
INTRAMUSCULAR | Status: AC
  Filled 2016-02-06: qty 2

## 2016-02-06 MED ORDER — LACTATED RINGERS IV SOLN
INTRAVENOUS | Status: DC | PRN
  Administered 2016-02-06 (×3): via INTRAVENOUS

## 2016-02-06 MED ORDER — SODIUM CHLORIDE 0.9% FLUSH: 3.0000 mL | INTRAVENOUS | Status: DC | PRN

## 2016-02-06 MED ORDER — MORPHINE SULFATE (PF) 2 MG/ML IV SOLN
1.0000 mg | INTRAVENOUS | Status: DC | PRN
  Administered 2016-02-06: 2 mg via INTRAVENOUS
  Filled 2016-02-06: qty 1

## 2016-02-06 MED ORDER — ROCURONIUM BROMIDE 100 MG/10ML IV SOLN
INTRAVENOUS | Status: DC | PRN
Start: 2016-02-06 — End: 2016-02-06
  Administered 2016-02-06: 10 mg via INTRAVENOUS
  Administered 2016-02-06: 50 mg via INTRAVENOUS
  Administered 2016-02-06 (×2): 5 mg via INTRAVENOUS
  Administered 2016-02-06: 10 mg via INTRAVENOUS

## 2016-02-06 MED ORDER — SUGAMMADEX SODIUM 200 MG/2ML IV SOLN
INTRAVENOUS | Status: AC
  Filled 2016-02-06: qty 2

## 2016-02-06 MED ORDER — HYDROMORPHONE HCL 1 MG/ML IJ SOLN: 0.2500 mg | INTRAMUSCULAR | Status: DC | PRN

## 2016-02-06 MED ORDER — ACETAMINOPHEN 650 MG RE SUPP: 650.0000 mg | RECTAL | Status: DC | PRN

## 2016-02-06 MED ORDER — PANTOPRAZOLE SODIUM 40 MG PO TBEC: 40.0000 mg | DELAYED_RELEASE_TABLET | Freq: Every day | ORAL | Status: DC

## 2016-02-06 MED ORDER — LACTATED RINGERS IV SOLN
INTRAVENOUS | Status: DC
  Administered 2016-02-06: 10:00:00 via INTRAVENOUS

## 2016-02-06 MED ORDER — TIZANIDINE HCL 2 MG PO TABS: 2.0000 mg | ORAL_TABLET | Freq: Every evening | ORAL | Status: DC | PRN

## 2016-02-06 MED ORDER — ONDANSETRON HCL 4 MG/2ML IJ SOLN
INTRAMUSCULAR | Status: DC | PRN
Start: 2016-02-06 — End: 2016-02-06
  Administered 2016-02-06: 4 mg via INTRAVENOUS

## 2016-02-06 MED ORDER — LIDOCAINE HCL (CARDIAC) 20 MG/ML IV SOLN
INTRAVENOUS | Status: DC | PRN
  Administered 2016-02-06: 70 mg via INTRAVENOUS

## 2016-02-06 MED ORDER — THROMBIN 20000 UNITS EX SOLR
CUTANEOUS | Status: DC | PRN
  Administered 2016-02-06: 12:00:00 via TOPICAL

## 2016-02-06 MED ORDER — ACETAMINOPHEN 325 MG PO TABS: 650.0000 mg | ORAL_TABLET | ORAL | Status: DC | PRN

## 2016-02-06 MED ORDER — BISACODYL 10 MG RE SUPP: 10.0000 mg | Freq: Every day | RECTAL | Status: DC | PRN

## 2016-02-06 MED ORDER — OXYCODONE-ACETAMINOPHEN 5-325 MG PO TABS
1.0000 | ORAL_TABLET | ORAL | Status: DC | PRN
  Administered 2016-02-06 – 2016-02-07 (×2): 2 via ORAL
  Filled 2016-02-06 (×2): qty 2

## 2016-02-06 MED ORDER — OMEGA-3-ACID ETHYL ESTERS 1 G PO CAPS
1.0000 g | ORAL_CAPSULE | Freq: Every day | ORAL | Status: DC
Start: 2016-02-07 — End: 2016-02-07
  Filled 2016-02-06: qty 1

## 2016-02-06 MED ORDER — ARTIFICIAL TEARS OP OINT
TOPICAL_OINTMENT | OPHTHALMIC | Status: DC | PRN
  Administered 2016-02-06: 1 via OPHTHALMIC

## 2016-02-06 MED ORDER — THROMBIN 5000 UNITS EX SOLR
OROMUCOSAL | Status: DC | PRN
  Administered 2016-02-06: 12:00:00 via TOPICAL

## 2016-02-06 MED ORDER — CALCIUM CARBONATE-VITAMIN D 500-200 MG-UNIT PO TABS: 1.0000 | ORAL_TABLET | Freq: Every day | ORAL | Status: DC

## 2016-02-06 MED ORDER — AZELASTINE HCL 0.1 % NA SOLN: 2.0000 | Freq: Two times a day (BID) | NASAL | Status: DC | PRN

## 2016-02-06 MED ORDER — GABAPENTIN 300 MG PO CAPS
300.0000 mg | ORAL_CAPSULE | Freq: Two times a day (BID) | ORAL | Status: DC
  Administered 2016-02-06: 300 mg via ORAL
  Filled 2016-02-06: qty 1

## 2016-02-06 MED ORDER — SODIUM CHLORIDE 0.9% FLUSH: 3.0000 mL | Freq: Two times a day (BID) | INTRAVENOUS | Status: DC

## 2016-02-06 SURGICAL SUPPLY — 66 items
APL SKNCLS STERI-STRIP NONHPOA (GAUZE/BANDAGES/DRESSINGS)
BAG DECANTER FOR FLEXI CONT (MISCELLANEOUS) ×3 IMPLANT
BENZOIN TINCTURE PRP APPL 2/3 (GAUZE/BANDAGES/DRESSINGS) IMPLANT
BLADE CLIPPER SURG (BLADE) IMPLANT
BLADE SURG 11 STRL SS (BLADE) ×3 IMPLANT
BUR MATCHSTICK NEURO 3.0 LAGG (BURR) ×3 IMPLANT
CAGE EXPANSE 8X6X6 (Cage) ×6 IMPLANT
CAGE PEEK 6X14X11 (Cage) ×6 IMPLANT
CANISTER SUCT 3000ML PPV (MISCELLANEOUS) ×3 IMPLANT
CLOSURE WOUND 1/2 X4 (GAUZE/BANDAGES/DRESSINGS)
DECANTER SPIKE VIAL GLASS SM (MISCELLANEOUS) ×3 IMPLANT
DRAIN CHANNEL 10M FLAT 3/4 FLT (DRAIN) IMPLANT
DRAPE C-ARM 42X72 X-RAY (DRAPES) ×6 IMPLANT
DRAPE LAPAROTOMY 100X72 PEDS (DRAPES) ×3 IMPLANT
DRAPE MICROSCOPE LEICA (MISCELLANEOUS) ×3 IMPLANT
DRAPE POUCH INSTRU U-SHP 10X18 (DRAPES) ×3 IMPLANT
DRAPE PROXIMA HALF (DRAPES) IMPLANT
DRSG OPSITE 4X5.5 SM (GAUZE/BANDAGES/DRESSINGS) ×4 IMPLANT
DRSG OPSITE POSTOP 3X4 (GAUZE/BANDAGES/DRESSINGS) ×1 IMPLANT
DRSG OPSITE POSTOP 4X6 (GAUZE/BANDAGES/DRESSINGS) ×2 IMPLANT
DRSG TEGADERM 4X4.75 (GAUZE/BANDAGES/DRESSINGS) ×4 IMPLANT
DURAPREP 6ML APPLICATOR 50/CS (WOUND CARE) ×3 IMPLANT
ELECT COATED BLADE 2.86 ST (ELECTRODE) ×3 IMPLANT
ELECT REM PT RETURN 9FT ADLT (ELECTROSURGICAL) ×3
ELECTRODE REM PT RTRN 9FT ADLT (ELECTROSURGICAL) ×1 IMPLANT
EVACUATOR SILICONE 100CC (DRAIN) IMPLANT
GAUZE SPONGE 4X4 16PLY XRAY LF (GAUZE/BANDAGES/DRESSINGS) IMPLANT
GLOVE BIOGEL PI IND STRL 7.5 (GLOVE) ×1 IMPLANT
GLOVE BIOGEL PI INDICATOR 7.5 (GLOVE) ×2
GLOVE ECLIPSE 7.0 STRL STRAW (GLOVE) ×3 IMPLANT
GLOVE EXAM NITRILE LRG STRL (GLOVE) IMPLANT
GLOVE EXAM NITRILE MD LF STRL (GLOVE) IMPLANT
GLOVE EXAM NITRILE XL STR (GLOVE) IMPLANT
GLOVE EXAM NITRILE XS STR PU (GLOVE) IMPLANT
GOWN STRL REUS W/ TWL LRG LVL3 (GOWN DISPOSABLE) ×2 IMPLANT
GOWN STRL REUS W/ TWL XL LVL3 (GOWN DISPOSABLE) IMPLANT
GOWN STRL REUS W/TWL 2XL LVL3 (GOWN DISPOSABLE) ×2 IMPLANT
GOWN STRL REUS W/TWL LRG LVL3 (GOWN DISPOSABLE) ×3
GOWN STRL REUS W/TWL XL LVL3 (GOWN DISPOSABLE) ×3
HEMOSTAT POWDER KIT SURGIFOAM (HEMOSTASIS) ×3 IMPLANT
KIT BASIN OR (CUSTOM PROCEDURE TRAY) ×3 IMPLANT
KIT ROOM TURNOVER OR (KITS) ×3 IMPLANT
LIQUID BAND (GAUZE/BANDAGES/DRESSINGS) ×3 IMPLANT
NDL HYPO 25X1 1.5 SAFETY (NEEDLE) ×1 IMPLANT
NDL SPNL 22GX3.5 QUINCKE BK (NEEDLE) ×1 IMPLANT
NEEDLE HYPO 25X1 1.5 SAFETY (NEEDLE) ×3 IMPLANT
NEEDLE SPNL 22GX3.5 QUINCKE BK (NEEDLE) ×3 IMPLANT
NS IRRIG 1000ML POUR BTL (IV SOLUTION) ×3 IMPLANT
PACK LAMINECTOMY NEURO (CUSTOM PROCEDURE TRAY) ×3 IMPLANT
PAD ARMBOARD 7.5X6 YLW CONV (MISCELLANEOUS) ×7 IMPLANT
PLATE 3 65XLCK NS SPNE CVD (Plate) IMPLANT
PLATE 3 ATLANTIS TRANS (Plate) ×3 IMPLANT
RUBBERBAND STERILE (MISCELLANEOUS) ×6 IMPLANT
SCREW SELF TAP VAR 4.0X13 (Screw) ×16 IMPLANT
SPONGE INTESTINAL PEANUT (DISPOSABLE) ×3 IMPLANT
SPONGE SURGIFOAM ABS GEL 100 (HEMOSTASIS) ×3 IMPLANT
STRIP CLOSURE SKIN 1/2X4 (GAUZE/BANDAGES/DRESSINGS) IMPLANT
SUT ETHILON 3 0 FSL (SUTURE) IMPLANT
SUT VIC AB 3-0 SH 8-18 (SUTURE) ×3 IMPLANT
SUT VICRYL 3-0 RB1 18 ABS (SUTURE) ×5 IMPLANT
TAPE CLOTH 2X10 TAN LF (GAUZE/BANDAGES/DRESSINGS) ×1 IMPLANT
TAPE CLOTH 4X10 WHT NS (GAUZE/BANDAGES/DRESSINGS) ×2 IMPLANT
TOWEL OR 17X24 6PK STRL BLUE (TOWEL DISPOSABLE) ×3 IMPLANT
TOWEL OR 17X26 10 PK STRL BLUE (TOWEL DISPOSABLE) ×3 IMPLANT
TRAY FOLEY CATH 16FRSI W/METER (SET/KITS/TRAYS/PACK) ×2 IMPLANT
WATER STERILE IRR 1000ML POUR (IV SOLUTION) ×3 IMPLANT

## 2016-02-06 NOTE — Anesthesia Procedure Notes (Addendum)
Procedure Name: Intubation Date/Time: 02/06/2016 10:43 AM Performed by: Suzy Bouchard Pre-anesthesia Checklist: Patient identified, Timeout performed, Emergency Drugs available, Suction available and Patient being monitored Patient Re-evaluated:Patient Re-evaluated prior to inductionOxygen Delivery Method: Circle system utilized Preoxygenation: Pre-oxygenation with 100% oxygen Ventilation: Mask ventilation without difficulty Laryngoscope Size: Glidescope and 3 Grade View: Grade I Tube type: Oral Tube size: 7.5 mm Number of attempts: 1 Airway Equipment and Method: Stylet and Video-laryngoscopy Placement Confirmation: ETT inserted through vocal cords under direct vision,  CO2 detector and breath sounds checked- equal and bilateral Secured at: 21 cm Tube secured with: Tape Dental Injury: Teeth and Oropharynx as per pre-operative assessment  Comments: Neck neutral for DL and intubation/ elective glidescope

## 2016-02-06 NOTE — Transfer of Care (Signed)
Immediate Anesthesia Transfer of Care Note  Patient: Antonio Newton  Procedure(s) Performed: Procedure(s): Cevical three-four, Cervical four-five, Cervical five-six anterior cervical decompression with fusion interbody prosthesis plating and bonegraft (N/A)  Patient Location: PACU  Anesthesia Type:General  Level of Consciousness: sedated  Airway & Oxygen Therapy: Patient Spontanous Breathing and Patient connected to nasal cannula oxygen  Post-op Assessment: Report given to RN, Post -op Vital signs reviewed and stable and Patient moving all extremities  Post vital signs: Reviewed and stable  Last Vitals:  Filed Vitals:   02/06/16 0937  BP: 171/87  Pulse: 81  Temp: 36.9 C  Resp: 20    Complications: No apparent anesthesia complications

## 2016-02-06 NOTE — Progress Notes (Signed)
Utilization review completed.  

## 2016-02-06 NOTE — H&P (Signed)
CC:  Numbness  HPI: Antonio Newton is a 70 year old man I'm seeing for numbness in both of his hands for many years, with his only pain symptoms being a feeling of "vibration" which runs down his spine occasionally. He was initially placed on Neurontin, and has had good improvement in this vibration-type pain. Over the last couple of months he says that the numbness in both of his hands has been worsening, with the left being worse than the right. He does admit to difficulty discriminating objects by touch especially on the left hand. He has also been having difficulty with fine movements including buttoning small buttons on a shirt, or grabbing small zippers. He does admit to bilateral leg stiffness occasionally, as well as intermittent numbness of both feet. He has not had any difficulty with balance or falls. He has not had any changes in bowel or bladder symptoms. He actually denies any significant neck or arm pain.   PMH: Past Medical History  Diagnosis Date  . Hypertension   . ED (erectile dysfunction)   . OA (osteoarthritis)     left hip  . Cancer [facility])     prostate  . Neuropathy ([place])   . Neuromuscular disorder (HCC)     neuropathy - mildly in bil hands  . Allergy   . Anxiety   . Heart murmur     pt thinks he had a murmur many years ago.  Not sure now.maw  . Pneumonia   . History of hiatal hernia     inguninal    PSH: Past Surgical History  Procedure Laterality Date  . None    . Colonoscopy    . Postate biopsy      SH: Social History  Substance Use Topics  . Smoking status: Never Smoker   . Smokeless tobacco: Never Used  . Alcohol Use: 0.0 oz/week    0 Standard drinks or equivalent per week     Comment: Occasionally (few times per month)    MEDS: Prior to Admission medications   Medication Sig Start Date End Date Taking? Authorizing Provider  amLODipine (NORVASC) 10 MG tablet TAKE ONE TABLET BY MOUTH ONCE DAILY 02/03/16  Yes Dorena Cookey, MD  aspirin 81 MG  tablet Take 81 mg by mouth daily.     Yes Historical Provider, MD  Azelastine HCl 0.15 % SOLN Place 2 sprays into the nose 2 (two) times daily. Patient taking differently: Place 2 sprays into the nose 2 (two) times daily as needed (allergies).  11/18/15  Yes Laurey Morale, MD  calcium-vitamin D 250-100 MG-UNIT per tablet Take 1 tablet by mouth daily.     Yes Historical Provider, MD  gabapentin (NEURONTIN) 300 MG capsule Take 1 capsule (300 mg total) by mouth 2 (two) times daily. 05/30/15  Yes Donika K Patel, DO  hydrochlorothiazide (MICROZIDE) 12.5 MG capsule TAKE ONE CAPSULE BY MOUTH TWICE DAILY 07/28/15  Yes Dorena Cookey, MD  losartan (COZAAR) 100 MG tablet Take 1 tablet (100 mg total) by mouth daily. 12/23/15  Yes Dorothyann Peng, NP  multivitamin [facility]) per tablet Take 1 tablet by mouth 2 (two) times daily.    Yes Historical Provider, MD  Omega-3 Fatty Acids (FISH OIL TRIPLE STRENGTH) 1400 MG CAPS Take 1,400 mg by mouth daily.   Yes Historical Provider, MD  omeprazole (PRILOSEC) 20 MG capsule TAKE ONE CAPSULE BY MOUTH ONCE DAILY 02/03/16  Yes Dorena Cookey, MD  sildenafil (VIAGRA) 100 MG tablet Take 1 tablet (100 mg  total) by mouth daily as needed. Patient not taking: Reported on 01/26/2016 01/14/15   Dorena Cookey, MD  tiZANidine (ZANAFLEX) 2 MG tablet Take 1 tablet (2 mg total) by mouth at bedtime as needed for muscle spasms. 05/30/15   Donika Keith Rake, DO    ALLERGY: Allergies  Allergen Reactions  . Penicillins Other (See Comments)    REACTION: family hx    ROS: ROS  NEUROLOGIC EXAM: Awake, alert, oriented Memory and concentration grossly intact Speech fluent, appropriate CN grossly intact Motor exam: Upper Extremities Deltoid Bicep Tricep Grip  Right 5/5 5/5 5/5 5/5  Left 5/5 5/5 5/5 5/5   Lower Extremity IP Quad PF DF EHL  Right 5/5 5/5 5/5 5/5 5/5  Left 5/5 5/5 5/5 5/5 5/5   Sensation grossly intact to LT Increased DTR bilateral patella, achilles (+) clonus  bilaterally  IMGAING: MRI of the cervical spine was reviewed. This demonstrates fairly large central disc herniation at C3 C4 with cord compression, and intrinsic T2 signal change. Similarly, at C4 C5 there is broad-based disc bulge with spinal cord compression, and T2 signal change. At C5 C6 there is again central disc bulge with spinal cord compression and intrinsic spinal cord signal change. At C6 C7, there is bilateral, left greater than right foraminal disc osteophyte complex causing severe foraminal stenosis, but no significant central spinal cord stenosis.  IMPRESSION: 70 year old man with symptomatic cervical spondylotic myelopathy, at C3 C4, C4 C5, and C5 C6. Although he does have significant radiographic foraminal disease at C6 C7, this does not appear to be symptomatic at this time.  PLAN: - Proceed with ACDF at C3 C4, C4 C5, and C5 C6.   I have reviewed the MRI findings with the patient and his wife in the office. My recommendation for surgery given the spinal cord compression and signal change was reviewed. The risks of surgery were discussed in detail with the patient which include but are not limited to spinal cord injury which may result in hand, leg, and bowel dysfunction, postoperative dysphagia, dysphonia, neck hematoma, or subsequent surgery for epidural hematoma. The risk of CSF leak was also discussed. In addition, I explained to him that after spinal fusion surgery, there is a risk of adjacent level disease requiring future surgical intervention.

## 2016-02-06 NOTE — Anesthesia Preprocedure Evaluation (Addendum)
Anesthesia Evaluation  Patient identified by MRN, date of birth, ID band Patient awake    Reviewed: Allergy & Precautions, H&P , NPO status , Patient's Chart, lab work & pertinent test results  Airway Mallampati: II  TM Distance: >3 FB Neck ROM: full    Dental  (+) Dental Advisory Given, Caps 3 upper front capped One cap (middle one) is missing.:   Pulmonary neg pulmonary ROS,    Pulmonary exam normal breath sounds clear to auscultation       Cardiovascular Exercise Tolerance: Good hypertension, Pt. on medications negative cardio ROS Normal cardiovascular exam Rhythm:regular Rate:Normal     Neuro/Psych Neuropathy in hands negative neurological ROS  negative psych ROS   GI/Hepatic negative GI ROS, Neg liver ROS, hiatal hernia, GERD  Medicated and Controlled,  Endo/Other  negative endocrine ROS  Renal/GU negative Renal ROS  negative genitourinary   Musculoskeletal   Abdominal   Peds  Hematology negative hematology ROS (+)   Anesthesia Other Findings   Reproductive/Obstetrics negative OB ROS                           Anesthesia Physical Anesthesia Plan  ASA: II  Anesthesia Plan: General   Post-op Pain Management:    Induction: Intravenous  Airway Management Planned: Oral ETT  Additional Equipment:   Intra-op Plan:   Post-operative Plan: Extubation in OR  Informed Consent: I have reviewed the patients History and Physical, chart, labs and discussed the procedure including the risks, benefits and alternatives for the proposed anesthesia with the patient or authorized representative who has indicated his/her understanding and acceptance.   Dental Advisory Given  Plan Discussed with: CRNA and Surgeon  Anesthesia Plan Comments:         Anesthesia Quick Evaluation

## 2016-02-06 NOTE — Op Note (Signed)
PREOP DIAGNOSIS: Cervical Spondylosis with myelopathy, C3-4, C4-5, C5-6  POSTOP DIAGNOSIS: Same  PROCEDURE: 1. Discectomy at C3-4, C4-5, C5-6 for decompression of spinal cord and exiting nerve roots  2. Placement of intervertebral biomechanical device, 68mm lordotic @ C3-4, 54mm lordotic @ C4-5, C6-7 3. Placement of anterior instrumentation consisting of interbody plate and screws, C3, C4, C5, C6 4. Use of non-structural bone allograft  5. Arthrodesis C3-4, C4-5, C5-6, anterior interbody technique  6. Use of intraoperative microscope  SURGEON: Dr. Consuella Lose, MD  ASSISTANT: Dr. Janece Canterbury, MD  ANESTHESIA: General Endotracheal  EBL: 100cc  SPECIMENS: None  DRAINS: None  COMPLICATIONS: None immediate   CONDITION: Hemodynamically stable to PACU  HISTORY: Antonio Newton is a 70 y.o. man initially seen in the office with symptoms and signs c/w cervical myelopathy. His MRI demonstrated severe spinal cord compression at C3-4, C4-5, and C5-6. Surgical decompression and fusion was therefore indicated. Risks and benefits were reviewed with the patient and his family. After all questions were answered, consent was obtained.  PROCEDURE IN DETAIL: The patient was brought to the operating room and transferred to the operative table. After induction of general anesthesia, the patient was positioned on the operative table in the supine position with all pressure points meticulously padded. The skin of the neck was then prepped and draped in the usual sterile fashion.  After timeout was conducted, the skin was infiltrated with local anesthetic. Skin incision was then made sharply and Bovie electrocautery was used to dissect the subcutaneous tissue until the platysma was identified. The platysma was then divided and undermined. The sternocleidomastoid muscle was then identified and, utilizing natural fascial planes in the neck, the prevertebral fascia was identified and the carotid sheath was  retracted laterally and the trachea and esophagus retracted medially. Again using fluoroscopy, the correct disc spaces were identified. Bovie electrocautery was used to dissect in the subperiosteal plane and elevate the bilateral longus coli muscles. Table mounted retractors were then placed. At this point, the microscope was draped and brought into the field, and the remainder of the case was done under the microscope using microdissecting technique.  The C3-4 disc space was incised sharply and rongeurs were use to initially complete a discectomy. The high-speed drill was then used to complete discectomy until the posterior annulus was identified and removed and the posterior longitudinal ligament was identified. Using a nerve hook, the PLL was elevated, and Kerrison rongeurs were used to remove the posterior longitudinal ligament and the ventral thecal sac was identified. I did note a significant amount of disc material superficial to the PLL which was causing noticeable compression of the thecal sac. Using a combination of curettes and rongeurs, complete decompression of the thecal sac and exiting nerve roots at this level was completed, and verified using micro-nerve hook.  At this point, a 93mm interbody cage was sized and packed with bone allograft. This was then inserted and tapped into place.  Attention was then turned to the C4-5 level. In a similar fashion, discectomy was completed initially with curettes and rongeurs, and completed with the drill. The PLL was again identified, elevated and incised. Again noted significant disc material ventral to the PLL causing compression. Using Kerrison rongeurs, decompression of the spinal cord and exiting roots at the C4-5 level was completed and confirmed with a dissector. A 72mm lordotic interbody cage was then sized and filled with bone allograft, and tapped into place.   Attention was then turned to the C5-6 level.  In a similar fashion, discectomy was  completed initially with curettes and rongeurs, and completed with the drill. The PLL was again identified, elevated and incised. Using Kerrison rongeurs, decompression of the spinal cord and exiting roots at the C5-6 level was completed and confirmed with a dissector. In removal of the ligament and disc material on the left, the dura appeared denuded and a small amount of CSF weeping was noted. After decompression, this was covered with gelfoam.  A 34mm lordotic interbody cage was then sized and filled with bone allograft, and tapped into place. Position of the interbody devices was then confirmed with fluoroscopy.  After placement of the intervertebral devices, the Atlantis translational 63mm anterior cervical plate was selected, and placed across the interspaces. Using a high-speed drill, the cortex of the cervical vertebral bodies was punctured, and screws inserted in the C3, C4, C5, C6 levels. Final fluoroscopic images in lateral projections were taken to confirm good hardware placement.  At this point, after all counts were verified to be correct, meticulous hemostasis was secured using a combination of bipolar electrocautery and passive hemostatics. The platysma muscle was then closed using interrupted 3-0 Vicryl sutures, and the skin was closed with a running subcuticular stitch. Sterile dressings were then applied and the drapes removed.  The patient tolerated the procedure well and was extubated in the room and taken to the postanesthesia care unit in stable condition.

## 2016-02-06 NOTE — Anesthesia Postprocedure Evaluation (Signed)
Anesthesia Post Note  Patient: Antonio Newton  Procedure(s) Performed: Procedure(s) (LRB): Cevical three-four, Cervical four-five, Cervical five-six anterior cervical decompression with fusion interbody prosthesis plating and bonegraft (N/A)  Patient location during evaluation: PACU Anesthesia Type: General Level of consciousness: awake and alert Pain management: pain level controlled Vital Signs Assessment: post-procedure vital signs reviewed and stable Respiratory status: spontaneous breathing, nonlabored ventilation, respiratory function stable and patient connected to nasal cannula oxygen Cardiovascular status: blood pressure returned to baseline and stable Postop Assessment: no signs of nausea or vomiting Anesthetic complications: no    Last Vitals:  Filed Vitals:   02/06/16 1608 02/06/16 1643  BP:  133/72  Pulse: 88 87  Temp: 36.6 C 36.5 C  Resp: 24 20    Last Pain:  Filed Vitals:   02/06/16 1643  PainSc: 4                  Cartina Brousseau L

## 2016-02-07 MED ORDER — OXYCODONE-ACETAMINOPHEN 5-325 MG PO TABS: 1.0000 | ORAL_TABLET | Freq: Four times a day (QID) | ORAL | Status: DC | PRN

## 2016-02-07 NOTE — Evaluation (Signed)
Occupational Therapy Evaluation Patient Details Name: Antonio Newton MRN: [id number] DOB: Sep 17, 1946 Today's Date: 02/07/2016    History of Present Illness pt presents after C3-6 ACDF.  pt with hx of HTN, Prostate CA, Neuropathy, and Anxiety.     Clinical Impression   Pt reports he was independent with ADLs PTA. Currently pt is overall close supervision for ADLs and functional mobility. Pt required VCs throughout functional activities as reminder to maintain cervical precautions; wife present and aware of providing reminders for precautions. Cervical, ADL, and safety education complete; pt and family with no further questions or concerns for OT. Pt planning to d/c home with 24/7 supervision from family. Pt ready to d/c from OT standpoint; signing off at this time. Thank you for this referral.    Follow Up Recommendations  No OT follow up;Supervision - Intermittent    Equipment Recommendations  None recommended by OT    Recommendations for Other Services       Precautions / Restrictions Precautions Precautions: Cervical Precaution Comments: Reviewed cervical precautions with pt, wife, and daughter. Restrictions Weight Bearing Restrictions: No      Mobility Bed Mobility Overal bed mobility: Needs Assistance Bed Mobility: Rolling;Sit to Sidelying Rolling: Supervision Sidelying to sit: Supervision     Sit to sidelying: Supervision General bed mobility comments: cues for log roll technique, but no physical A needed.  Transfers Overall transfer level: Needs assistance Equipment used: None Transfers: Sit to/from Stand Sit to Stand: Supervision         General transfer comment: pt demonstrates good awareness and use of UEs.     Balance                                            ADL Overall ADL's : Needs assistance/impaired Eating/Feeding: Set up;Sitting   Grooming: Supervision/safety;Standing Grooming Details (indicate cue type and reason):  Educated on use of cup for oral care. Upper Body Bathing: Supervision/ safety;Standing   Lower Body Bathing: Supervison/ safety   Upper Body Dressing : Supervision/safety;Standing;Cueing for safety Upper Body Dressing Details (indicate cue type and reason): Pt able to doff robe and hospital gown then don undershirt and shirt with supervision for safety in standing. VCs provided for safety and maintaining cervical precautions; recommending that wife provide close supervision with dressing and bathing to assist with pt maintaining cervical precautions. Lower Body Dressing: Supervision/safety;Sit to/from stand;Cueing for safety Lower Body Dressing Details (indicate cue type and reason): Pt able to don underwear, pants, and socks with supervision for sit to stand. Educated on sitting for LB dressing for safety; pt able to return demo. Toilet Transfer: Supervision/safety;Ambulation;Regular Glass blower/designer Details (indicate cue type and reason): Simulated by transfer from EOB. Toileting- Clothing Manipulation and Hygiene: Supervision/safety;Sit to/from stand       Functional mobility during ADLs: Supervision/safety General ADL Comments: Pts wife and daughter present for OT eval. Educated on home safety, need for supervision with tub transfer/bathing/dressing to maintain cervical precautions, log roll technique, placing frequently used items at counter top height; pt and family verbalize understanding.     Vision     Perception     Praxis      Pertinent Vitals/Pain Pain Assessment: 0-10 Pain Score: 2  Pain Location: sore throat when swallowing Pain Descriptors / Indicators: Sore Pain Intervention(s): Monitored during session;Premedicated before session;Repositioned     Hand Dominance Right   Extremity/Trunk  Assessment Upper Extremity Assessment Upper Extremity Assessment: RUE deficits/detail;LUE deficits/detail RUE Deficits / Details: Slight numbness/tingling in fingers;  present prior to sx. Pt reports he feels like its getting a little better. LUE Deficits / Details: Slight numbness/tingling in fingers; present prior to sx. Pt reports he feels like its getting a little better.   Lower Extremity Assessment Lower Extremity Assessment: Defer to PT evaluation   Cervical / Trunk Assessment Cervical / Trunk Assessment: Normal   Communication Communication Communication: No difficulties   Cognition Arousal/Alertness: Awake/alert Behavior During Therapy: WFL for tasks assessed/performed Overall Cognitive Status: Within Functional Limits for tasks assessed                     General Comments       Exercises       Shoulder Instructions      Home Living Family/patient expects to be discharged to:: Private residence Living Arrangements: Spouse/significant other Available Help at Discharge: Family;Available 24 hours/day Type of Home: House Home Access: Stairs to enter [employer] of Steps: 5 Entrance Stairs-Rails: Right Home Layout: Two level;1/2 bath on main level Alternate Level Stairs-Number of Steps: flight Alternate Level Stairs-Rails: Right;Left Bathroom Shower/Tub: Tub/shower unit;Curtain Shower/tub characteristics: Architectural technologist: Standard     Home Equipment: None          Prior Functioning/Environment Level of Independence: Independent             OT Diagnosis: Generalized weakness;Acute pain   OT Problem List:     OT Treatment/Interventions:      OT Goals(Current goals can be found in the care plan section) Acute Rehab OT Goals Patient Stated Goal: Home OT Goal Formulation: All assessment and education complete, DC therapy  OT Frequency:     Barriers to D/C:            Co-evaluation              End of Session Nurse Communication: Other (comment) (no equipment needs, no follow up OT)  Activity Tolerance: Patient tolerated treatment well Patient left: in bed;with call  bell/phone within reach;with family/visitor present   Time: [id number] OT Time Calculation (min): 16 min Charges:  OT General Charges $OT Visit: 1 Procedure OT Evaluation $OT Eval Low Complexity: 1 Procedure G-Codes:     Binnie Kand M.S., OTR/L Pager: [phone]  02/07/2016, 9:12 AM

## 2016-02-07 NOTE — Progress Notes (Signed)
Pt and wife given D/C instructions with Rx, verbal understanding was provided. Pt's incision is covered with Honeycomb dressing and is clean and dry. Pt's IV was removed prior to D/C. Pt D/C'd home via wheelchair @ 0945 per MD order. Pt is stable @ D/C and has no other needs at this time. Holli Humbles, RN

## 2016-02-07 NOTE — Discharge Instructions (Signed)
Anterior Cervical Diskectomy and Fusion °Anterior cervical diskectomy and fusion is a surgery that is done on the neck (cervical spine) to take pressure off of the nerves or the spinal cord. It is performed through the front (anterior) part of the neck. During this surgery, the damaged disk that is causing pain, numbness, or weakness is removed. The area where the disk was removed is filled with a plastic spacer implant, a bone graft, or both. These implants and bone grafts take pressure off of the nerves and spinal cord by making more room for the nerves to leave the spine. °LET YOUR HEALTH CARE PROVIDER KNOW ABOUT: °· Any allergies you have. °· All medicines you are taking, including vitamins, herbs, eye drops, creams, and over-the-counter medicines. °· Previous problems you or members of your family have had with the use of anesthetics. °· Any blood disorders you have. °· Previous surgeries you have had. °· Any medical conditions you may have. °RISKS AND COMPLICATIONS °Generally, this is a safe procedure. However, problems may occur, including: °· Infection. °· Bleeding with the possible need for blood transfusion. °· Injury to surrounding structures, including nerves. °· Leakage of fluid from the brain or spinal cord (cerebrospinal fluid). °· Blood clots. °· Temporary breathing difficulties after surgery. °BEFORE THE PROCEDURE °· Follow your health care provider's instructions about eating or drinking restrictions. °· Ask your health care provider about: °¨ Changing or stopping your regular medicines. This is especially important if you are taking diabetes medicines or blood thinners. °¨ Taking medicines such as aspirin and ibuprofen. These medicines can thin your blood. Do not take these medicines before your procedure if your health care provider instructs you not to. °· You may be given antibiotic medicines to help prevent infection. °· Your incision site may be marked on your neck. °PROCEDURE °· An IV tube  will be inserted into one of your veins. °· You will be given one or more of the following: °¨ A medicine that helps you relax (sedative). °¨ A medicine that makes you fall asleep (general anesthetic). °· A breathing tube will be placed. °· Your neck will be cleaned with a germ-killing solution (antiseptic). °· Your surgeon will make an incision on the front of your neck, usually within a skinfold line. °· Your neck muscles will be spread apart, and the damaged disk and bone spurs will be removed. °· The area where the disk was removed will be filled with a small plastic spacer implant, a bone graft, or both. °· Your surgeon may put metal plates and screws (hardware) in your neck. This helps to stabilize the surgical site and keep implants and bone grafts in place. The hardware reduces motion at the surgical site so your bones can grow together (fuse). This provides extra support to your neck. °· The incision will be closed with stitches (sutures). °· A bandage (dressing) will be applied to cover the incision. °The procedure may vary among health care providers and hospitals. °AFTER THE PROCEDURE °· Your blood pressure, heart rate, breathing rate, and blood oxygen level will be monitored often until the medicines you were given have worn off.  °· You will be monitored for any signs of complications from the procedure, such as: °¨ Too much bleeding from the incision site. °¨ A buildup of blood under your skin at the surgical site. °¨ Difficulty breathing. °· You may continue to receive antibiotics. °· You can start to eat as soon as you feel comfortable. °· You may be given   a neck brace to wear after surgery. This brace limits your neck movement while your bones are fusing together. Follow your health care provider's instructions about how often and how long you need to wear this. °  °This information is not intended to replace advice given to you by your health care provider. Make sure you discuss any questions you  have with your health care provider. °  °Document Released: 12/01/2009 Document Revised: 01/03/2015 Document Reviewed: 12/01/2009 °Elsevier Interactive Patient Education ©2016 [employer] Inc[employer] ° ° ° °

## 2016-02-07 NOTE — Discharge Summary (Signed)
Physician Discharge Summary  Patient ID: Antonio Newton MRN: [id number] DOB/AGE: 06/17/1946 70 y.o.  Admit date: 02/06/2016 Discharge date: 02/07/2016  Admission Diagnoses:  Cervical stenosis    Discharge Diagnoses: Same   Discharged Condition: good  Hospital Course: The patient was admitted on 02/06/2016 and taken to the operating room where the patient underwent ACDF. The patient tolerated the procedure well and was taken to the recovery room and then to the floor in stable condition. The hospital course was routine. There were no complications. The wound remained clean dry and intact. Pt had appropriate neck soreness. No complaints of arm pain or new N/T/W. The patient remained afebrile with stable vital signs, and tolerated a regular diet. The patient continued to increase activities, and pain was well controlled with oral pain medications.   Consults: None  Significant Diagnostic Studies:  Results for orders placed or performed during the hospital encounter of 01/28/16  Surgical pcr screen  Result Value Ref Range   MRSA, PCR NEGATIVE NEGATIVE   Staphylococcus aureus NEGATIVE NEGATIVE  CBC  Result Value Ref Range   WBC 6.2 4.0 - 10.5 K/uL   RBC 4.71 4.22 - 5.81 MIL/uL   Hemoglobin 14.4 13.0 - 17.0 g/dL   HCT 40.3 39.0 - 52.0 %   MCV 85.6 78.0 - 100.0 fL   MCH 30.6 26.0 - 34.0 pg   MCHC 35.7 30.0 - 36.0 g/dL   RDW 12.0 11.5 - 15.5 %   Platelets 158 150 - 400 K/uL  Basic metabolic panel  Result Value Ref Range   Sodium 141 135 - 145 mmol/L   Potassium 3.2 (L) 3.5 - 5.1 mmol/L   Chloride 98 (L) 101 - 111 mmol/L   CO2 30 22 - 32 mmol/L   Glucose, Bld 113 (H) 65 - 99 mg/dL   BUN 15 6 - 20 mg/dL   Creatinine, Ser 1.06 0.61 - 1.24 mg/dL   Calcium 9.7 8.9 - 10.3 mg/dL   GFR calc non Af Amer >60 >60 mL/min   GFR calc Af Amer >60 >60 mL/min   Anion gap 13 5 - 15    Dg Cervical Spine 1 View  02/06/2016  CLINICAL DATA:  Neck pain. EXAM: DG C-ARM 61-120 MIN; DG CERVICAL  SPINE - 1 VIEW COMPARISON:  MRI cervical spine 06/13/2015. FINDINGS: Intraoperative spot film demonstrates C3-C6 ACDF. Satisfactory position and alignment. Endotracheal tube. Anterior sponge. IMPRESSION: As above. Electronically Signed   By: Staci Righter M.D.   On: 02/06/2016 15:03   Dg C-arm 1-60 Min  02/06/2016  CLINICAL DATA:  Neck pain. EXAM: DG C-ARM 61-120 MIN; DG CERVICAL SPINE - 1 VIEW COMPARISON:  MRI cervical spine 06/13/2015. FINDINGS: Intraoperative spot film demonstrates C3-C6 ACDF. Satisfactory position and alignment. Endotracheal tube. Anterior sponge. IMPRESSION: As above. Electronically Signed   By: Staci Righter M.D.   On: 02/06/2016 15:03    Antibiotics:  Anti-infectives    Start     Dose/Rate Route Frequency Ordered Stop   02/06/16 1900  ceFAZolin (ANCEF) IVPB 2 g/50 mL premix     2 g 100 mL/hr over 30 Minutes Intravenous Every 8 hours 02/06/16 1643 02/07/16 0230   02/06/16 1142  bacitracin 50,000 Units in sodium chloride irrigation 0.9 % 500 mL irrigation  Status:  Discontinued       As needed 02/06/16 1143 02/06/16 1437   02/06/16 1030  vancomycin (VANCOCIN) IVPB 1000 mg/200 mL premix  Status:  Discontinued     1,000 mg 200 mL/hr over  60 Minutes Intravenous To [facility] 02/05/16 1326 02/06/16 1616      Discharge Exam: Blood pressure 115/64, pulse 85, temperature 99.3 F (37.4 C), temperature source Oral, resp. rate 18, height 6' (1.829 m), weight 84.823 kg (187 lb), SpO2 94 %. Neurologic: Grossly normal Dressing dry  Discharge Medications:     Medication List    TAKE these medications        amLODipine 10 MG tablet  Commonly known as:  NORVASC  TAKE ONE TABLET BY MOUTH ONCE DAILY     aspirin 81 MG tablet  Take 81 mg by mouth daily.     Azelastine HCl 0.15 % Soln  Place 2 sprays into the nose 2 (two) times daily.     calcium-vitamin D 250-100 MG-UNIT tablet  Take 1 tablet by mouth daily.     FISH OIL TRIPLE STRENGTH 1400 MG Caps  Take 1,400  mg by mouth daily.     gabapentin 300 MG capsule  Commonly known as:  NEURONTIN  Take 1 capsule (300 mg total) by mouth 2 (two) times daily.     hydrochlorothiazide 12.5 MG capsule  Commonly known as:  MICROZIDE  TAKE ONE CAPSULE BY MOUTH TWICE DAILY     losartan 100 MG tablet  Commonly known as:  COZAAR  Take 1 tablet (100 mg total) by mouth daily.     multivitamin per tablet  Take 1 tablet by mouth 2 (two) times daily.     omeprazole 20 MG capsule  Commonly known as:  PRILOSEC  TAKE ONE CAPSULE BY MOUTH ONCE DAILY     oxyCODONE-acetaminophen 5-325 MG tablet  Commonly known as:  PERCOCET/ROXICET  Take 1-2 tablets by mouth every 6 (six) hours as needed for moderate pain.     sildenafil 100 MG tablet  Commonly known as:  VIAGRA  Take 1 tablet (100 mg total) by mouth daily as needed.     tiZANidine 2 MG tablet  Commonly known as:  ZANAFLEX  Take 1 tablet (2 mg total) by mouth at bedtime as needed for muscle spasms.        Disposition: Home   Final Dx: ACDF      Discharge Instructions     Remove dressing in 72 hours    Complete by:  As directed      Call MD for:  difficulty breathing, headache or visual disturbances    Complete by:  As directed      Call MD for:  persistant nausea and vomiting    Complete by:  As directed      Call MD for:  redness, tenderness, or signs of infection (pain, swelling, redness, odor or green/yellow discharge around incision site)    Complete by:  As directed      Call MD for:  severe uncontrolled pain    Complete by:  As directed      Call MD for:  temperature >100.4    Complete by:  As directed      Diet - low sodium heart healthy    Complete by:  As directed      Discharge instructions    Complete by:  As directed   No lifting, no driving, no strenuous activity, may shower     Increase activity slowly    Complete by:  As directed            Follow-up Information    Follow up with [facility], NEELESH, C, MD. Schedule an  appointment as soon  as possible for a visit in 2 weeks.   Specialty:  Neurosurgery   Contact information:   [address] [place] [address] [phone]        Signed: Eustace Moore 02/07/2016, 8:45 AM

## 2016-02-07 NOTE — Evaluation (Signed)
Physical Therapy Evaluation Patient Details Name: Antonio Newton MRN: [id number] DOB: 1946-08-05 Today's Date: 02/07/2016   History of Present Illness  pt presents after C3-6 ACDF.  pt with hx of HTN, Prostate CA, Neuropathy, and Anxiety.    Clinical Impression  Pt moving near baseline level of mobility and without need for A.  Pt/family ed on activity level for home and need for f/u with MD prior to pt resuming yard activities he is interested in finishing.  No further acute PT needs at this time, will sign off.      Follow Up Recommendations No PT follow up;Supervision for mobility/OOB    Equipment Recommendations  None recommended by PT    Recommendations for Other Services       Precautions / Restrictions Precautions Precautions: Cervical Restrictions Weight Bearing Restrictions: No      Mobility  Bed Mobility Overal bed mobility: Needs Assistance Bed Mobility: Rolling;Sidelying to Sit Rolling: Supervision Sidelying to sit: Supervision       General bed mobility comments: cues for log roll technique, but no physical A needed.  Transfers Overall transfer level: Needs assistance Equipment used: None Transfers: Sit to/from Stand Sit to Stand: Supervision         General transfer comment: pt demonstrates good awareness and use of UEs.  Indicates mild dizziness upon standing, but quickly resolves.    Ambulation/Gait Ambulation/Gait assistance: Supervision Ambulation Distance (Feet): 200 Feet Assistive device: None Gait Pattern/deviations: Step-through pattern;Decreased stride length;Antalgic     General Gait Details: pt antalgic on L LE and indicates this is baseline for him when he first gets up.  No overt balance deficits.    Stairs Stairs: Yes Stairs assistance: Supervision Stair Management: One rail Right;Alternating pattern;Forwards Number of Stairs: 10 General stair comments: pt demonstrates good safety on stairs.    Wheelchair Mobility     Modified Rankin (Stroke Patients Only)       Balance                                             Pertinent Vitals/Pain Pain Assessment: 0-10 Pain Score: 2  Pain Location: Throat is sore. Pain Descriptors / Indicators: Sore Pain Intervention(s): Monitored during session;Premedicated before session;Repositioned    Home Living Family/patient expects to be discharged to:: Private residence Living Arrangements: Spouse/significant other Available Help at Discharge: Family;Available 24 hours/day Type of Home: House Home Access: Stairs to enter Entrance Stairs-Rails: Right Entrance Stairs-Number of Steps: 5 Home Layout: Two level;1/2 bath on main level Home Equipment: None      Prior Function Level of Independence: Independent               Hand Dominance        Extremity/Trunk Assessment   Upper Extremity Assessment: Defer to OT evaluation           Lower Extremity Assessment:  (Hx Neuropathies, Overall good strength)      Cervical / Trunk Assessment: Normal  Communication   Communication: No difficulties  Cognition Arousal/Alertness: Awake/alert Behavior During Therapy: WFL for tasks assessed/performed Overall Cognitive Status: Within Functional Limits for tasks assessed                      General Comments      Exercises        Assessment/Plan    PT Assessment Patent does not  need any further PT services  PT Diagnosis Difficulty walking   PT Problem List    PT Treatment Interventions     PT Goals (Current goals can be found in the Care Plan section) Acute Rehab PT Goals Patient Stated Goal: Home PT Goal Formulation: All assessment and education complete, DC therapy    Frequency     Barriers to discharge        Co-evaluation               End of Session Equipment Utilized During Treatment: Gait belt Activity Tolerance: Patient tolerated treatment well Patient left: in bed;with call bell/phone  within reach;with family/visitor present (Sitting EOB) Nurse Communication: Mobility status         Time: [id number] PT Time Calculation (min) (ACUTE ONLY): 16 min   Charges:   PT Evaluation $PT Eval Low Complexity: 1 Procedure     PT G CodesCatarina Hartshorn, [place] [phone] 02/07/2016, 8:16 AM

## 2016-02-09 ENCOUNTER — Encounter (HOSPITAL_COMMUNITY): Payer: Self-pay | Admitting: Neurosurgery

## 2016-02-19 DIAGNOSIS — M4712 Other spondylosis with myelopathy, cervical region: Secondary | ICD-10-CM | POA: Diagnosis not present

## 2016-03-22 ENCOUNTER — Other Ambulatory Visit: Payer: Self-pay | Admitting: Family Medicine

## 2016-03-31 ENCOUNTER — Other Ambulatory Visit: Payer: Self-pay | Admitting: Adult Health

## 2016-03-31 ENCOUNTER — Encounter: Payer: Self-pay | Admitting: Family Medicine

## 2016-04-01 MED ORDER — LOSARTAN POTASSIUM 100 MG PO TABS: 100.0000 mg | ORAL_TABLET | Freq: Every day | ORAL | Status: DC

## 2016-07-16 DIAGNOSIS — R972 Elevated prostate specific antigen [PSA]: Secondary | ICD-10-CM | POA: Diagnosis not present

## 2016-07-20 ENCOUNTER — Other Ambulatory Visit: Payer: Self-pay | Admitting: Family Medicine

## 2016-07-23 DIAGNOSIS — E291 Testicular hypofunction: Secondary | ICD-10-CM | POA: Diagnosis not present

## 2016-07-23 DIAGNOSIS — C61 Malignant neoplasm of prostate: Secondary | ICD-10-CM | POA: Diagnosis not present

## 2016-08-02 ENCOUNTER — Other Ambulatory Visit: Payer: Self-pay | Admitting: Neurology

## 2016-08-02 NOTE — Telephone Encounter (Signed)
Rx sent with note stating that patient needs an appointment.  No refills given.

## 2016-08-04 DIAGNOSIS — M4712 Other spondylosis with myelopathy, cervical region: Secondary | ICD-10-CM | POA: Diagnosis not present

## 2016-08-04 DIAGNOSIS — I10 Essential (primary) hypertension: Secondary | ICD-10-CM | POA: Diagnosis not present

## 2016-08-25 ENCOUNTER — Other Ambulatory Visit: Payer: Self-pay | Admitting: Family Medicine

## 2016-09-09 ENCOUNTER — Telehealth: Payer: Self-pay | Admitting: Neurology

## 2016-09-09 NOTE — Telephone Encounter (Signed)
Antonio Newton Nov 23, 2046. He has an appointment with Dr.Patel on 10/11/16 and on the wait list as well. He does not have enough Gabapentin to last him until that appointmentt.He uses Paediatric nurse on [address].  His # is [phone]. Thank you

## 2016-09-10 ENCOUNTER — Other Ambulatory Visit: Payer: Self-pay | Admitting: *Deleted

## 2016-09-10 MED ORDER — GABAPENTIN 300 MG PO CAPS: 300.0000 mg | ORAL_CAPSULE | Freq: Two times a day (BID) | ORAL | 1 refills | Status: DC

## 2016-09-10 NOTE — Telephone Encounter (Signed)
Rx sent with one refill.

## 2016-09-16 NOTE — Progress Notes (Signed)
Follow-up Visit   Date: 09/17/16    Antonio Newton MRN: [id number] DOB: [birth year]   Interim History: Antonio Newton is a 70 y.o. right-handed African American male with hypertension, ED, prostate cancer, and OA returning to the clinic for follow-up of cervical myelopathy with cord compression. The patient was accompanied to the clinic by self.  History of present illness: For at least the past 10 years, he reports having intermittent numbness/tingling of his hands (right > left) which is worse at night and when he is using his hands more (gardening, cutting grass). Symptoms are worse over the thumb and first two fingers. He feels as if his nerve is being pinched. If he stands upright and flexes his neck, he feels as if something is being pulled in the back of his neck. Denies any shooting sensation from his neck into his hands, weakness of his hands, or neck pain. He denies sensory symptoms of his feet.   Of note, he reports to doing a significant amount of typing and sleeping with wrists flexed.  UPDATE 05/30/2015:  Patient was last seen in the clinic on 12/09/2013 for bilateral hand paresthesias.  He has since underwent EMG which showed mild right CTS, mild right C7-8 radiculopathy, as well as left ulnar neuropathy across the elbow.  He presents today with complaints of worsening numbness and stiffness in the hands, especially fingers bilaterally.  He is having having numbness of the right great toe.  He previously had the same symptoms on the left, but this resolved.    He is taking gabapentin 100mg  and 300mg  at bedtime which helps, because he no longer has a "hot foot" or neck twinge.  He does not have any side effects due to it.  He reports doing heavy gardening since February and has noticed more stiffness since then. He endorses mild low back pain and neck discomfort.  UPDATE 09/05/2015:  Patient underwent imaging of his cervical spine which showed large disc protrusion at  C3-4 and C5-6 with severe spinal stenosis and cord compression.  He was evaluated by Dr. Kathyrn Sheriff in June who recommended cervical decompression and fusion, but he is reluctant to proceed with surgery.   He has taken tizadidine for leg cramps, which he has taken twice and helped.   He continues to have numbness/tingling of the forearm and stiffness of the arms.  No new neck pain or worsening symptoms.    UPDATE 09/16/2016:  Patient underwent ACDF at C3-C4, C4-C5, and C5-6 in February 2017 by Dr. Kathyrn Sheriff for cervical Spondylosis with myelopathy.  He feels better and sensation of the hands has improved some.  He continues to have numbness over the fingertips.  His cramps have also significantly improved and now he only gets rare cramps of the arms.    Medications:  Current Outpatient Prescriptions on File Prior to Visit  Medication Sig Dispense Refill  . amLODipine (NORVASC) 10 MG tablet TAKE ONE TABLET BY MOUTH ONCE DAILY 90 tablet 1  . aspirin 81 MG tablet Take 81 mg by mouth daily.      . Azelastine HCl 0.15 % SOLN Place 2 sprays into the nose 2 (two) times daily. (Patient taking differently: Place 2 sprays into the nose 2 (two) times daily as needed (allergies). ) 30 mL 5  . calcium-vitamin D 250-100 MG-UNIT per tablet Take 1 tablet by mouth daily.      Marland Kitchen gabapentin (NEURONTIN) 300 MG capsule Take 1 capsule (300 mg total) by mouth 2 (  two) times daily. 60 capsule 1  . hydrochlorothiazide (MICROZIDE) 12.5 MG capsule TAKE ONE CAPSULE BY MOUTH TWICE DAILY 180 capsule 1  . losartan (COZAAR) 100 MG tablet Take 1 tablet (100 mg total) by mouth daily. 90 tablet 0  . multivitamin (THERAGRAN) per tablet Take 1 tablet by mouth 2 (two) times daily.     . Omega-3 Fatty Acids (FISH OIL TRIPLE STRENGTH) 1400 MG CAPS Take 1,400 mg by mouth daily.    Marland Kitchen omeprazole (PRILOSEC) 20 MG capsule TAKE ONE CAPSULE BY MOUTH ONCE DAILY 30 capsule 5  . oxyCODONE-acetaminophen (PERCOCET/ROXICET) 5-325 MG tablet Take 1-2  tablets by mouth every 6 (six) hours as needed for moderate pain. 50 tablet 0  . sildenafil (VIAGRA) 100 MG tablet Take 1 tablet (100 mg total) by mouth daily as needed. 10 tablet 11  . tiZANidine (ZANAFLEX) 2 MG tablet Take 1 tablet (2 mg total) by mouth at bedtime as needed for muscle spasms. 30 tablet 3   No current facility-administered medications on file prior to visit.     Allergies:  Allergies  Allergen Reactions  . Penicillins Other (See Comments)    REACTION: family hx    Review of Systems:  CONSTITUTIONAL: No fevers, chills, night sweats, or weight loss.  EYES: No visual changes or eye pain ENT: No hearing changes.  No history of nose bleeds.   RESPIRATORY: No cough, wheezing and shortness of breath.   CARDIOVASCULAR: Negative for chest pain, and palpitations.   GI: Negative for abdominal discomfort, blood in stools or black stools.  No recent change in bowel habits.   GU:  No history of incontinence.   MUSCLOSKELETAL: +history of joint pain or swelling.  No myalgias.   SKIN: Negative for lesions, rash, and itching.   ENDOCRINE: Negative for cold or heat intolerance, polydipsia or goiter.   PSYCH:  No depression or anxiety symptoms.   NEURO: As Above.   Vital Signs:  BP 130/80   Pulse 77   Ht 5\' 11"  (1.803 m)   Wt 187 lb 2 oz (84.9 kg)   SpO2 98%   BMI 26.10 kg/m   Neurological Exam: MENTAL STATUS including orientation to time, place, person, recent and remote memory, attention span and concentration, language, and fund of knowledge is normal.  Speech is not dysarthric.  CRANIAL NERVES:   Pupils equal round and reactive to light.  Normal conjugate, extra-ocular eye movements in all directions of gaze.  No ptosis. Face is symmetric. Palate elevates symmetrically.  Tongue is midline.  MOTOR:  Motor strength is 5/5 in all extremities.  No pronator drift.  Tone is normal.    MSRs:  Right                                                                  Left brachioradialis 2+  brachioradialis 2+  biceps 2+  biceps 2+  triceps 2+  triceps 2+  patellar 3+  patellar 3+  ankle jerk 2+  ankle jerk 2+  Crossed adductors.   SENSORY:  Intact to vibration and temperature throughout.  COORDINATION/GAIT:  Gait narrow based and stable.   Data: EMG bilateral upper extremities 02/20/2014: 1. Right median neuropathy, at or distal to the wrist, consistent with the clinical diagnosis of carpal  tunnel syndrome; very mild in degree electrically. 2. Right intraspinal canal lesion (i.e. radiculopathy) affecting the right C7-C8 nerve root/segments; overall, these findings are mild in degree electrically. 3. Left ulnar neuropathy with slowing across the elbow, demyelinating in type.   MRI cervical spine 06/13/2015: Large central disc protrusion at C3-4 with severe spinal stenosis and cord compression. There is cord hyperintensity Extensive spurring at C4-5 with moderate to severe spinal stenosis and cord hyperintensity. Moderate foraminal encroachment bilaterally Moderate to severe spinal stenosis C5-6 with cord hyperintensity. Moderate to severe foraminal encroachment bilaterally Mild spinal stenosis C6-7 with marked foraminal encroachment bilaterally.   IMPRESSION/PLAN: 1.  Cervical myelopathy with cord compression due to canal stenosis at C3-4 and C5-6 manifesting with bilateral hand paresthesias, s/p ACDF at C3-C4, C4-C5, and C5-6 in February 2017, clinically doing great.  He does not have any more painful paresthesias, but continues to have numbness of the hands, which is may be likely long-term deficits.  We will try to taper gabapentin to see if he still needs to take the medication.  Reduce gabapentin to 300mg  at bedtime x 2 weeks, then stop. If he develops any worsening paresthesias, he will restart the medication.    2.  Hyperreflexia of the legs, suspect due to lumbar canal stenosis.  There is no weakness or sensory changes on exam and he denies any  symptoms, except low back pain. Recommend continuing home exercises and  tizanidine 2mg  for cramps as needed  Return to clinic as needed  The duration of this appointment visit was 25 minutes of face-to-face time with the patient.  Greater than 50% of this time was spent in counseling, explanation of diagnosis, planning of further management, and coordination of care.   Thank you for allowing me to participate in patient's care.  If I can answer any additional questions, I would be pleased to do so.    Sincerely,    Donika K. Posey Pronto, DO

## 2016-09-17 ENCOUNTER — Ambulatory Visit (INDEPENDENT_AMBULATORY_CARE_PROVIDER_SITE_OTHER): Payer: Medicare Other | Admitting: Neurology

## 2016-09-17 ENCOUNTER — Encounter: Payer: Self-pay | Admitting: Neurology

## 2016-09-17 VITALS — BP 130/80 | HR 77 | Ht 71.0 in | Wt 187.1 lb

## 2016-09-17 DIAGNOSIS — Z981 Arthrodesis status: Secondary | ICD-10-CM

## 2016-09-17 DIAGNOSIS — M47812 Spondylosis without myelopathy or radiculopathy, cervical region: Secondary | ICD-10-CM

## 2016-09-17 DIAGNOSIS — R202 Paresthesia of skin: Secondary | ICD-10-CM | POA: Diagnosis not present

## 2016-09-17 NOTE — Patient Instructions (Addendum)
Great to see you today!  Reduce gabapentin to 300mg  at bedtime x 2 weeks, then stop.  If you develop worsening tingling or pain, restart gabapentin as you were taking.  Return to clinic as needed

## 2016-09-21 ENCOUNTER — Ambulatory Visit (INDEPENDENT_AMBULATORY_CARE_PROVIDER_SITE_OTHER): Payer: Medicare Other | Admitting: Emergency Medicine

## 2016-09-21 DIAGNOSIS — Z23 Encounter for immunization: Secondary | ICD-10-CM | POA: Diagnosis not present

## 2016-10-11 ENCOUNTER — Ambulatory Visit: Payer: Medicare Other | Admitting: Neurology

## 2016-10-26 ENCOUNTER — Other Ambulatory Visit: Payer: Self-pay | Admitting: Adult Health

## 2016-10-26 NOTE — Telephone Encounter (Signed)
Dr. Todd patient  

## 2016-10-26 NOTE — Telephone Encounter (Signed)
Should this be refilled by you or Dr. Sherren Mocha?

## 2016-10-27 NOTE — Telephone Encounter (Signed)
Pt need new Rx for losartan  Pharm:  [facility].  Pt is out of his medication.

## 2016-10-28 ENCOUNTER — Other Ambulatory Visit: Payer: Self-pay

## 2016-10-28 MED ORDER — LOSARTAN POTASSIUM 100 MG PO TABS: 100.0000 mg | ORAL_TABLET | Freq: Every day | ORAL | 1 refills | Status: DC

## 2016-10-28 NOTE — Telephone Encounter (Signed)
Dr. Todd patient  

## 2016-10-28 NOTE — Telephone Encounter (Signed)
Pt calling to check the status of his Rx and I have spoke with Sharyn Lull concerning this matter and will manage it.

## 2016-11-08 ENCOUNTER — Telehealth: Payer: Self-pay | Admitting: Neurology

## 2016-11-08 NOTE — Telephone Encounter (Signed)
Left message for patient to call me back. 

## 2016-11-08 NOTE — Telephone Encounter (Signed)
PT called in regards to his gabapentin prescription/Dawn CB# [phone]

## 2016-11-09 ENCOUNTER — Other Ambulatory Visit: Payer: Self-pay | Admitting: *Deleted

## 2016-11-09 MED ORDER — GABAPENTIN 300 MG PO CAPS: 300.0000 mg | ORAL_CAPSULE | Freq: Two times a day (BID) | ORAL | 3 refills | Status: DC

## 2016-11-09 NOTE — Telephone Encounter (Signed)
Patient called stating that he is back to taking 2 po bid and it is helping.  He requested new Rx for 90 day supply.  Rx sent.

## 2016-12-01 ENCOUNTER — Other Ambulatory Visit (INDEPENDENT_AMBULATORY_CARE_PROVIDER_SITE_OTHER): Payer: Medicare Other

## 2016-12-01 DIAGNOSIS — Z0000 Encounter for general adult medical examination without abnormal findings: Secondary | ICD-10-CM | POA: Diagnosis not present

## 2016-12-01 LAB — LIPID PANEL
CHOL/HDL RATIO: 4
Cholesterol: 183 mg/dL (ref 0–200)
HDL: 43.2 mg/dL (ref >39.00)
LDL CALC: 117 mg/dL — AB (ref 0–99)
NONHDL: 139.46
Triglycerides: 110 mg/dL (ref 0.0–149.0)
VLDL: 22 mg/dL (ref 0.0–40.0)

## 2016-12-01 LAB — CBC WITH DIFFERENTIAL/PLATELET
BASOS PCT: 0.3 % (ref 0.0–3.0)
Basophils Absolute: 0 10*3/uL (ref 0.0–0.1)
EOS ABS: 0.4 10*3/uL (ref 0.0–0.7)
EOS PCT: 6.9 % — AB (ref 0.0–5.0)
HCT: 41.4 % (ref 39.0–52.0)
HEMOGLOBIN: 14.1 g/dL (ref 13.0–17.0)
LYMPHS ABS: 1.2 10*3/uL (ref 0.7–4.0)
Lymphocytes Relative: 18.7 % (ref 12.0–46.0)
MCHC: 33.9 g/dL (ref 30.0–36.0)
MCV: 88 fl (ref 78.0–100.0)
MONO ABS: 0.4 10*3/uL (ref 0.1–1.0)
Monocytes Relative: 6.4 % (ref 3.0–12.0)
NEUTROS PCT: 67.7 % (ref 43.0–77.0)
Neutro Abs: 4.2 10*3/uL (ref 1.4–7.7)
Platelets: 210 10*3/uL (ref 150.0–400.0)
RBC: 4.71 Mil/uL (ref 4.22–5.81)
RDW: 12.8 % (ref 11.5–15.5)
WBC: 6.2 10*3/uL (ref 4.0–10.5)

## 2016-12-01 LAB — HEPATIC FUNCTION PANEL
ALK PHOS: 60 U/L (ref 39–117)
ALT: 21 U/L (ref 0–53)
AST: 19 U/L (ref 0–37)
Albumin: 4.3 g/dL (ref 3.5–5.2)
BILIRUBIN DIRECT: 0.1 mg/dL (ref 0.0–0.3)
Total Bilirubin: 0.9 mg/dL (ref 0.2–1.2)
Total Protein: 7.1 g/dL (ref 6.0–8.3)

## 2016-12-01 LAB — BASIC METABOLIC PANEL
BUN: 22 mg/dL (ref 6–23)
CALCIUM: 9.3 mg/dL (ref 8.4–10.5)
CO2: 33 mEq/L — ABNORMAL HIGH (ref 19–32)
Chloride: 100 mEq/L (ref 96–112)
Creatinine, Ser: 1.12 mg/dL (ref 0.40–1.50)
GFR: 83.37 mL/min (ref >60.00)
GLUCOSE: 105 mg/dL — AB (ref 70–99)
POTASSIUM: 3.1 meq/L — AB (ref 3.5–5.1)
SODIUM: 140 meq/L (ref 135–145)

## 2016-12-01 LAB — POC URINALSYSI DIPSTICK (AUTOMATED)
Bilirubin, UA: NEGATIVE
Blood, UA: NEGATIVE
GLUCOSE UA: NEGATIVE
Ketones, UA: NEGATIVE
Leukocytes, UA: NEGATIVE
NITRITE UA: NEGATIVE
Spec Grav, UA: 1.02
Urobilinogen, UA: 0.2
pH, UA: 7

## 2016-12-01 LAB — PSA: PSA: 8.04 ng/mL — ABNORMAL HIGH (ref 0.10–4.00)

## 2016-12-01 LAB — TSH: TSH: 1.99 u[IU]/mL (ref 0.35–4.50)

## 2016-12-07 ENCOUNTER — Ambulatory Visit (INDEPENDENT_AMBULATORY_CARE_PROVIDER_SITE_OTHER): Payer: Medicare Other | Admitting: Family Medicine

## 2016-12-07 ENCOUNTER — Encounter: Payer: Self-pay | Admitting: Family Medicine

## 2016-12-07 VITALS — BP 140/80 | HR 77 | Temp 98.1°F | Ht 73.0 in | Wt 187.2 lb

## 2016-12-07 DIAGNOSIS — C61 Malignant neoplasm of prostate: Secondary | ICD-10-CM | POA: Diagnosis not present

## 2016-12-07 DIAGNOSIS — I10 Essential (primary) hypertension: Secondary | ICD-10-CM

## 2016-12-07 DIAGNOSIS — Z981 Arthrodesis status: Secondary | ICD-10-CM

## 2016-12-07 MED ORDER — LOSARTAN POTASSIUM-HCTZ 100-12.5 MG PO TABS: 1.0000 | ORAL_TABLET | Freq: Every day | ORAL | 4 refills | Status: DC

## 2016-12-07 MED ORDER — OMEPRAZOLE 20 MG PO CPDR: 20.0000 mg | DELAYED_RELEASE_CAPSULE | Freq: Every day | ORAL | 5 refills | Status: DC

## 2016-12-07 MED ORDER — AMLODIPINE BESYLATE 10 MG PO TABS: 10.0000 mg | ORAL_TABLET | Freq: Every day | ORAL | 4 refills | Status: DC

## 2016-12-07 NOTE — Progress Notes (Signed)
Pre visit review using our clinic review tool, if applicable. No additional management support is needed unless otherwise documented below in the visit note. 

## 2016-12-07 NOTE — Progress Notes (Signed)
Antonio Newton is a 70 year old married male nonsmoker who comes in today for for general physical examination because of a history of hypertension, prostate cancer, reflux esophagitis, and mild ED  He takes Norvasc 10 mg daily along with Hydrocort thiazide 12.5 mg twice a day and Cozaar 100 mg daily. BP here today 140/80 he's not checking his blood pressure at home. He's having a lot of nocturia. We'll switch the diuretic to take 25 mg in the morning  He takes Neurontin from his neurologist 300 mg twice a day  He takes Prilosec 20 mg daily for chronic reflux. He uses Viagra from his urologist for erectile dysfunction. He has prostate cancer.  He had C3 C4 disc treated this past year. He's done well no complications. He uses over-the-counter Claritin and Flonase for allergic rhinitis.  He gets routine eye care, dental care, colonoscopy 2016 normal  He's back on an exercise program  Vaccinations up-to-date  Random blood sugar fasting 104 advised to stay in his sugar free diet. No family history of diabetes.  14 point review of systems is otherwise negative except for above  Vs BP 140/80 (BP Location: Left Arm, Patient Position: Sitting, Cuff Size: Normal)   Pulse 77   Temp 98.1 F (36.7 C) (Oral)   Ht 6\' 1"  (1.854 m)   Wt 187 lb 3.2 oz (84.9 kg)   BMI 24.70 kg/m  Examination HEENT was negative except for scar well-healed from previous cervical disc surgery  No carotid bruits cardiopulmonary exam normal Dahms exam normal. Genitalia done recently showed a very large right inguinal hernia. Expose to go see the general surgeon. Extremities DOS skin no peripheral pulses poor normal. Rectal and prostate done by urology therefore not repeated  Impression #1 hypertension........... continue current medication........ BP check 3 times weekly at home.... BP goal 135/85 or less  #2 prostate cancer............... continue follow-up by urology  #3 neuropathy..... Continue follow-up by neurology  and continue Neurontin 300 mg twice a day  Number for chronic reflux........... continue Prilosec  #5 ED follow-up by urology.Marland Kitchen

## 2016-12-07 NOTE — Patient Instructions (Signed)
Stop the Cozaar and the hydrochlorothiazide  Hyzaar 100-12 0.5..........Marland Kitchen 1 daily in the morning replaces the above to  Continue the Norvasc 10 mg daily  Omron pump up digital blood pressure cuff,,,,,,,,,, [employer]  Check your blood pressure daily in the morning Monday Wednesday Friday for 2 months........Marland Kitchen blood pressure goal 135/85 or less......... if not at goal return for reevaluation. If your blood pressure is at goal........ then going forward just check it once weekly  Walk 30 minutes daily  Avoid salt  Sugar free diet

## 2016-12-10 ENCOUNTER — Emergency Department (HOSPITAL_BASED_OUTPATIENT_CLINIC_OR_DEPARTMENT_OTHER): Payer: Medicare Other

## 2016-12-10 ENCOUNTER — Encounter (HOSPITAL_BASED_OUTPATIENT_CLINIC_OR_DEPARTMENT_OTHER): Payer: Self-pay | Admitting: *Deleted

## 2016-12-10 ENCOUNTER — Emergency Department (HOSPITAL_BASED_OUTPATIENT_CLINIC_OR_DEPARTMENT_OTHER)
Admission: EM | Admit: 2016-12-10 | Discharge: 2016-12-10 | Disposition: A | Discharge status: Home / Self Care | Payer: Medicare Other | Attending: Emergency Medicine | Admitting: Emergency Medicine

## 2016-12-10 DIAGNOSIS — Z8546 Personal history of malignant neoplasm of prostate: Secondary | ICD-10-CM | POA: Insufficient documentation

## 2016-12-10 DIAGNOSIS — K4031 Unilateral inguinal hernia, with obstruction, without gangrene, recurrent: Secondary | ICD-10-CM | POA: Diagnosis not present

## 2016-12-10 DIAGNOSIS — I10 Essential (primary) hypertension: Secondary | ICD-10-CM | POA: Insufficient documentation

## 2016-12-10 DIAGNOSIS — K5900 Constipation, unspecified: Secondary | ICD-10-CM | POA: Diagnosis not present

## 2016-12-10 DIAGNOSIS — Z79899 Other long term (current) drug therapy: Secondary | ICD-10-CM | POA: Diagnosis not present

## 2016-12-10 DIAGNOSIS — R1030 Lower abdominal pain, unspecified: Secondary | ICD-10-CM | POA: Diagnosis present

## 2016-12-10 DIAGNOSIS — Z7982 Long term (current) use of aspirin: Secondary | ICD-10-CM | POA: Insufficient documentation

## 2016-12-10 LAB — URINALYSIS, MICROSCOPIC (REFLEX): WBC, UA: NONE SEEN WBC/hpf (ref 0–5)

## 2016-12-10 LAB — BASIC METABOLIC PANEL
Anion gap: 9 (ref 5–15)
BUN: 25 mg/dL — ABNORMAL HIGH (ref 6–20)
CO2: 28 mmol/L (ref 22–32)
Calcium: 9.1 mg/dL (ref 8.9–10.3)
Chloride: 100 mmol/L — ABNORMAL LOW (ref 101–111)
Creatinine, Ser: 0.87 mg/dL (ref 0.61–1.24)
GFR calc Af Amer: >60 mL/min (ref >60)
GFR calc non Af Amer: >60 mL/min (ref >60)
Glucose, Bld: 115 mg/dL — ABNORMAL HIGH (ref 65–99)
Potassium: 3 mmol/L — ABNORMAL LOW (ref 3.5–5.1)
Sodium: 137 mmol/L (ref 135–145)

## 2016-12-10 LAB — URINALYSIS, ROUTINE W REFLEX MICROSCOPIC
Bilirubin Urine: NEGATIVE
Glucose, UA: NEGATIVE mg/dL
Hgb urine dipstick: NEGATIVE
Ketones, ur: NEGATIVE mg/dL
Leukocytes, UA: NEGATIVE
Nitrite: NEGATIVE
Protein, ur: 100 mg/dL — AB
Specific Gravity, Urine: 1.017 (ref 1.005–1.030)
pH: 7.5 (ref 5.0–8.0)

## 2016-12-10 LAB — CBC WITH DIFFERENTIAL/PLATELET
Basophils Absolute: 0 10*3/uL (ref 0.0–0.1)
Basophils Relative: 0 %
Eosinophils Absolute: 0.1 10*3/uL (ref 0.0–0.7)
Eosinophils Relative: 1 %
HCT: 39.6 % (ref 39.0–52.0)
Hemoglobin: 14.1 g/dL (ref 13.0–17.0)
Lymphocytes Relative: 9 %
Lymphs Abs: 0.7 10*3/uL (ref 0.7–4.0)
MCH: 30.1 pg (ref 26.0–34.0)
MCHC: 35.6 g/dL (ref 30.0–36.0)
MCV: 84.4 fL (ref 78.0–100.0)
Monocytes Absolute: 0.3 10*3/uL (ref 0.1–1.0)
Monocytes Relative: 5 %
Neutro Abs: 6.5 10*3/uL (ref 1.7–7.7)
Neutrophils Relative %: 85 %
Platelets: 171 10*3/uL (ref 150–400)
RBC: 4.69 MIL/uL (ref 4.22–5.81)
RDW: 11.8 % (ref 11.5–15.5)
WBC: 7.6 10*3/uL (ref 4.0–10.5)

## 2016-12-10 MED ORDER — IOPAMIDOL (ISOVUE-300) INJECTION 61%
100.0000 mL | Freq: Once | INTRAVENOUS | Status: AC | PRN
  Administered 2016-12-10: 100 mL via INTRAVENOUS

## 2016-12-10 MED ORDER — SODIUM CHLORIDE 0.9 % IV BOLUS (SEPSIS)
1000.0000 mL | Freq: Once | INTRAVENOUS | Status: AC
  Administered 2016-12-10: 1000 mL via INTRAVENOUS

## 2016-12-10 NOTE — Discharge Instructions (Signed)
Follow-up with surgery as previously recommended by your PCP. If you have recurrent symptoms, try to reduce (push it back in). If you cannot do this then come back immediately to the ER.

## 2016-12-10 NOTE — ED Triage Notes (Signed)
Constipated. Abdominal pain.

## 2016-12-10 NOTE — ED Provider Notes (Signed)
[place] DEPT [facility] Provider Note   CSN: [id number] Arrival date & time: 12/10/16  1652     History   Chief Complaint Chief Complaint  Patient presents with  . Abdominal Pain    HPI Antonio Newton is a 70 y.o. male.  HPI   70 year old male with abdominal pain. Onset last night. Pain is across lower abdomen. Doesn't lateralize. Crampy in nature. Occasionally gets waves of sharper pain. No appreciable exacerbating relieving factors. Has felt isolated and vomited once earlier today. No urinary complaints. No fevers or chills. No sick contacts. He feels like he may be constipated and tried to having a bowel movement but was unable to pass any stool. He states his last bowel movement was about a day and a half ago.  Past Medical History:  Diagnosis Date  . Allergy   . Anxiety   . Cancer [facility])    prostate  . ED (erectile dysfunction)   . Heart murmur    pt thinks he had a murmur many years ago.  Not sure now.maw  . History of hiatal hernia    inguninal  . Hypertension   . Neuromuscular disorder (HCC)    neuropathy - mildly in bil hands  . Neuropathy ([place])   . OA (osteoarthritis)    left hip  . Pneumonia     Patient Active Problem List   Diagnosis Date Noted  . S/P cervical spinal fusion 09/17/2016  . Prostate cancer ([place]) 01/14/2015  . Cough secondary to angiotensin converting enzyme inhibitor (ACE-I) 02/05/2014  . Low testosterone 12/06/2012  . ERECTILE DYSFUNCTION, MILD 01/17/2009  . Essential hypertension 01/17/2009  . OSTEOARTHRITIS 01/17/2009    Past Surgical History:  Procedure Laterality Date  . ANTERIOR CERVICAL DECOMP/DISCECTOMY FUSION N/A 02/06/2016   Procedure: Cevical three-four, Cervical four-five, Cervical five-six anterior cervical decompression with fusion interbody prosthesis plating and bonegraft;  Surgeon: Consuella Lose, MD;  Location: [place] [facility];  Service: Neurosurgery;  Laterality: N/A;  . COLONOSCOPY    . None    . postate  biopsy         Home Medications    Prior to Admission medications   Medication Sig Start Date End Date Taking? Authorizing Provider  amLODipine (NORVASC) 10 MG tablet Take 1 tablet (10 mg total) by mouth daily. 12/07/16   Dorena Cookey, MD  aspirin 81 MG tablet Take 81 mg by mouth daily.      Historical Provider, MD  Azelastine HCl 0.15 % SOLN Place 2 sprays into the nose 2 (two) times daily. Patient taking differently: Place 2 sprays into the nose 2 (two) times daily as needed (allergies).  11/18/15   Laurey Morale, MD  calcium-vitamin D 250-100 MG-UNIT per tablet Take 1 tablet by mouth daily.      Historical Provider, MD  gabapentin (NEURONTIN) 300 MG capsule Take 1 capsule (300 mg total) by mouth 2 (two) times daily. 11/09/16   Donika Keith Rake, DO  losartan (COZAAR) 100 MG tablet Take 1 tablet (100 mg total) by mouth daily. 10/28/16   Dorena Cookey, MD  losartan-hydrochlorothiazide (HYZAAR) 100-12.5 MG tablet Take 1 tablet by mouth daily. 12/07/16   Dorena Cookey, MD  multivitamin [facility]) per tablet Take 1 tablet by mouth 2 (two) times daily.     Historical Provider, MD  Omega-3 Fatty Acids (FISH OIL TRIPLE STRENGTH) 1400 MG CAPS Take 1,400 mg by mouth daily.    Historical Provider, MD  omeprazole (PRILOSEC) 20 MG capsule Take 1  capsule (20 mg total) by mouth daily. 12/07/16   Dorena Cookey, MD  sildenafil (VIAGRA) 100 MG tablet Take 1 tablet (100 mg total) by mouth daily as needed. 01/14/15   Dorena Cookey, MD  tiZANidine (ZANAFLEX) 2 MG tablet Take 1 tablet (2 mg total) by mouth at bedtime as needed for muscle spasms. 05/30/15   Alda Berthold, DO    Family History Family History  Problem Relation Age of Onset  . Hypertension Father     Deceased, 44  . Hypertension Mother     Deceased, 10  . Healthy Sister   . Esophageal cancer Paternal Grandfather   . Colon cancer Neg Hx   . Rectal cancer Neg Hx   . Stomach cancer Neg Hx     Social History Social History  Substance  Use Topics  . Smoking status: Never Smoker  . Smokeless tobacco: Never Used  . Alcohol use 0.0 oz/week     Comment: Occasionally (few times per month)     Allergies   Penicillins   Review of Systems Review of Systems   All systems reviewed and negative, other than as noted in HPI.   Physical Exam Updated Vital Signs BP 155/89   Pulse 85   Temp 98.1 F (36.7 C) (Oral)   Resp 16   Ht 6\' 1"  (1.854 m)   Wt 187 lb (84.8 kg)   SpO2 99%   BMI 24.67 kg/m   Physical Exam  Constitutional: He appears well-developed and well-nourished. No distress.  HENT:  Head: Normocephalic and atraumatic.  Eyes: Conjunctivae are normal. Right eye exhibits no discharge. Left eye exhibits no discharge.  Neck: Neck supple.  Cardiovascular: Normal rate, regular rhythm and normal heart sounds.  Exam reveals no gallop and no friction rub.   No murmur heard. Pulmonary/Chest: Effort normal and breath sounds normal. No respiratory distress.  Abdominal: Soft. He exhibits no distension. There is no tenderness.  Genitourinary:  Genitourinary Comments: Pretty large right inguinal hernia with contents extending into the scrotum. A loop of bowel has you could feel air and fluid moving with reduction of the hernia. Symptoms improved with reduction. Abdominal exam benign.  Musculoskeletal: He exhibits no edema or tenderness.  Neurological: He is alert.  Skin: Skin is warm and dry.  Psychiatric: He has a normal mood and affect. His behavior is normal. Thought content normal.  Nursing note and vitals reviewed.    ED Treatments / Results  Labs (all labs ordered are listed, but only abnormal results are displayed) Labs Reviewed  BASIC METABOLIC PANEL - Abnormal; Notable for the following:       Result Value   Potassium 3.0 (*)    Chloride 100 (*)    Glucose, Bld 115 (*)    BUN 25 (*)    All other components within normal limits  URINALYSIS, ROUTINE W REFLEX MICROSCOPIC - Abnormal; Notable for the  following:    Protein, ur 100 (*)    All other components within normal limits  URINALYSIS, MICROSCOPIC (REFLEX) - Abnormal; Notable for the following:    Bacteria, UA RARE (*)    Squamous Epithelial / LPF 0-5 (*)    All other components within normal limits  CBC WITH DIFFERENTIAL/PLATELET    EKG  EKG Interpretation None       Radiology Ct Abdomen Pelvis W Contrast  Result Date: 12/10/2016 CLINICAL DATA:  Lower abdominal pain and constipation. History of prostate cancer. EXAM: CT ABDOMEN AND PELVIS WITH CONTRAST TECHNIQUE:  Multidetector CT imaging of the abdomen and pelvis was performed using the standard protocol following bolus administration of intravenous contrast. CONTRAST:  151mL ISOVUE-300 IOPAMIDOL (ISOVUE-300) INJECTION 61% COMPARISON:  Lumbar spine radiographs dated 12/26/2007. FINDINGS: Lower chest: Clear lung bases.  No pleural fluid. Hepatobiliary: 2.5 cm left lobe liver cyst and 1.4 cm right lobe liver cyst. Normal appearing gallbladder. Pancreas: Unremarkable. No pancreatic ductal dilatation or surrounding inflammatory changes. Spleen: Normal in size without focal abnormality. Adrenals/Urinary Tract: Normal appearing adrenal glands. Small bilateral renal cysts. No urinary tract calculi or hydronephrosis. Enlarged prostate gland protruding into the base of the urinary bladder. Stomach/Bowel: Moderate-sized right inguinal hernia containing herniated distal ileum with mild diffuse wall thickening. The small bowel distal to the hernia consists of the terminal ileum leading into the right colon. The small bowel proximal to the hernia is dilated to the level of the mid ileum. The small bowel and stomach proximal to that level are not dilated. The distal ileal mesentery supplying the dilated bowel is edematous. There is some swirling of the mesentery in the mid and right lower abdomen. Normal caliber colon. Normal appearing appendix. Vascular/Lymphatic: No significant vascular findings  are present. No enlarged abdominal or pelvic lymph nodes. Reproductive: Moderately to markedly enlarged prostate gland with a prominent median lobe indenting into the base of the urinary bladder. Other: Large right hydrocele. Musculoskeletal: Lumbar and lower thoracic spine degenerative changes. IMPRESSION: 1. Moderately large right inguinal hernia with contrast and possibly incarcerated distal ileum with swirling of the mesentery and an associated closed loop obstruction with mesenteric edema. 2. Moderately to markedly enlarged prostate gland protruding into the base of the urinary bladder. 3. Large right hydrocele. Critical Value/emergent results were called by telephone at the time of interpretation on 12/10/2016 at 6:57 pm to Quincy Carnes, PA, who verbally acknowledged these results. Electronically Signed   By: Claudie Revering M.D.   On: 12/10/2016 19:02    Procedures Procedures (including critical care time)  Medications Ordered in ED Medications  sodium chloride 0.9 % bolus 1,000 mL (0 mLs Intravenous Stopped 12/10/16 1903)  iopamidol (ISOVUE-300) 61 % injection 100 mL (100 mLs Intravenous Contrast Given 12/10/16 1829)     Initial Impression / Assessment and Plan / ED Course  I have reviewed the triage vital signs and the nursing notes.  Pertinent labs & imaging results that were available during my care of the patient were reviewed by me and considered in my medical decision making (see chart for details).  Clinical Course     69yM with abdominal pain. Imaging as above. Hernia actually pretty easily reduced. Now "feels much better." He was recently seen by his PCPand was given general surgical follow-up information Discussed the need to follow-up as soon as he could. Return precautions were discussed. Demonstrated to patient how to try manually reducing his hernia if it became incarcerated again. He understands the need for immediate evaluation if he cannot reduce it. Other return  precautions were discussed. Outpatient surgical follow-up otherwise.  Final Clinical Impressions(s) / ED Diagnoses   Final diagnoses:  Recurrent unilateral inguinal hernia with obstruction and without gangrene    New Prescriptions New Prescriptions   No medications on file     Virgel Manifold, MD 12/14/16 1109

## 2016-12-10 NOTE — ED Notes (Signed)
ED Provider at bedside. 

## 2016-12-13 ENCOUNTER — Telehealth: Payer: Self-pay

## 2016-12-13 NOTE — Telephone Encounter (Signed)
Pt's wife called [facility] for pt having severe abdominal pain. Pt was directed to ED. Pt was seen and evaluated at ED. Pt was advised to follow up with surgical for hernia. Nothing further needed at this time.   Date/Time Eilene Ghazi Time): 12/10/2016 4:22:23 PM Patient Name: Antonio Newton Gender: Male DOB: 09-01-1946 Age: 70 Y 11 M 25 D Return Phone Number: [phone] (Primary), [phone] (Secondary) Address: City/State/Zip: [place] Client [place] [facility] - Client Client Site [place] - Day Physician Christie Nottingham - MD Contact Type Call Who Is Calling Patient / Member / Family / Caregiver Call Type Triage / Clinical Caller Name [place] Relationship To Patient Spouse Return Phone Number [phone] (Primary) Chief Complaint ABDOMINAL PAIN - Severe and only in abdomen Reason for Call Symptomatic / Request for Health Information Initial Comment Caller's husband is having abdominal pain. No bowel movement in 1 day. Appointment Disposition EMR Appointment Not Necessary Info pasted into Epic No PreDisposition Go to ED

## 2016-12-14 ENCOUNTER — Other Ambulatory Visit: Payer: Self-pay | Admitting: General Surgery

## 2016-12-14 DIAGNOSIS — K4030 Unilateral inguinal hernia, with obstruction, without gangrene, not specified as recurrent: Secondary | ICD-10-CM | POA: Diagnosis not present

## 2016-12-14 DIAGNOSIS — K219 Gastro-esophageal reflux disease without esophagitis: Secondary | ICD-10-CM | POA: Diagnosis not present

## 2016-12-14 DIAGNOSIS — Z9889 Other specified postprocedural states: Secondary | ICD-10-CM | POA: Diagnosis not present

## 2016-12-14 DIAGNOSIS — I10 Essential (primary) hypertension: Secondary | ICD-10-CM | POA: Diagnosis not present

## 2016-12-16 NOTE — Patient Instructions (Addendum)
Antonio Newton  12/16/2016   Your procedure is scheduled on: 12-23-16  Report to [facility] Main  Entrance take [facility]  elevators to 3rd floor to  [place] at   0830 AM.  Call this number if you have problems the morning of surgery [phone]   Remember: ONLY 1 PERSON MAY GO WITH YOU TO SHORT STAY TO GET  READY MORNING OF Antonio Newton.  Do not eat food or drink liquids :After Midnight.     Take these medicines the morning of surgery with A SIP OF WATER: Amlodipine. Omeprazole. Eye drops/ Nasal spray-usual. DO NOT TAKE ANY DIABETIC MEDICATIONS DAY OF YOUR SURGERY                               You may not have any metal on your body including hair pins and              piercings  Do not wear jewelry, make-up, lotions, powders or perfumes, deodorant             Do not wear nail polish.  Do not shave  48 hours prior to surgery.              Men may shave face and neck.   Do not bring valuables to the hospital. [place].  Contacts, dentures or bridgework may not be worn into surgery.  Leave suitcase in the car. After surgery it may be brought to your room.     Patients discharged the day of surgery will not be allowed to drive home.  Name and phone number of your driver:Antonio Newton [phone] cell  Special Instructions: N/A              Please read over the following fact sheets you were given: _____________________________________________________________________             [facility] - Preparing for Surgery Before surgery, you can play an important role.  Because skin is not sterile, your skin needs to be as free of germs as possible.  You can reduce the number of germs on your skin by washing with CHG (chlorahexidine gluconate) soap before surgery.  CHG is an antiseptic cleaner which kills germs and bonds with the skin to continue killing germs even after washing. Please DO NOT  use if you have an allergy to CHG or antibacterial soaps.  If your skin becomes reddened/irritated stop using the CHG and inform your nurse when you arrive at Short Stay. Do not shave (including legs and underarms) for at least 48 hours prior to the first CHG shower.  You may shave your face/neck. Please follow these instructions carefully:  1.  Shower with CHG Soap the night before surgery and the  morning of Surgery.  2.  If you choose to wash your hair, wash your hair first as usual with your  normal  shampoo.  3.  After you shampoo, rinse your hair and body thoroughly to remove the  shampoo.                           4.  Use CHG as you would any other liquid soap.  You can apply  chg directly  to the skin and wash                       Gently with a scrungie or clean washcloth.  5.  Apply the CHG Soap to your body ONLY FROM THE NECK DOWN.   Do not use on face/ open                           Wound or open sores. Avoid contact with eyes, ears mouth and genitals (private parts).                       Wash face,  Genitals (private parts) with your normal soap.             6.  Wash thoroughly, paying special attention to the area where your surgery  will be performed.  7.  Thoroughly rinse your body with warm water from the neck down.  8.  DO NOT shower/wash with your normal soap after using and rinsing off  the CHG Soap.                9.  Pat yourself dry with a clean towel.            10.  Wear clean pajamas.            11.  Place clean sheets on your bed the night of your first shower and do not  sleep with pets. Day of Surgery : Do not apply any lotions/deodorants the morning of surgery.  Please wear clean clothes to the hospital/surgery center.  FAILURE TO FOLLOW THESE INSTRUCTIONS MAY RESULT IN THE CANCELLATION OF YOUR SURGERY PATIENT SIGNATURE_________________________________  NURSE  SIGNATURE__________________________________  ________________________________________________________________________

## 2016-12-17 ENCOUNTER — Encounter (HOSPITAL_COMMUNITY)
Admission: RE | Admit: 2016-12-17 | Discharge: 2016-12-17 | Disposition: A | Discharge status: Home / Self Care | Payer: Medicare Other | Source: Ambulatory Visit | Attending: General Surgery | Admitting: General Surgery

## 2016-12-17 ENCOUNTER — Encounter (HOSPITAL_COMMUNITY): Payer: Self-pay

## 2016-12-17 DIAGNOSIS — M1990 Unspecified osteoarthritis, unspecified site: Secondary | ICD-10-CM | POA: Diagnosis not present

## 2016-12-17 DIAGNOSIS — K449 Diaphragmatic hernia without obstruction or gangrene: Secondary | ICD-10-CM | POA: Insufficient documentation

## 2016-12-17 DIAGNOSIS — G709 Myoneural disorder, unspecified: Secondary | ICD-10-CM | POA: Insufficient documentation

## 2016-12-17 DIAGNOSIS — Z8546 Personal history of malignant neoplasm of prostate: Secondary | ICD-10-CM | POA: Diagnosis not present

## 2016-12-17 DIAGNOSIS — F419 Anxiety disorder, unspecified: Secondary | ICD-10-CM | POA: Insufficient documentation

## 2016-12-17 DIAGNOSIS — R011 Cardiac murmur, unspecified: Secondary | ICD-10-CM | POA: Insufficient documentation

## 2016-12-17 DIAGNOSIS — N529 Male erectile dysfunction, unspecified: Secondary | ICD-10-CM | POA: Insufficient documentation

## 2016-12-17 DIAGNOSIS — Z01812 Encounter for preprocedural laboratory examination: Secondary | ICD-10-CM | POA: Insufficient documentation

## 2016-12-17 DIAGNOSIS — I10 Essential (primary) hypertension: Secondary | ICD-10-CM | POA: Diagnosis not present

## 2016-12-17 LAB — CBC WITH DIFFERENTIAL/PLATELET
BASOS ABS: 0 10*3/uL (ref 0.0–0.1)
Basophils Relative: 0 %
EOS PCT: 6 %
Eosinophils Absolute: 0.4 10*3/uL (ref 0.0–0.7)
HEMATOCRIT: 36.9 % — AB (ref 39.0–52.0)
Hemoglobin: 13.1 g/dL (ref 13.0–17.0)
LYMPHS ABS: 1.1 10*3/uL (ref 0.7–4.0)
LYMPHS PCT: 19 %
MCH: 30.3 pg (ref 26.0–34.0)
MCHC: 35.5 g/dL (ref 30.0–36.0)
MCV: 85.4 fL (ref 78.0–100.0)
MONO ABS: 0.4 10*3/uL (ref 0.1–1.0)
MONOS PCT: 6 %
NEUTROS ABS: 3.9 10*3/uL (ref 1.7–7.7)
Neutrophils Relative %: 69 %
PLATELETS: 163 10*3/uL (ref 150–400)
RBC: 4.32 MIL/uL (ref 4.22–5.81)
RDW: 12.2 % (ref 11.5–15.5)
WBC: 5.7 10*3/uL (ref 4.0–10.5)

## 2016-12-17 LAB — COMPREHENSIVE METABOLIC PANEL
ALT: 21 U/L (ref 17–63)
AST: 22 U/L (ref 15–41)
Albumin: 4.1 g/dL (ref 3.5–5.0)
Alkaline Phosphatase: 52 U/L (ref 38–126)
Anion gap: 10 (ref 5–15)
BILIRUBIN TOTAL: 0.9 mg/dL (ref 0.3–1.2)
BUN: 26 mg/dL — AB (ref 6–20)
CO2: 30 mmol/L (ref 22–32)
CREATININE: 0.99 mg/dL (ref 0.61–1.24)
Calcium: 9 mg/dL (ref 8.9–10.3)
Chloride: 100 mmol/L — ABNORMAL LOW (ref 101–111)
Glucose, Bld: 109 mg/dL — ABNORMAL HIGH (ref 65–99)
POTASSIUM: 3.5 mmol/L (ref 3.5–5.1)
Sodium: 140 mmol/L (ref 135–145)
TOTAL PROTEIN: 7.1 g/dL (ref 6.5–8.1)

## 2016-12-17 NOTE — Pre-Procedure Instructions (Signed)
EKG 12-07-16 Epic. Labs CBC,BMP Epic 12-10-16. CT abd/pelvis 12-10-16 Epic.

## 2016-12-19 NOTE — H&P (Signed)
Willette Cluster Location: [facility] Patient #: [id number] DOB: 02-Oct-1946 Married / Language: English / Race: Black or African American Male        History of Present Illness    This is a 70 year old gentleman who is here with his wife to discuss surgical management of a right inguinal hernia. He is referred by Dr. Stevie Kern who is his PCP. Dr. Risa Grill is his urologist. He has seen [facility] in the past for routine screening. He has no prior history of hernia surgery. He has known that he had a bulge for one to 2 years. This bothers him a little bit but not enough to be worrisome or interfere with his activities. On December 10, 2016 he developed moderately severe lower abdominal pain and constipation and nausea. Did not vomit. He went to the emergency room where a CT scan showed a large right inguinal hernia containing ileum and mesenteric swirling and a hydrocele. BPH also was noted The emergency room doctor reduced the hernia and he became asymptomatic and has been fine since. He knows he has a large hernia and wants this repaired.        Past history reveals colonoscopy and upper endoscopy within the last year or 2 with unremarkable findings. Cervical disc and fusion by Dr. Lillia Corporal. Low-grade prostate cancer followed by Dr. Jeralyn Ruths. Essential hypertension. Chronic GERD somewhat dependent on proton pump inhibitors but asymptomatic when he takes his medicine. Some lingering neuropathy from his cervical disc problems. Social history reveals that he is married. His wife is with him. She recently had total knee replacement and is using a cane. Never smoked.      Family history reveals mother and father deceased. Both had hypertension.      He will be scheduled for open repair of right inguinal hernia with mesh. We will expedite this because of the recent episode of incarceration, discomfort, and the fact that he is having to manually reduce  this more frequently now. I discussed the indications, details, techniques, and numerous risk of the surgery with him. I do not think he is a good candidate for laparoscopic repair because the hernia is so large. We will set aside extra time for the surgery because of this. He is aware of the risks of bleeding, infection, recurrence of the hernia, nerve damage with chronic pain, urinary retention, injury to adjacent organs such as the testicle or bladder or intestine was major reconstructive surgery, although very uncommon. Cardiac pulmonary and thromboembolic problems. He understands all of these issues. All his questions are answered. He agrees with this plan.   Past Surgical History Spinal Surgery - Neck   Diagnostic Studies History  Colonoscopy  within last year  Allergies  Penicillin G Potassium *PENICILLINS*   Medication History  Omeprazole (20MG  Capsule DR, Oral) Active. Losartan Potassium-HCTZ (100-12.5MG  Tablet, Oral) Active. AmLODIPine Besylate (10MG  Tablet, Oral) Active. Gabapentin (300MG  Capsule, Oral) Active. Motrin (400MG  Tablet, Oral two times daily, as needed) Active. Aspirin EC (81MG  Tablet DR, Oral) Active. Claritin (10MG  Capsule, Oral every other day) Active. Fish Oil (500MG  Capsule, Oral) Active. Calcium (500MG  Tablet, Oral) Active. Multi Vitamin Mens (Oral) Active. Medications Reconciled  Social History  Alcohol use  Occasional alcohol use. Caffeine use  Carbonated beverages, Coffee, Tea.  Family History Alcohol Abuse  Father. Cancer  Mother. Hypertension  Father, Mother. Prostate Cancer  Father.  Other Problems Anxiety Disorder  Arthritis  Back Pain  Gastric Ulcer  High blood pressure  Inguinal Hernia  Prostate Cancer     Review of Systems General Present- Chills. Not Present- Appetite Loss, Fatigue, Fever, Night Sweats, Weight Gain and Weight Loss. Skin Not Present- Change in Wart/Mole, Dryness, Hives,  Jaundice, New Lesions, Non-Healing Wounds, Rash and Ulcer. HEENT Present- Seasonal Allergies, Sinus Pain and Wears glasses/contact lenses. Not Present- Earache, Hearing Loss, Hoarseness, Nose Bleed, Oral Ulcers, Ringing in the Ears, Sore Throat, Visual Disturbances and Yellow Eyes. Respiratory Present- Snoring. Not Present- Bloody sputum, Chronic Cough, Difficulty Breathing and Wheezing. Breast Not Present- Breast Mass, Breast Pain, Nipple Discharge and Skin Changes. Cardiovascular Present- Rapid Heart Rate. Not Present- Chest Pain, Difficulty Breathing Lying Down, Leg Cramps, Palpitations, Shortness of Breath and Swelling of Extremities. Gastrointestinal Present- Bloating and Indigestion. Not Present- Abdominal Pain, Bloody Stool, Change in Bowel Habits, Chronic diarrhea, Constipation, Difficulty Swallowing, Excessive gas, Gets full quickly at meals, Hemorrhoids, Nausea, Rectal Pain and Vomiting. Male Genitourinary Present- Impotence and Urine Leakage. Not Present- Blood in Urine, Change in Urinary Stream, Frequency, Nocturia, Painful Urination and Urgency. Musculoskeletal Present- Back Pain and Joint Stiffness. Not Present- Joint Pain, Muscle Pain, Muscle Weakness and Swelling of Extremities. Neurological Present- Numbness. Not Present- Decreased Memory, Fainting, Headaches, Seizures, Tingling, Tremor, Trouble walking and Weakness. Psychiatric Not Present- Anxiety, Bipolar, Change in Sleep Pattern, Depression, Fearful and Frequent crying. Endocrine Not Present- Cold Intolerance, Excessive Hunger, Hair Changes, Heat Intolerance, Hot flashes and New Diabetes. Hematology Not Present- Blood Thinners, Easy Bruising, Excessive bleeding, Gland problems, HIV and Persistent Infections.  Vitals  Weight: 186 lb Height: 71in Body Surface Area: 2.04 m Body Mass Index: 25.94 kg/m  Pulse: 82 (Regular)  BP: 144/76 (Sitting, Left Arm, Standard)    Physical Exam General Mental  Status-Alert. General Appearance-Consistent with stated age. Hydration-Well hydrated. Voice-Normal.  Head and Neck Head-normocephalic, atraumatic with no lesions or palpable masses. Trachea-midline. Thyroid Gland Characteristics - normal size and consistency.  Eye Eyeball - Bilateral-Extraocular movements intact. Sclera/Conjunctiva - Bilateral-No scleral icterus.  Chest and Lung Exam Chest and lung exam reveals -quiet, even and easy respiratory effort with no use of accessory muscles and on auscultation, normal breath sounds, no adventitious sounds and normal vocal resonance. Inspection Chest Wall - Normal. Back - normal.  Cardiovascular Cardiovascular examination reveals -normal heart sounds, regular rate and rhythm with no murmurs and normal pedal pulses bilaterally.  Abdomen Inspection Inspection of the abdomen reveals - No Hernias. Skin - Scar - no surgical scars. Palpation/Percussion Palpation and Percussion of the abdomen reveal - Soft, Non Tender, No Rebound tenderness, No Rigidity (guarding) and No hepatosplenomegaly. Auscultation Auscultation of the abdomen reveals - Bowel sounds normal.  Male Genitourinary Note: Very large right inguinal hernia. Extends down and fills the right scrotum. No evidence of hernia on the left. Skin is healthy. I am able to slowly reduce this when he is supine. A little bit uncomfortable but he could tolerate this. Tissues are soft thereafter. No edema.   Neurologic Neurologic evaluation reveals -alert and oriented x 3 with no impairment of recent or remote memory. Mental Status-Normal.  Musculoskeletal Normal Exam - Left-Upper Extremity Strength Normal and Lower Extremity Strength Normal. Normal Exam - Right-Upper Extremity Strength Normal and Lower Extremity Strength Normal.  Lymphatic Head & Neck  General Head & Neck Lymphatics: Bilateral - Description - Normal. Axillary  General Axillary Region:  Bilateral - Description - Normal. Tenderness - Non Tender. Femoral & Inguinal  Generalized Femoral & Inguinal Lymphatics: Bilateral - Description - Normal. Tenderness - Non Tender.    Assessment &  Plan  INGUINAL HERNIA OF RIGHT SIDE WITH OBSTRUCTION (K40.30)   You have a large right inguinal hernia. Most likely this is called an indirect right inguinal hernia It is fairly large You recently had an episode of incarceration, but fortunately the emergency room doctor was able to push this back in and nothing bad has happened You are at risk for this happening again I recommended that you have surgery to repair the hernia, and you have agreed  You will be scheduled for open repair of right inguinal hernia with mesh, as described We have discussed the indications, techniques, and risks of the surgery in detail Please read the patient information booklet again that I gave you We will try to expedite the repair  HISTORY OF PROSTATE CANCER (Z85.46) CHRONIC GERD (K21.9) HYPERTENSION, ESSENTIAL (I10) HISTORY OF CERVICAL SPINAL SURGERY (Z98.890) NEUROPATHY (G62.9)    Jerrianne Hartin M. Dalbert Batman, M.D., [facility] General and Minimally invasive Surgery Breast and Colorectal Surgery Office:   [phone] Pager:   [phone]

## 2016-12-23 ENCOUNTER — Ambulatory Visit (HOSPITAL_COMMUNITY): Payer: Medicare Other | Admitting: Anesthesiology

## 2016-12-23 ENCOUNTER — Encounter (HOSPITAL_COMMUNITY)
Admission: RE | Disposition: A | Discharge status: Home / Self Care | Payer: Self-pay | Source: Ambulatory Visit | Attending: General Surgery

## 2016-12-23 ENCOUNTER — Ambulatory Visit (HOSPITAL_COMMUNITY)
Admission: RE | Admit: 2016-12-23 | Discharge: 2016-12-23 | Disposition: A | Discharge status: Home / Self Care | Payer: Medicare Other | Source: Ambulatory Visit | Attending: General Surgery | Admitting: General Surgery

## 2016-12-23 ENCOUNTER — Encounter (HOSPITAL_COMMUNITY): Payer: Self-pay | Admitting: *Deleted

## 2016-12-23 DIAGNOSIS — G629 Polyneuropathy, unspecified: Secondary | ICD-10-CM | POA: Insufficient documentation

## 2016-12-23 DIAGNOSIS — C61 Malignant neoplasm of prostate: Secondary | ICD-10-CM | POA: Insufficient documentation

## 2016-12-23 DIAGNOSIS — Z96659 Presence of unspecified artificial knee joint: Secondary | ICD-10-CM | POA: Diagnosis not present

## 2016-12-23 DIAGNOSIS — G8918 Other acute postprocedural pain: Secondary | ICD-10-CM | POA: Diagnosis not present

## 2016-12-23 DIAGNOSIS — K4030 Unilateral inguinal hernia, with obstruction, without gangrene, not specified as recurrent: Secondary | ICD-10-CM | POA: Diagnosis not present

## 2016-12-23 DIAGNOSIS — Z7982 Long term (current) use of aspirin: Secondary | ICD-10-CM | POA: Diagnosis not present

## 2016-12-23 DIAGNOSIS — K219 Gastro-esophageal reflux disease without esophagitis: Secondary | ICD-10-CM | POA: Insufficient documentation

## 2016-12-23 DIAGNOSIS — K4090 Unilateral inguinal hernia, without obstruction or gangrene, not specified as recurrent: Secondary | ICD-10-CM | POA: Diagnosis not present

## 2016-12-23 DIAGNOSIS — Z79899 Other long term (current) drug therapy: Secondary | ICD-10-CM | POA: Diagnosis not present

## 2016-12-23 DIAGNOSIS — I10 Essential (primary) hypertension: Secondary | ICD-10-CM | POA: Insufficient documentation

## 2016-12-23 HISTORY — PX: INSERTION OF MESH: SHX5868

## 2016-12-23 HISTORY — PX: INGUINAL HERNIA REPAIR: SHX194

## 2016-12-23 HISTORY — DX: Unilateral inguinal hernia, with obstruction, without gangrene, not specified as recurrent: K40.30

## 2016-12-23 SURGERY — REPAIR, HERNIA, INGUINAL, ADULT
Anesthesia: General | Laterality: Right

## 2016-12-23 MED ORDER — SUGAMMADEX SODIUM 200 MG/2ML IV SOLN
INTRAVENOUS | Status: AC
  Filled 2016-12-23: qty 2

## 2016-12-23 MED ORDER — FENTANYL CITRATE (PF) 100 MCG/2ML IJ SOLN
INTRAMUSCULAR | Status: AC
  Filled 2016-12-23: qty 2

## 2016-12-23 MED ORDER — LACTATED RINGERS IV SOLN
INTRAVENOUS | Status: DC | PRN
  Administered 2016-12-23 (×2): via INTRAVENOUS

## 2016-12-23 MED ORDER — OXYCODONE HCL 5 MG PO TABS
5.0000 mg | ORAL_TABLET | ORAL | Status: DC | PRN
  Administered 2016-12-23: 5 mg via ORAL
  Filled 2016-12-23: qty 1

## 2016-12-23 MED ORDER — SUGAMMADEX SODIUM 200 MG/2ML IV SOLN
INTRAVENOUS | Status: DC | PRN
  Administered 2016-12-23: 200 mg via INTRAVENOUS

## 2016-12-23 MED ORDER — DEXAMETHASONE SODIUM PHOSPHATE 10 MG/ML IJ SOLN
INTRAMUSCULAR | Status: AC
Start: 2016-12-23 — End: 2016-12-23
  Filled 2016-12-23: qty 1

## 2016-12-23 MED ORDER — ROPIVACAINE HCL 5 MG/ML IJ SOLN
INTRAMUSCULAR | Status: AC
  Filled 2016-12-23: qty 30

## 2016-12-23 MED ORDER — DEXAMETHASONE SODIUM PHOSPHATE 10 MG/ML IJ SOLN
INTRAMUSCULAR | Status: DC | PRN
  Administered 2016-12-23: 10 mg via INTRAVENOUS

## 2016-12-23 MED ORDER — EPINEPHRINE PF 1 MG/ML IJ SOLN
INTRAMUSCULAR | Status: AC
  Filled 2016-12-23: qty 1

## 2016-12-23 MED ORDER — ONDANSETRON HCL 4 MG/2ML IJ SOLN
INTRAMUSCULAR | Status: AC
  Filled 2016-12-23: qty 2

## 2016-12-23 MED ORDER — BUPIVACAINE HCL (PF) 0.5 % IJ SOLN
INTRAMUSCULAR | Status: AC
  Filled 2016-12-23: qty 30

## 2016-12-23 MED ORDER — PROPOFOL 10 MG/ML IV BOLUS
INTRAVENOUS | Status: AC
  Filled 2016-12-23: qty 20

## 2016-12-23 MED ORDER — PHENYLEPHRINE HCL 10 MG/ML IJ SOLN
INTRAMUSCULAR | Status: DC | PRN
  Administered 2016-12-23: 40 ug via INTRAVENOUS
  Administered 2016-12-23 (×2): 80 ug via INTRAVENOUS

## 2016-12-23 MED ORDER — CHLORHEXIDINE GLUCONATE CLOTH 2 % EX PADS: 6.0000 | MEDICATED_PAD | Freq: Once | CUTANEOUS | Status: DC

## 2016-12-23 MED ORDER — CEFAZOLIN SODIUM-DEXTROSE 2-4 GM/100ML-% IV SOLN
INTRAVENOUS | Status: AC
  Filled 2016-12-23: qty 100

## 2016-12-23 MED ORDER — BUPIVACAINE-EPINEPHRINE 0.5% -1:200000 IJ SOLN
INTRAMUSCULAR | Status: DC | PRN
  Administered 2016-12-23: 10 mL

## 2016-12-23 MED ORDER — ACETAMINOPHEN 325 MG PO TABS: 650.0000 mg | ORAL_TABLET | ORAL | Status: DC | PRN

## 2016-12-23 MED ORDER — FENTANYL CITRATE (PF) 100 MCG/2ML IJ SOLN
INTRAMUSCULAR | Status: DC | PRN
  Administered 2016-12-23 (×3): 50 ug via INTRAVENOUS

## 2016-12-23 MED ORDER — ROPIVACAINE HCL 5 MG/ML IJ SOLN
INTRAMUSCULAR | Status: DC | PRN
  Administered 2016-12-23: 30 mL

## 2016-12-23 MED ORDER — 0.9 % SODIUM CHLORIDE (POUR BTL) OPTIME
TOPICAL | Status: DC | PRN
Start: 2016-12-23 — End: 2016-12-23
  Administered 2016-12-23: 1000 mL

## 2016-12-23 MED ORDER — SODIUM CHLORIDE 0.9% FLUSH: 3.0000 mL | Freq: Two times a day (BID) | INTRAVENOUS | Status: DC

## 2016-12-23 MED ORDER — PROPOFOL 10 MG/ML IV BOLUS
INTRAVENOUS | Status: DC | PRN
  Administered 2016-12-23: 160 mg via INTRAVENOUS
  Administered 2016-12-23: 40 mg via INTRAVENOUS

## 2016-12-23 MED ORDER — CEFAZOLIN SODIUM-DEXTROSE 2-4 GM/100ML-% IV SOLN
2.0000 g | INTRAVENOUS | Status: AC
  Administered 2016-12-23: 2 g via INTRAVENOUS

## 2016-12-23 MED ORDER — ROCURONIUM BROMIDE 50 MG/5ML IV SOSY
PREFILLED_SYRINGE | INTRAVENOUS | Status: AC
  Filled 2016-12-23: qty 10

## 2016-12-23 MED ORDER — LIDOCAINE HCL (CARDIAC) 20 MG/ML IV SOLN
INTRAVENOUS | Status: DC | PRN
  Administered 2016-12-23: 60 mg via INTRAVENOUS

## 2016-12-23 MED ORDER — LACTATED RINGERS IV SOLN: INTRAVENOUS | Status: DC

## 2016-12-23 MED ORDER — ROCURONIUM BROMIDE 100 MG/10ML IV SOLN
INTRAVENOUS | Status: DC | PRN
  Administered 2016-12-23 (×2): 10 mg via INTRAVENOUS
  Administered 2016-12-23: 30 mg via INTRAVENOUS
  Administered 2016-12-23: 10 mg via INTRAVENOUS

## 2016-12-23 MED ORDER — ACETAMINOPHEN 650 MG RE SUPP
650.0000 mg | RECTAL | Status: DC | PRN
  Filled 2016-12-23: qty 1

## 2016-12-23 MED ORDER — HYDROCODONE-ACETAMINOPHEN 5-325 MG PO TABS: 1.0000 | ORAL_TABLET | ORAL | 0 refills | Status: DC | PRN

## 2016-12-23 MED ORDER — SODIUM CHLORIDE 0.9 % IV SOLN: 250.0000 mL | INTRAVENOUS | Status: DC | PRN

## 2016-12-23 MED ORDER — ONDANSETRON HCL 4 MG/2ML IJ SOLN
INTRAMUSCULAR | Status: DC | PRN
  Administered 2016-12-23: 4 mg via INTRAVENOUS

## 2016-12-23 MED ORDER — SODIUM CHLORIDE 0.9% FLUSH: 3.0000 mL | INTRAVENOUS | Status: DC | PRN

## 2016-12-23 MED ORDER — FENTANYL CITRATE (PF) 100 MCG/2ML IJ SOLN: 25.0000 ug | INTRAMUSCULAR | Status: DC | PRN

## 2016-12-23 SURGICAL SUPPLY — 49 items
ADH SKN CLS APL DERMABOND .7 (GAUZE/BANDAGES/DRESSINGS) ×1
APL SKNCLS STERI-STRIP NONHPOA (GAUZE/BANDAGES/DRESSINGS)
BENZOIN TINCTURE PRP APPL 2/3 (GAUZE/BANDAGES/DRESSINGS) IMPLANT
BLADE HEX COATED 2.75 (ELECTRODE) ×3 IMPLANT
BLADE SURG 15 STRL LF DISP TIS (BLADE) ×1 IMPLANT
BLADE SURG 15 STRL SS (BLADE) ×3
CHLORAPREP W/TINT 26ML (MISCELLANEOUS) ×3 IMPLANT
CLOSURE WOUND 1/2 X4 (GAUZE/BANDAGES/DRESSINGS)
COVER SURGICAL LIGHT HANDLE (MISCELLANEOUS) ×3 IMPLANT
DECANTER SPIKE VIAL GLASS SM (MISCELLANEOUS) ×3 IMPLANT
DERMABOND ADVANCED (GAUZE/BANDAGES/DRESSINGS) ×2
DERMABOND ADVANCED .7 DNX12 (GAUZE/BANDAGES/DRESSINGS) IMPLANT
DISSECTOR ROUND CHERRY 3/8 STR (MISCELLANEOUS) IMPLANT
DRAIN PENROSE 18X1/2 LTX STRL (DRAIN) ×2 IMPLANT
DRAPE LAPAROTOMY TRNSV 102X78 (DRAPE) ×3 IMPLANT
ELECT PENCIL ROCKER SW 15FT (MISCELLANEOUS) ×3 IMPLANT
ELECT REM PT RETURN 9FT ADLT (ELECTROSURGICAL) ×3
ELECTRODE REM PT RTRN 9FT ADLT (ELECTROSURGICAL) ×1 IMPLANT
GAUZE SPONGE 4X4 12PLY STRL (GAUZE/BANDAGES/DRESSINGS) IMPLANT
GLOVE EUDERMIC 7 POWDERFREE (GLOVE) ×3 IMPLANT
GOWN STRL REUS W/TWL XL LVL3 (GOWN DISPOSABLE) ×6 IMPLANT
KIT BASIN OR (CUSTOM PROCEDURE TRAY) ×3 IMPLANT
MESH ULTRAPRO 3X6 7.6X15CM (Mesh General) ×2 IMPLANT
NDL HYPO 25X1 1.5 SAFETY (NEEDLE) ×1 IMPLANT
NEEDLE HYPO 25X1 1.5 SAFETY (NEEDLE) ×3 IMPLANT
PACK BASIC VI WITH GOWN DISP (CUSTOM PROCEDURE TRAY) ×3 IMPLANT
SPONGE LAP 4X18 X RAY DECT (DISPOSABLE) ×3 IMPLANT
STAPLER VISISTAT 35W (STAPLE) IMPLANT
STRIP CLOSURE SKIN 1/2X4 (GAUZE/BANDAGES/DRESSINGS) IMPLANT
SUT MNCRL AB 4-0 PS2 18 (SUTURE) ×3 IMPLANT
SUT NOVA NAB GS-21 0 18 T12 DT (SUTURE) IMPLANT
SUT PROLENE 2 0 CT2 30 (SUTURE) ×10 IMPLANT
SUT SILK 2 0 (SUTURE) ×3
SUT SILK 2 0 SH (SUTURE) IMPLANT
SUT SILK 2-0 18XBRD TIE 12 (SUTURE) IMPLANT
SUT VIC AB 2-0 SH 27 (SUTURE) ×6
SUT VIC AB 2-0 SH 27X BRD (SUTURE) ×1 IMPLANT
SUT VIC AB 3-0 54XBRD REEL (SUTURE) ×1 IMPLANT
SUT VIC AB 3-0 BRD 54 (SUTURE) ×3
SUT VIC AB 3-0 SH 27 (SUTURE) ×3
SUT VIC AB 3-0 SH 27XBRD (SUTURE) ×1 IMPLANT
SUT VICRYL 2 0 18  UND BR (SUTURE)
SUT VICRYL 2 0 18 UND BR (SUTURE) IMPLANT
SYR 20CC LL (SYRINGE) ×3 IMPLANT
SYR BULB IRRIGATION 50ML (SYRINGE) ×3 IMPLANT
TOWEL OR 17X26 10 PK STRL BLUE (TOWEL DISPOSABLE) ×3 IMPLANT
TOWEL OR NON WOVEN STRL DISP B (DISPOSABLE) ×3 IMPLANT
TRAY FOLEY W/METER SILVER 16FR (SET/KITS/TRAYS/PACK) IMPLANT
YANKAUER SUCT BULB TIP 10FT TU (MISCELLANEOUS) IMPLANT

## 2016-12-23 NOTE — Discharge Instructions (Signed)
[facility] _______[facility] ° °UMBILICAL OR INGUINAL HERNIA REPAIR: POST OP INSTRUCTIONS ° °Always review your discharge instruction sheet given to you by the facility where your surgery was performed. °IF YOU HAVE DISABILITY OR FAMILY LEAVE FORMS, YOU MUST BRING THEM TO THE OFFICE FOR PROCESSING.   °DO NOT GIVE THEM TO YOUR DOCTOR. ° °1. A  prescription for pain medication may be given to you upon discharge.  Take your pain medication as prescribed, if needed.  If narcotic pain medicine is not needed, then you may take acetaminophen (Tylenol) or ibuprofen (Advil) as needed. °2. Take your usually prescribed medications unless otherwise directed. °If you need a refill on your pain medication, please contact your pharmacy.  They will contact our office to request authorization. Prescriptions will not be filled after 5 pm or on week-ends. °3. You should follow a light diet the first 24 hours after arrival home, such as soup and crackers, etc.  Be sure to include lots of fluids daily.  Resume your normal diet the day after surgery. °4.Most patients will experience some swelling and bruising around the umbilicus or in the groin and scrotum.  Ice packs and reclining will help.  Swelling and bruising can take several days to resolve.  °6. It is common to experience some constipation if taking pain medication after surgery.  Increasing fluid intake and taking a stool softener (such as Colace) will usually help or prevent this problem from occurring.  A mild laxative (Milk of Magnesia or Miralax) should be taken according to package directions if there are no bowel movements after 48 hours. °7. Unless discharge instructions indicate otherwise, you may remove your bandages 24-48 hours after surgery, and you may shower at that time.  You may have steri-strips (small skin tapes) in place directly over the incision.  These strips should be left on the skin for 7-10 days.  If your surgeon used skin glue on the  incision, you may shower in 24 hours.  The glue will flake off over the next 2-3 weeks.  Any sutures or staples will be removed at the office during your follow-up visit. °8. ACTIVITIES:  You may resume regular (light) daily activities beginning the next day--such as daily self-care, walking, climbing stairs--gradually increasing activities as tolerated.  You may have sexual intercourse when it is comfortable.  Refrain from any heavy lifting or straining until approved by your doctor. ° °a.You may drive when you are no longer taking prescription pain medication, you can comfortably wear a seatbelt, and you can safely maneuver your car and apply brakes. °b.RETURN TO WORK:   °_____________________________________________ ° °9.You should see your doctor in the office for a follow-up appointment approximately 2-3 weeks after your surgery.  Make sure that you call for this appointment within a day or two after you arrive home to insure a convenient appointment time. °10.OTHER INSTRUCTIONS: _________________________ °   _____________________________________ ° °WHEN TO CALL YOUR DOCTOR: °1. Fever over 101.0 °2. Inability to urinate °3. Nausea and/or vomiting °4. Extreme swelling or bruising °5. Continued bleeding from incision. °6. Increased pain, redness, or drainage from the incision ° °The clinic staff is available to answer your questions during regular business hours.  Please don’t hesitate to call and ask to speak to one of the nurses for clinical concerns.  If you have a medical emergency, go to the nearest emergency room or call 911.  A surgeon from [facility] is always on call at the hospital ° ° °  [address], [address], [place], [place]  [address] ? ° [address] [address], [place], [place]   [address] °[phone] ? [phone] ? FAX [phone] °Web site: [email or web address] °

## 2016-12-23 NOTE — Anesthesia Preprocedure Evaluation (Addendum)
Anesthesia Evaluation  Patient identified by MRN, date of birth, ID band Patient awake    Reviewed: Allergy & Precautions, H&P , Patient's Chart, lab work & pertinent test results, reviewed documented beta blocker date and time   Airway Mallampati: II  TM Distance: >3 FB Neck ROM: full    Dental no notable dental hx.    Pulmonary    Pulmonary exam normal breath sounds clear to auscultation       Cardiovascular hypertension, On Medications  Rhythm:regular Rate:Normal     Neuro/Psych    GI/Hepatic   Endo/Other    Renal/GU      Musculoskeletal   Abdominal   Peds  Hematology   Anesthesia Other Findings   Reproductive/Obstetrics                             Anesthesia Physical Anesthesia Plan  ASA: II  Anesthesia Plan: General   Post-op Pain Management:    Induction: Intravenous  Airway Management Planned: Oral ETT  Additional Equipment:   Intra-op Plan:   Post-operative Plan: Extubation in OR  Informed Consent: I have reviewed the patients History and Physical, chart, labs and discussed the procedure including the risks, benefits and alternatives for the proposed anesthesia with the patient or authorized representative who has indicated his/her understanding and acceptance.   Dental Advisory Given  Plan Discussed with: CRNA and Surgeon  Anesthesia Plan Comments: (  Discussed general anesthesia, including possible nausea, instrumentation of airway, sore throat,pulmonary aspiration, etc. I asked if the were any outstanding questions, or  concerns before we proceeded.)        Anesthesia Quick Evaluation

## 2016-12-23 NOTE — Interval H&P Note (Signed)
History and Physical Interval Note:  12/23/2016 8:57 AM  Nima Glade Nurse  has presented today for surgery, with the diagnosis of Right inguinal henia  The various methods of treatment have been discussed with the patient and family. After consideration of risks, benefits and other options for treatment, the patient has consented to  Procedure(s): OPEN REPAIR RIGHT INGUINAL HERNIA WITH MESH (Right) INSERTION OF MESH (Right) as a surgical intervention .  The patient's history has been reviewed, patient examined, no change in status, stable for surgery.  I have reviewed the patient's chart and labs.  Questions were answered to the patient's satisfaction.     Adin Hector

## 2016-12-23 NOTE — Op Note (Signed)
Patient Name:           Antonio Newton   Date of Surgery:        12/23/2016  Pre op Diagnosis:      Incarcerated, indirect right inguinal hernia  Post op Diagnosis:    Same  Procedure:                 Open, Lichtenstein repair incarcerated right inguinal hernia with mesh  Surgeon:                     Edsel Petrin. Dalbert Batman, M.D., FACS  Assistant:                      OR staff   Indication for Assistant: N/A  Operative Indications:  This is a 70 year old gentleman who is here with his wife to discuss surgical management of a right inguinal hernia. He is referred by Dr. Stevie Kern who is his PCP. Dr. Risa Grill is his urologist. He has seen  GI in the past for routine screening. He has no prior history of hernia surgery. He has known that he had a bulge for one to 2 years. This bothers him a little bit but not enough to be worrisome or interfere with his activities. On December 10, 2016 he developed moderately severe lower abdominal pain and constipation and nausea. Did not vomit. He went to the emergency room where a CT scan showed a large right inguinal hernia containing ileum and mesenteric swirling and a hydrocele. BPH also was noted The emergency room doctor reduced the hernia and he became asymptomatic and has been fine since. He knows he has a large hernia and wants this repaired.        Past history reveals colonoscopy and upper endoscopy within the last year or 2 with unremarkable findings. Cervical disc and fusion by Dr. Merdis Delay. Low-grade prostate cancer followed by Dr. Risa Grill. Essential hypertension. Chronic GERD somewhat dependent on proton pump inhibitors but asymptomatic when he takes his medicine. Some lingering neuropathy from his cervical disc problems. Social history reveals that he is married. His wife is with him. She recently had total knee replacement and is using a cane. Never smoked.      He will be scheduled for open repair of right inguinal  hernia with mesh. We will expedite this because of the recent episode of incarceration, discomfort, and the fact that he is having to manually reduce this more frequently now. I discussed the indications, details, techniques, and numerous risk of the surgery with him. I do not think he is a good candidate for laparoscopic repair because the hernia is so large. We will set aside extra time for the surgery because of this. He is aware of the risks of bleeding, infection, recurrence of the hernia, nerve damage with chronic pain, urinary retention, injury to adjacent organs such as the testicle or bladder or intestine was major reconstructive surgery, although very uncommon. Cardiac pulmonary and thromboembolic problems. He understands all of these issues. All his questions are answered. He agrees with this plan.   Operative Findings:       The patient's indirect running oral hernia was unable to be reduced in the holding area but once he was asleep with muscle relaxation, I was able to reduce it after 4 or 5 minutes of gentle pressure.  I found a large indirect right inguinal hernia.  He also had weakness in the direct space all the  way to the pubic tubercle but only minimal bulge.  There was no sliding component.  He did not have a hydrocele.  The mesh repair was performed in the usual manner.  Procedure in Detail:          Following the induction of general endotracheal anesthesia intravenous antibiotics were given.  Surgical timeout was performed.  The abdomen and genitalia were prepped and draped in a sterile fashion.  0.5% Marcaine was used as local infiltration anesthetic into the skin is obtaining his tissue.  Postoperatively a Tapp block was performed by the anesthesia department.      A transverse incision was made in the right groin overlying the right inguinal canal.  Dissection was carried down through Scarpa's fascia exposing the aponeurosis of the external oblique.  The external oblique  was incised in the direction of its fibers, opening of the external inguinal ring.  The external oblique was dissected away from the underlying structures and self-retaining retractors were placed.  I very carefully dissected somewhat bulky cord structures encircled these with a Penrose drain.  There were 2 large lipomas and I slowly dissected these away from the cord structures and the hernia sac.  I took the lipoma dissection all the way back to the internal ring.  I then clamped and amputated the lipomas and tied this off with 2-0 Vicryl ties.  The indirect sac was very large and had to be dissected out of the scrotum but ultimately I dissected the entire indirect sac back to the internal ring and separated it from the cord structures.  I could insert my finger through the open indirect sac into the peritoneal space and I can feel a femoral artery which was soft without calcifications.  There was no evidence of femoral hernia.      The indirect sac was then twisted and suture ligated at the level of the internal ring with a 2-0 Vicryl suture ligature.  The redundant sac was excised.  The internal ring muscles were tightened with a figure-of-eight suture of 2-0 Vicryl.  After irrigating the wound I repaired the inguinal floor with an onlay graft of ultra Pro mesh.  A three-inch by 6 inch piece of ultra Pro mesh was used and sutured in place with 2-0 Prolene both running and mattress sutures.  The mesh was sutured so as to overlap the pubic tubercle generously, then along the inguinal ligament inferiorly.  Medially, superiorly, and superiolaterally interrupted mattress sutures of 2-0 Prolene were placed.  The mesh was incised laterally so as to wraparound the cord structures at the internal ring.  The tails of the mesh were overlapped laterally and further sutures were placed laterally.  This provided very secure repair both medial and lateral to the internal ring but allowed an adequate fingertip opening for the  cord structures.      A minor neuro-lysis was done laterally.  The nerves were tied off with 2-0 silk ties.  The external oblique was closed with a running suture of 2-0 Vicryl placing the cord structures deep to the external oblique.  Scarpa's fascia was closed with 3-0 Vicryl sutures and the skin closed with a running subcuticular 4-0 Monocryl and Dermabond.  The patient tolerated the procedure well was taken to PACU in stable condition.  EBL 20 mL or less.  Counts correct.  Complications none.      `     Antonio Newton M. Dalbert Batman, M.D., FACS General and Minimally Invasive Surgery Breast and Colorectal Surgery  12/23/2016 11:52 AM

## 2016-12-23 NOTE — Anesthesia Procedure Notes (Signed)
Anesthesia Regional Block:  TAP block  Pre-Anesthetic Checklist: ,, timeout performed, Correct Patient, Correct Site, Correct Laterality, Correct Procedure, Correct Position, site marked, Risks and benefits discussed, pre-op evaluation,  At surgeon's request and post-op pain management  Laterality: Right  Prep: chloraprep       Needles:   Needle Type: Echogenic Needle     Needle Length: 9cm 9 cm Needle Gauge: 21 and 21 G    Additional Needles:  Procedures: ultrasound guided (picture in chart) TAP block Narrative:  Start time: 12/23/2016 11:56 AM End time: 12/23/2016 12:07 PM Injection made incrementally with aspirations every 5 mL.  Performed by: Personally  Anesthesiologist: Lyndle Herrlich

## 2016-12-23 NOTE — Anesthesia Procedure Notes (Signed)
Procedure Name: Intubation Date/Time: 12/23/2016 10:26 AM Performed by: Glory Buff Pre-anesthesia Checklist: Patient identified, Emergency Drugs available, Suction available and Patient being monitored Patient Re-evaluated:Patient Re-evaluated prior to inductionOxygen Delivery Method: Circle system utilized Preoxygenation: Pre-oxygenation with 100% oxygen Intubation Type: IV induction Ventilation: Mask ventilation without difficulty Laryngoscope Size: Glidescope and 4 Grade View: Grade I Tube type: Oral Number of attempts: 2 Airway Equipment and Method: Stylet,  Oral airway and Video-laryngoscopy Placement Confirmation: ETT inserted through vocal cords under direct vision,  positive ETCO2 and breath sounds checked- equal and bilateral Secured at: 22 cm Tube secured with: Tape Dental Injury: Teeth and Oropharynx as per pre-operative assessment  Difficulty Due To: Difficult Airway- due to reduced neck mobility Comments: Easy mask airway with 77mm OAW inserted, DL x 1 with miller 3, unable to visualize glottis, glidescope #4 VC seen oral intubation with 7.5 ETT, BBS= and + ETCO2.

## 2016-12-23 NOTE — Anesthesia Postprocedure Evaluation (Signed)
Anesthesia Post Note  Patient: Antonio Newton  Procedure(s) Performed: Procedure(s) (LRB): OPEN REPAIR RIGHT INGUINAL HERNIA WITH MESH (Right) INSERTION OF MESH (Right)  Patient location during evaluation: PACU Anesthesia Type: General Level of consciousness: sedated Pain management: satisfactory to patient Vital Signs Assessment: post-procedure vital signs reviewed and stable Respiratory status: spontaneous breathing Cardiovascular status: stable Anesthetic complications: no       Last Vitals:  Vitals:   12/23/16 1259 12/23/16 1351  BP: (!) 151/82 (!) 142/78  Pulse: 74 72  Resp: 15 16  Temp: 36.9 C 36.8 C    Last Pain:  Vitals:   12/23/16 1351  TempSrc: Oral  PainSc: 3                  Juliano Mceachin EDWARD

## 2016-12-23 NOTE — Transfer of Care (Signed)
Immediate Anesthesia Transfer of Care Note  Patient: Antonio Newton  Procedure(s) Performed: Procedure(s): OPEN REPAIR RIGHT INGUINAL HERNIA WITH MESH (Right) INSERTION OF MESH (Right)  Patient Location: PACU  Anesthesia Type:General  Level of Consciousness: awake, alert  and oriented  Airway & Oxygen Therapy: Patient Spontanous Breathing and Patient connected to face mask oxygen  Post-op Assessment: Report given to RN and Post -op Vital signs reviewed and stable  Post vital signs: Reviewed and stable  Last Vitals:  Vitals:   12/23/16 0838  BP: (!) 151/85  Pulse: 78  Resp: 18  Temp: 36.7 C    Last Pain:  Vitals:   12/23/16 0838  TempSrc: Oral      Patients Stated Pain Goal: 5 ([id number])  Complications: No apparent anesthesia complications

## 2016-12-24 ENCOUNTER — Telehealth: Payer: Self-pay | Admitting: Neurology

## 2016-12-24 NOTE — Telephone Encounter (Signed)
I spoke with patient and he did have to go back to 1 capsule bid of the gabapentin.  He said that he scheduled an appointment to come see Dr. Posey Pronto on 02-09-17 but I told him since he was just seen in September that he did not need to come in.  Appointment cancelled.

## 2016-12-24 NOTE — Telephone Encounter (Signed)
Antonio Newton [birth year]. He was calling because there had been a change in his Gabapentin medication. He was told to stop taking one of them but he thought that made it worse verses taking the two. He went to pick up the refill and there was a note saying to call the office to make an appointment. He did make one for 02/09/17 to see Dr. Posey Pronto. He would like you to please call him. Thank you

## 2017-01-17 DIAGNOSIS — C61 Malignant neoplasm of prostate: Secondary | ICD-10-CM | POA: Diagnosis not present

## 2017-01-24 DIAGNOSIS — C61 Malignant neoplasm of prostate: Secondary | ICD-10-CM | POA: Diagnosis not present

## 2017-02-09 ENCOUNTER — Ambulatory Visit: Payer: Medicare Other | Admitting: Neurology

## 2017-02-14 ENCOUNTER — Telehealth: Payer: Self-pay | Admitting: Family Medicine

## 2017-02-14 MED ORDER — OMEPRAZOLE 20 MG PO CPDR: 20.0000 mg | DELAYED_RELEASE_CAPSULE | Freq: Every day | ORAL | 5 refills | Status: DC

## 2017-02-14 NOTE — Telephone Encounter (Signed)
° ° °  Pt request refill of the following:  omeprazole (PRILOSEC) 20 MG capsule    Phamacy:  [place]

## 2017-02-14 NOTE — Telephone Encounter (Signed)
Rx sent in to pt pharmacy

## 2017-02-16 ENCOUNTER — Other Ambulatory Visit: Payer: Self-pay | Admitting: Emergency Medicine

## 2017-02-16 MED ORDER — GABAPENTIN 300 MG PO CAPS: 300.0000 mg | ORAL_CAPSULE | Freq: Two times a day (BID) | ORAL | 3 refills | Status: DC

## 2017-02-17 ENCOUNTER — Other Ambulatory Visit: Payer: Self-pay | Admitting: Emergency Medicine

## 2017-02-17 MED ORDER — LOSARTAN POTASSIUM-HCTZ 100-12.5 MG PO TABS: 1.0000 | ORAL_TABLET | Freq: Every day | ORAL | 4 refills | Status: DC

## 2017-02-17 MED ORDER — OMEPRAZOLE 20 MG PO CPDR: 20.0000 mg | DELAYED_RELEASE_CAPSULE | Freq: Every day | ORAL | 5 refills | Status: DC

## 2017-02-21 ENCOUNTER — Other Ambulatory Visit: Payer: Self-pay | Admitting: Emergency Medicine

## 2017-02-21 MED ORDER — AMLODIPINE BESYLATE 10 MG PO TABS: 10.0000 mg | ORAL_TABLET | Freq: Every day | ORAL | 4 refills | Status: DC

## 2017-02-28 DIAGNOSIS — I10 Essential (primary) hypertension: Secondary | ICD-10-CM | POA: Diagnosis not present

## 2017-02-28 DIAGNOSIS — M4712 Other spondylosis with myelopathy, cervical region: Secondary | ICD-10-CM | POA: Diagnosis not present

## 2017-06-10 ENCOUNTER — Telehealth: Payer: Self-pay | Admitting: Family Medicine

## 2017-06-10 ENCOUNTER — Other Ambulatory Visit: Payer: Self-pay

## 2017-06-10 ENCOUNTER — Encounter: Payer: Self-pay | Admitting: Adult Health

## 2017-06-10 ENCOUNTER — Other Ambulatory Visit: Payer: Self-pay | Admitting: Adult Health

## 2017-06-10 ENCOUNTER — Ambulatory Visit (INDEPENDENT_AMBULATORY_CARE_PROVIDER_SITE_OTHER): Payer: Medicare Other | Admitting: Adult Health

## 2017-06-10 VITALS — BP 120/72 | Temp 98.3°F | Ht 73.0 in | Wt 193.8 lb

## 2017-06-10 DIAGNOSIS — M6289 Other specified disorders of muscle: Secondary | ICD-10-CM

## 2017-06-10 LAB — BASIC METABOLIC PANEL
BUN: 22 mg/dL (ref 6–23)
CO2: 29 meq/L (ref 19–32)
CREATININE: 1.11 mg/dL (ref 0.40–1.50)
Calcium: 9.5 mg/dL (ref 8.4–10.5)
Chloride: 101 mEq/L (ref 96–112)
GFR: 84.11 mL/min (ref >60.00)
GLUCOSE: 107 mg/dL — AB (ref 70–99)
Potassium: 3.2 mEq/L — ABNORMAL LOW (ref 3.5–5.1)
SODIUM: 139 meq/L (ref 135–145)

## 2017-06-10 LAB — MAGNESIUM: Magnesium: 1.9 mg/dL (ref 1.5–2.5)

## 2017-06-10 MED ORDER — METHOCARBAMOL 500 MG PO TABS: 500.0000 mg | ORAL_TABLET | Freq: Three times a day (TID) | ORAL | 0 refills | Status: DC

## 2017-06-10 MED ORDER — POTASSIUM CHLORIDE ER 10 MEQ PO TBCR: 10.0000 meq | EXTENDED_RELEASE_TABLET | Freq: Every day | ORAL | 1 refills | Status: DC

## 2017-06-10 NOTE — Progress Notes (Signed)
Subjective:    Patient ID: Antonio Newton, male    DOB: 1946/06/17, 71 y.o.   MRN: [id number]  HPI  71 year old male who  has a past medical history of Allergy; Anxiety; Cancer [facility]); ED (erectile dysfunction); Heart murmur; History of hiatal hernia; Hypertension; Incarcerated right inguinal hernia (12/23/2016); Neuromuscular disorder ([place]); Neuropathy ([place]); OA (osteoarthritis); and Pneumonia.  He is a patient of Dr. Sherren Mocha, who I am seeing today for the first time. He presents to the office today for two months of tightness in the back of bilateral lower legs. He reports that this " tightness has been present for the last two months. He feels as though this feeling is worse when walking and changing positions. When he is sitting or laying down he does not feel tightness. He stays active with gardening. Feels as though the tightness improves throughout the day    He denies any trauma or falls. Has not noticed any bruising. No changes in the color of his urine   Review of Systems See HPI   Past Medical History:  Diagnosis Date  . Allergy   . Anxiety   . Cancer [facility])    prostate- Dr. Vickey Sages waiting"  . ED (erectile dysfunction)   . Heart murmur    pt thinks he had a murmur many years ago.  Not sure now.maw  . History of hiatal hernia    inguninal  . Hypertension   . Incarcerated right inguinal hernia 12/23/2016  . Neuromuscular disorder ([facility])    neuropathy - mildly in bil hands-is improved since neck surgery  . Neuropathy   . OA (osteoarthritis)    left hip  . Pneumonia     Social History   Social History  . Marital status: Married    Spouse name: N/A  . Number of children: 3  . Years of education: N/A   Occupational History  . Realtor    Social History Main Topics  . Smoking status: Never Smoker  . Smokeless tobacco: Never Used  . Alcohol use 0.0 oz/week     Comment: Occasionally (few times per month)  . Drug use: No  . Sexual activity: Not on  file   Other Topics Concern  . Not on file   Social History Narrative   Works as a Nature conservation officer   Lives with wife.  They have three grown children (2 daughters and 1 son).    Past Surgical History:  Procedure Laterality Date  . ANTERIOR CERVICAL DECOMP/DISCECTOMY FUSION N/A 02/06/2016   Procedure: Cevical three-four, Cervical four-five, Cervical five-six anterior cervical decompression with fusion interbody prosthesis plating and bonegraft;  Surgeon: Consuella Lose, MD;  Location: [place] [facility];  Service: Neurosurgery;  Laterality: N/A;  . COLONOSCOPY    . INGUINAL HERNIA REPAIR Right 12/23/2016   Procedure: OPEN REPAIR RIGHT INGUINAL HERNIA WITH MESH;  Surgeon: Fanny Skates, MD;  Location: [facility];  Service: General;  Laterality: Right;  . INSERTION OF MESH Right 12/23/2016   Procedure: INSERTION OF MESH;  Surgeon: Fanny Skates, MD;  Location: [facility];  Service: General;  Laterality: Right;  . None    . postate biopsy      Family History  Problem Relation Age of Onset  . Hypertension Father        Deceased, 53  . Hypertension Mother        Deceased, 22  . Healthy Sister   . Esophageal cancer Paternal Grandfather   .  Colon cancer Neg Hx   . Rectal cancer Neg Hx   . Stomach cancer Neg Hx     Allergies  Allergen Reactions  . Penicillins Other (See Comments)    REACTION: family hx Has patient had a PCN reaction causing immediate rash, facial/tongue/throat swelling, SOB or lightheadedness with hypotension:No Has patient had a PCN reaction causing severe rash involving mucus membranes or skin necrosis: No Has patient had a PCN reaction that required hospitalization No Has patient had a PCN reaction occurring within the last 10 years: No If all of the above answers are "NO", then may proceed with Cephalosporin use.     Current Outpatient Prescriptions on File Prior to Visit  Medication Sig Dispense Refill  . amLODipine (NORVASC) 10 MG tablet  Take 1 tablet (10 mg total) by mouth daily. 90 tablet 4  . aspirin EC 81 MG tablet Take 81 mg by mouth daily.    . Azelastine HCl 0.15 % SOLN Place 2 sprays into the nose 2 (two) times daily. (Patient taking differently: Place 2 sprays into the nose 2 (two) times daily as needed (allergies). ) 30 mL 5  . gabapentin (NEURONTIN) 300 MG capsule Take 1 capsule (300 mg total) by mouth 2 (two) times daily. 180 capsule 3  . Glycerin-Hypromellose-PEG 400 (VISINE TIRED EYE RELIEF) 0.2-0.36-1 % SOLN Place 1-2 drops into both eyes 3 (three) times daily as needed (for dry/tired eyes.).    Marland Kitchen ibuprofen (ADVIL,MOTRIN) 200 MG tablet Take 400 mg by mouth 2 (two) times daily as needed (for pain relief.).    Marland Kitchen losartan-hydrochlorothiazide (HYZAAR) 100-12.5 MG tablet Take 1 tablet by mouth daily. 100 tablet 4  . Multiple Vitamin (CALCIUM COMPLEX PO) Take 1 capsule by mouth every evening. GNC CALCIMATE COMPLETE  VITAMIN D3 2000 UNITS-VITAMIN K2 50 MCG-CALCIUM CITRATE 800 MG- MAGNESIUM OXIDE 100 MG-ZINC OXIDE 7.5 MG-COPPER GLYCINATE 1 MG -MANGANESE GLUCONATE 1 MG- MBP 40 MG & BORON 1 MG    . Multiple Vitamin (MULTIVITAMIN WITH MINERALS) TABS tablet Take 2 tablets by mouth daily at 2 PM. ULTRA MEGA GOLD without iron    . Omega-3 Fatty Acids (FISH OIL TRIPLE STRENGTH) 1400 MG CAPS Take 1,400 mg by mouth daily at 2 PM.     . omeprazole (PRILOSEC) 20 MG capsule Take 1 capsule (20 mg total) by mouth daily. 100 capsule 5  . sildenafil (VIAGRA) 100 MG tablet Take 1 tablet (100 mg total) by mouth daily as needed. (Patient taking differently: Take 100 mg by mouth daily as needed for erectile dysfunction. ) 10 tablet 11  . tiZANidine (ZANAFLEX) 2 MG tablet Take 1 tablet (2 mg total) by mouth at bedtime as needed for muscle spasms. 30 tablet 3   No current facility-administered medications on file prior to visit.     BP 120/72 (BP Location: Left Arm, Patient Position: Sitting, Cuff Size: Normal)   Temp 98.3 F (36.8 C) (Oral)    Ht 6\' 1"  (1.854 m)   Wt 193 lb 12.8 oz (87.9 kg)   BMI 25.57 kg/m       Objective:   Physical Exam  Constitutional: He is oriented to person, place, and time. He appears well-developed and well-nourished. No distress.  Cardiovascular: Normal rate, regular rhythm, normal heart sounds and intact distal pulses.  Exam reveals no gallop and no friction rub.   No murmur heard. Musculoskeletal: Normal range of motion.  Tightness in bilateral hamstrings and calf muscles with straight leg raise, knee to chest, and bending at  waist  Steady gait   Neurological: He is alert and oriented to person, place, and time.  Skin: Skin is warm and dry. No rash noted. He is not diaphoretic. No erythema. No pallor.  Psychiatric: He has a normal mood and affect. His behavior is normal. Judgment and thought content normal.  Nursing note and vitals reviewed.     Assessment & Plan:  1. Muscle tightness - Appears to tight muscle in hamstring and calf. No signs of PVD or DVT. Advised stretching exerises - methocarbamol (ROBAXIN) 500 MG tablet; Take 1 tablet (500 mg total) by mouth 3 (three) times daily.  Dispense: 30 tablet; Refill: 0 - Basic metabolic panel - Magnesium - CK Total and CKMB  Dorothyann Peng, NP

## 2017-06-10 NOTE — Telephone Encounter (Signed)
Pt states [facility] has the  methocarbamol (ROBAXIN) 500 MG tablet  Backordered until next week.  Pt does not want to wait until then to get this med. Please send to  [address]

## 2017-06-10 NOTE — Telephone Encounter (Signed)
Rx has been sent to preferred pharmacy.  

## 2017-06-11 LAB — CK TOTAL AND CKMB (NOT AT ARMC)
CK, MB: 2.4 ng/mL (ref 0.0–5.0)
RELATIVE INDEX: 1.3 (ref 0.0–4.0)
Total CK: 191 U/L (ref 44–196)

## 2017-06-14 ENCOUNTER — Telehealth: Payer: Self-pay | Admitting: Family Medicine

## 2017-06-14 NOTE — Telephone Encounter (Signed)
See how he is doing and let him know that Robaxin is on backorder. We can prescribe a different muscle relaxer if needed

## 2017-06-14 NOTE — Telephone Encounter (Signed)
Is there an alternative medication that can be prescribed?

## 2017-06-14 NOTE — Telephone Encounter (Signed)
Received a fax from the pharmacy.  Methocarbamol 500 mg and 750 mg are on back order.  Pharmacy would like a different prescription.  Methocarbamol also requires a prior authorization.  Please advise.

## 2017-06-14 NOTE — Telephone Encounter (Signed)
Patient has already picked up Robaxin from a different pharmacy.  Patient states he is taking this medication and he is feeling better.  Nothing further needed.

## 2017-06-28 ENCOUNTER — Encounter: Payer: Self-pay | Admitting: Adult Health

## 2017-06-28 ENCOUNTER — Ambulatory Visit (INDEPENDENT_AMBULATORY_CARE_PROVIDER_SITE_OTHER): Payer: Medicare Other | Admitting: Adult Health

## 2017-06-28 VITALS — BP 142/82 | HR 73 | Temp 98.0°F | Ht 73.0 in | Wt 192.4 lb

## 2017-06-28 DIAGNOSIS — M6289 Other specified disorders of muscle: Secondary | ICD-10-CM

## 2017-06-28 DIAGNOSIS — E876 Hypokalemia: Secondary | ICD-10-CM

## 2017-06-28 LAB — BASIC METABOLIC PANEL
BUN: 23 mg/dL (ref 6–23)
CO2: 32 meq/L (ref 19–32)
Calcium: 9.6 mg/dL (ref 8.4–10.5)
Chloride: 101 mEq/L (ref 96–112)
Creatinine, Ser: 1.14 mg/dL (ref 0.40–1.50)
GFR: 81.55 mL/min (ref >60.00)
GLUCOSE: 116 mg/dL — AB (ref 70–99)
POTASSIUM: 3.6 meq/L (ref 3.5–5.1)
SODIUM: 140 meq/L (ref 135–145)

## 2017-06-28 MED ORDER — METHOCARBAMOL 500 MG PO TABS: 500.0000 mg | ORAL_TABLET | Freq: Every day | ORAL | 0 refills | Status: DC | PRN

## 2017-06-28 NOTE — Patient Instructions (Signed)
WE NOW OFFER   [facility] FAST TRACK!!!  SAME DAY Appointments for ACUTE CARE  Such as: Sprains, Injuries, cuts, abrasions, rashes, muscle pain, joint pain, back pain Colds, flu, sore throats, headache, allergies, cough, fever  Ear pain, sinus and eye infections Abdominal pain, nausea, vomiting, diarrhea, upset stomach Animal/insect bites  3 Easy Ways to Schedule: Walk-In Scheduling Call in scheduling Mychart Sign-up: [email or web address]         

## 2017-06-28 NOTE — Progress Notes (Signed)
Subjective:    Patient ID: Antonio Newton, male    DOB: 07-Apr-1946, 71 y.o.   MRN: [id number]  HPI  71 year old male who  has a past medical history of Allergy; Anxiety; Cancer [facility]); ED (erectile dysfunction); Heart murmur; History of hiatal hernia; Hypertension; Incarcerated right inguinal hernia (12/23/2016); Neuromuscular disorder ([place]); Neuropathy; OA (osteoarthritis); and Pneumonia. He presents to the office today for medication management. During his last visit for tightness in the back of his legs, labs showed a potassium of 3.2. He was placed on Kdur 10 meq. He denies any CP or leg cramping   He reports that since taking Robaxin his symptoms have improved and he does not feel as tight in his lower extremities. He is stretching more often but is not exercising as much as he would like.   Review of Systems See HPI   Past Medical History:  Diagnosis Date  . Allergy   . Anxiety   . Cancer [facility])    prostate- Dr. Vickey Sages waiting"  . ED (erectile dysfunction)   . Heart murmur    pt thinks he had a murmur many years ago.  Not sure now.maw  . History of hiatal hernia    inguninal  . Hypertension   . Incarcerated right inguinal hernia 12/23/2016  . Neuromuscular disorder ([facility])    neuropathy - mildly in bil hands-is improved since neck surgery  . Neuropathy   . OA (osteoarthritis)    left hip  . Pneumonia     Social History   Social History  . Marital status: Married    Spouse name: N/A  . Number of children: 3  . Years of education: N/A   Occupational History  . Realtor    Social History Main Topics  . Smoking status: Never Smoker  . Smokeless tobacco: Never Used  . Alcohol use 0.0 oz/week     Comment: Occasionally (few times per month)  . Drug use: No  . Sexual activity: Not on file   Other Topics Concern  . Not on file   Social History Narrative   Works as a Nature conservation officer   Lives with wife.  They have three  grown children (2 daughters and 1 son).    Past Surgical History:  Procedure Laterality Date  . ANTERIOR CERVICAL DECOMP/DISCECTOMY FUSION N/A 02/06/2016   Procedure: Cevical three-four, Cervical four-five, Cervical five-six anterior cervical decompression with fusion interbody prosthesis plating and bonegraft;  Surgeon: Consuella Lose, MD;  Location: [place] [facility];  Service: Neurosurgery;  Laterality: N/A;  . COLONOSCOPY    . INGUINAL HERNIA REPAIR Right 12/23/2016   Procedure: OPEN REPAIR RIGHT INGUINAL HERNIA WITH MESH;  Surgeon: Fanny Skates, MD;  Location: [facility];  Service: General;  Laterality: Right;  . INSERTION OF MESH Right 12/23/2016   Procedure: INSERTION OF MESH;  Surgeon: Fanny Skates, MD;  Location: [facility];  Service: General;  Laterality: Right;  . None    . postate biopsy      Family History  Problem Relation Age of Onset  . Hypertension Father        Deceased, 14  . Hypertension Mother        Deceased, 80  . Healthy Sister   . Esophageal cancer Paternal Grandfather   . Colon cancer Neg Hx   . Rectal cancer Neg Hx   . Stomach cancer Neg Hx     Allergies  Allergen Reactions  . Penicillins Other (See  Comments)    REACTION: family hx Has patient had a PCN reaction causing immediate rash, facial/tongue/throat swelling, SOB or lightheadedness with hypotension:No Has patient had a PCN reaction causing severe rash involving mucus membranes or skin necrosis: No Has patient had a PCN reaction that required hospitalization No Has patient had a PCN reaction occurring within the last 10 years: No If all of the above answers are "NO", then may proceed with Cephalosporin use.     Current Outpatient Prescriptions on File Prior to Visit  Medication Sig Dispense Refill  . amLODipine (NORVASC) 10 MG tablet Take 1 tablet (10 mg total) by mouth daily. 90 tablet 4  . aspirin EC 81 MG tablet Take 81 mg by mouth daily.    . Azelastine HCl 0.15 % SOLN Place 2 sprays into the  nose 2 (two) times daily. (Patient taking differently: Place 2 sprays into the nose 2 (two) times daily as needed (allergies). ) 30 mL 5  . gabapentin (NEURONTIN) 300 MG capsule Take 1 capsule (300 mg total) by mouth 2 (two) times daily. 180 capsule 3  . Glycerin-Hypromellose-PEG 400 (VISINE TIRED EYE RELIEF) 0.2-0.36-1 % SOLN Place 1-2 drops into both eyes 3 (three) times daily as needed (for dry/tired eyes.).    Marland Kitchen ibuprofen (ADVIL,MOTRIN) 200 MG tablet Take 400 mg by mouth 2 (two) times daily as needed (for pain relief.).    Marland Kitchen losartan-hydrochlorothiazide (HYZAAR) 100-12.5 MG tablet Take 1 tablet by mouth daily. 100 tablet 4  . Multiple Vitamin (CALCIUM COMPLEX PO) Take 1 capsule by mouth every evening. GNC CALCIMATE COMPLETE  VITAMIN D3 2000 UNITS-VITAMIN K2 50 MCG-CALCIUM CITRATE 800 MG- MAGNESIUM OXIDE 100 MG-ZINC OXIDE 7.5 MG-COPPER GLYCINATE 1 MG -MANGANESE GLUCONATE 1 MG- MBP 40 MG & BORON 1 MG    . Multiple Vitamin (MULTIVITAMIN WITH MINERALS) TABS tablet Take 2 tablets by mouth daily at 2 PM. ULTRA MEGA GOLD without iron    . Omega-3 Fatty Acids (FISH OIL TRIPLE STRENGTH) 1400 MG CAPS Take 1,400 mg by mouth daily at 2 PM.     . omeprazole (PRILOSEC) 20 MG capsule Take 1 capsule (20 mg total) by mouth daily. 100 capsule 5  . potassium chloride (K-DUR) 10 MEQ tablet Take 1 tablet (10 mEq total) by mouth daily. 90 tablet 1  . sildenafil (VIAGRA) 100 MG tablet Take 1 tablet (100 mg total) by mouth daily as needed. (Patient taking differently: Take 100 mg by mouth daily as needed for erectile dysfunction. ) 10 tablet 11   No current facility-administered medications on file prior to visit.     BP (!) 142/82 (BP Location: Left Arm, Patient Position: Sitting, Cuff Size: Normal)   Pulse 73   Temp 98 F (36.7 C) (Oral)   Ht 6\' 1"  (1.854 m)   Wt 192 lb 6.4 oz (87.3 kg)   SpO2 97%   BMI 25.38 kg/m       Objective:   Physical Exam  Constitutional: He is oriented to person, place, and  time. He appears well-developed and well-nourished. No distress.  Cardiovascular: Normal rate, regular rhythm, normal heart sounds and intact distal pulses.  Exam reveals no gallop and no friction rub.   No murmur heard. Pulmonary/Chest: Effort normal and breath sounds normal. No respiratory distress. He has no wheezes. He has no rales. He exhibits no tenderness.  Neurological: He is alert and oriented to person, place, and time.  Skin: Skin is warm and dry. No rash noted. He is not diaphoretic. No erythema.  No pallor.  Psychiatric: He has a normal mood and affect. His behavior is normal. Judgment and thought content normal.  Vitals reviewed.     Assessment & Plan:  1. Muscle tightness - methocarbamol (ROBAXIN) 500 MG tablet; Take 1 tablet (500 mg total) by mouth daily as needed for muscle spasms.  Dispense: 30 tablet; Refill: 0  2. Hypokalemia  - Basic metabolic panel  Dorothyann Peng, NP

## 2017-07-20 DIAGNOSIS — C61 Malignant neoplasm of prostate: Secondary | ICD-10-CM | POA: Diagnosis not present

## 2017-07-21 ENCOUNTER — Ambulatory Visit (INDEPENDENT_AMBULATORY_CARE_PROVIDER_SITE_OTHER): Payer: Medicare Other | Admitting: Family Medicine

## 2017-07-21 ENCOUNTER — Encounter: Payer: Self-pay | Admitting: Family Medicine

## 2017-07-21 VITALS — BP 122/70 | HR 87 | Temp 98.9°F | Ht 73.0 in | Wt 188.3 lb

## 2017-07-21 DIAGNOSIS — R252 Cramp and spasm: Secondary | ICD-10-CM

## 2017-07-21 DIAGNOSIS — R5383 Other fatigue: Secondary | ICD-10-CM | POA: Diagnosis not present

## 2017-07-21 DIAGNOSIS — R000 Tachycardia, unspecified: Secondary | ICD-10-CM

## 2017-07-21 DIAGNOSIS — E876 Hypokalemia: Secondary | ICD-10-CM | POA: Diagnosis not present

## 2017-07-21 DIAGNOSIS — R002 Palpitations: Secondary | ICD-10-CM

## 2017-07-21 NOTE — Progress Notes (Signed)
HPI:  Acute visit, walk in for "not feeling well": -x 2. 5 months - intermittent, a few times per week -symptoms: sometimes feels tired or feels like he is in a fog (usually at rest), intermittently leg cramps bilat when goes to stand up, occ palpitations at rest, "flushed" feeling in entire abdomen for a few minutes earlier today  -reports has been seeing his PCP for this and potatssium is off - wants to check as will be going to a funeral out of town tomorrow -he seems concerned that his medications may be causing his symptoms -hx anxiety  -denies CP, SOB, DOE, dizziness, fevers, malaise, worsening of symptoms   ROS: See pertinent positives and negatives per HPI.  Past Medical History:  Diagnosis Date  . Allergy   . Anxiety   . Cancer [facility])    prostate- Dr. Vickey Newton waiting"  . ED (erectile dysfunction)   . Heart murmur    pt thinks he had a murmur many years ago.  Not sure now.maw  . History of hiatal hernia    inguninal  . Hypertension   . Incarcerated right inguinal hernia 12/23/2016  . Neuromuscular disorder (HCC)    neuropathy - mildly in bil hands-is improved since neck surgery  . Neuropathy   . OA (osteoarthritis)    left hip  . Pneumonia     Past Surgical History:  Procedure Laterality Date  . ANTERIOR CERVICAL DECOMP/DISCECTOMY FUSION N/A 02/06/2016   Procedure: Cevical three-four, Cervical four-five, Cervical five-six anterior cervical decompression with fusion interbody prosthesis plating and bonegraft;  Surgeon: Antonio Lose, MD;  Location: [place] [facility];  Service: Neurosurgery;  Laterality: N/A;  . COLONOSCOPY    . INGUINAL HERNIA REPAIR Right 12/23/2016   Procedure: OPEN REPAIR RIGHT INGUINAL HERNIA WITH MESH;  Surgeon: Antonio Skates, MD;  Location: [facility];  Service: General;  Laterality: Right;  . INSERTION OF MESH Right 12/23/2016   Procedure: INSERTION OF MESH;  Surgeon: Antonio Skates, MD;  Location: [facility];  Service: General;   Laterality: Right;  . None    . postate biopsy      Family History  Problem Relation Age of Onset  . Hypertension Father        Deceased, 29  . Hypertension Mother        Deceased, 23  . Healthy Sister   . Esophageal cancer Paternal Grandfather   . Colon cancer Neg Hx   . Rectal cancer Neg Hx   . Stomach cancer Neg Hx     Social History   Social History  . Marital status: Married    Spouse name: N/A  . Number of children: 3  . Years of education: N/A   Occupational History  . Realtor    Social History Main Topics  . Smoking status: Never Smoker  . Smokeless tobacco: Never Used  . Alcohol use 0.0 oz/week     Comment: Occasionally (few times per month)  . Drug use: No  . Sexual activity: Not Asked   Other Topics Concern  . None   Social History Narrative   Works as a Nature conservation officer   Lives with wife.  They have three grown children (2 daughters and 1 son).     Current Outpatient Prescriptions:  .  amLODipine (NORVASC) 10 MG tablet, Take 1 tablet (10 mg total) by mouth daily., Disp: 90 tablet, Rfl: 4 .  aspirin EC 81 MG tablet, Take 81 mg by mouth daily., Disp: ,  Rfl:  .  Azelastine HCl 0.15 % SOLN, Place 2 sprays into the nose 2 (two) times daily. (Patient taking differently: Place 2 sprays into the nose 2 (two) times daily as needed (allergies). ), Disp: 30 mL, Rfl: 5 .  gabapentin (NEURONTIN) 300 MG capsule, Take 1 capsule (300 mg total) by mouth 2 (two) times daily., Disp: 180 capsule, Rfl: 3 .  Glycerin-Hypromellose-PEG 400 (VISINE TIRED EYE RELIEF) 0.2-0.36-1 % SOLN, Place 1-2 drops into both eyes 3 (three) times daily as needed (for dry/tired eyes.)., Disp: , Rfl:  .  ibuprofen (ADVIL,MOTRIN) 200 MG tablet, Take 400 mg by mouth 2 (two) times daily as needed (for pain relief.)., Disp: , Rfl:  .  losartan-hydrochlorothiazide (HYZAAR) 100-12.5 MG tablet, Take 1 tablet by mouth daily., Disp: 100 tablet, Rfl: 4 .  methocarbamol (ROBAXIN)  500 MG tablet, Take 1 tablet (500 mg total) by mouth daily as needed for muscle spasms., Disp: 30 tablet, Rfl: 0 .  Multiple Vitamin (CALCIUM COMPLEX PO), Take 1 capsule by mouth every evening. GNC CALCIMATE COMPLETE  VITAMIN D3 2000 UNITS-VITAMIN K2 50 MCG-CALCIUM CITRATE 800 MG- MAGNESIUM OXIDE 100 MG-ZINC OXIDE 7.5 MG-COPPER GLYCINATE 1 MG -MANGANESE GLUCONATE 1 MG- MBP 40 MG & BORON 1 MG, Disp: , Rfl:  .  Multiple Vitamin (MULTIVITAMIN WITH MINERALS) TABS tablet, Take 2 tablets by mouth daily at 2 PM. ULTRA MEGA GOLD without iron, Disp: , Rfl:  .  Omega-3 Fatty Acids (FISH OIL TRIPLE STRENGTH) 1400 MG CAPS, Take 1,400 mg by mouth daily at 2 PM. , Disp: , Rfl:  .  omeprazole (PRILOSEC) 20 MG capsule, Take 1 capsule (20 mg total) by mouth daily., Disp: 100 capsule, Rfl: 5 .  potassium chloride (K-DUR) 10 MEQ tablet, Take 1 tablet (10 mEq total) by mouth daily., Disp: 90 tablet, Rfl: 1 .  sildenafil (VIAGRA) 100 MG tablet, Take 1 tablet (100 mg total) by mouth daily as needed. (Patient taking differently: Take 100 mg by mouth daily as needed for erectile dysfunction. ), Disp: 10 tablet, Rfl: 11  EXAM:  Vitals:   07/21/17 1657  BP: 122/70  Pulse: 87  Temp: 98.9 F (37.2 C)    Body mass index is 24.84 kg/m.  GENERAL: vitals reviewed and listed above, alert, oriented, appears well hydrated and in no acute distress  HEENT: atraumatic, conjunttiva clear, no obvious abnormalities on inspection of external nose and ears  NECK: no obvious masses on inspection  LUNGS: clear to auscultation bilaterally, no wheezes, rales or rhonchi, good air movement  CV: HRRR, no peripheral edema  MS: moves all extremities without noticeable abnormality  PSYCH: pleasant and cooperative, no obvious depression or anxiety  ASSESSMENT AND PLAN:  Discussed the following assessment and plan:  Tachycardia - Plan: EKG 12-Lead  Fatigue, unspecified type - Plan: Basic metabolic panel, CBC, Hemoglobin A1c,  TSH  Leg cramps  Hypokalemia - Plan: Basic metabolic panel  Palpitation  -we discussed possible serious and likely etiologies, workup and treatment, treatment risks and return precautions - symptoms are vague and varied -after this discussion, Antonio Newton opted for EKG (showed NSR) and labs (see orders), minimizing stress and caffeine, healthy diet, follow up with PCP, continue current medications for now -may need holter monitor, anxiety treatment, other eval if these tests and studies are unrevealing and continues to have symptoms -follow up advised in 1-2 weeks -of course, we advised Antonio Newton  to return or notify a doctor immediately if symptoms worsen or persist or new concerns arise.  Patient Instructions  BEFORE YOU LEAVE: -follow up: 1-2 weeks with PCP -labs (can schedule lab appointment since lab closed)  Minimize stress and caffeine.  Eat a healthy low sugar diet.  We have ordered labs or studies at this visit. It can take up to 1-2 weeks for results and processing. IF results require follow up or explanation, we will call you with instructions. Clinically stable results will be released to your [facility]. If you have not heard from [place] or cannot find your results in [facility] in 2 weeks please contact our office at [phone].  If you are not yet signed up for [facility], please consider signing up.  We recommend the following healthy lifestyle for LIFE: 1) Small portions. Regular healthy meals.  2) Eat a healthy clean diet.   TRY TO EAT: -at least 5-7 servings of low sugar vegetables per day (not corn, potatoes or bananas.) -berries are the best choice if you wish to eat fruit.   -lean meets (fish, chicken or [place] breasts) -vegan proteins for some meals - beans or tofu, whole grains, nuts and seeds -Replace bad fats with good fats - good fats include: fish, nuts and seeds, canola oil, olive oil -small amounts of low fat or non fat dairy -small amounts of100 % whole grains -  check the lables  AVOID: -SUGAR, sweets, anything with added sugar, corn syrup or sweeteners -if you must have a sweetener, small amounts of stevia may be best -sweetened beverages -simple starches (rice, bread, potatoes, pasta, chips, etc - small amounts of 100% whole grains are ok) -red meat, pork, butter -fried foods, fast food, processed food, excessive dairy, eggs and coconut.  3)Get at least 150 minutes of sweaty aerobic exercise per week.  4)Reduce stress - consider counseling, meditation and relaxation to balance other aspects of your life.         Colin Benton R., DO

## 2017-07-21 NOTE — Patient Instructions (Signed)
BEFORE YOU LEAVE: -follow up: 1-2 weeks with PCP -labs (can schedule lab appointment since lab closed)  Minimize stress and caffeine.  Eat a healthy low sugar diet.  We have ordered labs or studies at this visit. It can take up to 1-2 weeks for results and processing. IF results require follow up or explanation, we will call you with instructions. Clinically stable results will be released to your [facility]. If you have not heard from [place] or cannot find your results in [facility] in 2 weeks please contact our office at [phone].  If you are not yet signed up for [facility], please consider signing up.  We recommend the following healthy lifestyle for LIFE: 1) Small portions. Regular healthy meals.  2) Eat a healthy clean diet.   TRY TO EAT: -at least 5-7 servings of low sugar vegetables per day (not corn, potatoes or bananas.) -berries are the best choice if you wish to eat fruit.   -lean meets (fish, chicken or [place] breasts) -vegan proteins for some meals - beans or tofu, whole grains, nuts and seeds -Replace bad fats with good fats - good fats include: fish, nuts and seeds, canola oil, olive oil -small amounts of low fat or non fat dairy -small amounts of100 % whole grains - check the lables  AVOID: -SUGAR, sweets, anything with added sugar, corn syrup or sweeteners -if you must have a sweetener, small amounts of stevia may be best -sweetened beverages -simple starches (rice, bread, potatoes, pasta, chips, etc - small amounts of 100% whole grains are ok) -red meat, pork, butter -fried foods, fast food, processed food, excessive dairy, eggs and coconut.  3)Get at least 150 minutes of sweaty aerobic exercise per week.  4)Reduce stress - consider counseling, meditation and relaxation to balance other aspects of your life.

## 2017-07-22 ENCOUNTER — Other Ambulatory Visit: Payer: Medicare Other

## 2017-07-22 LAB — BASIC METABOLIC PANEL
BUN: 19 mg/dL (ref 6–23)
CHLORIDE: 100 meq/L (ref 96–112)
CO2: 31 meq/L (ref 19–32)
Calcium: 9.1 mg/dL (ref 8.4–10.5)
Creatinine, Ser: 1.11 mg/dL (ref 0.40–1.50)
GFR: 84.08 mL/min (ref >60.00)
GLUCOSE: 109 mg/dL — AB (ref 70–99)
POTASSIUM: 3.2 meq/L — AB (ref 3.5–5.1)
SODIUM: 140 meq/L (ref 135–145)

## 2017-07-22 LAB — CBC
HEMATOCRIT: 41 % (ref 39.0–52.0)
HEMOGLOBIN: 14 g/dL (ref 13.0–17.0)
MCHC: 34.2 g/dL (ref 30.0–36.0)
MCV: 88.1 fl (ref 78.0–100.0)
PLATELETS: 191 10*3/uL (ref 150.0–400.0)
RBC: 4.65 Mil/uL (ref 4.22–5.81)
RDW: 12.9 % (ref 11.5–15.5)
WBC: 5.1 10*3/uL (ref 4.0–10.5)

## 2017-07-22 LAB — HEMOGLOBIN A1C: Hgb A1c MFr Bld: 4.7 % (ref 4.6–6.5)

## 2017-07-22 LAB — TSH: TSH: 2.01 u[IU]/mL (ref 0.35–4.50)

## 2017-07-27 DIAGNOSIS — N400 Enlarged prostate without lower urinary tract symptoms: Secondary | ICD-10-CM | POA: Diagnosis not present

## 2017-07-27 DIAGNOSIS — C61 Malignant neoplasm of prostate: Secondary | ICD-10-CM | POA: Diagnosis not present

## 2017-08-02 ENCOUNTER — Telehealth: Payer: Self-pay | Admitting: Family Medicine

## 2017-08-02 ENCOUNTER — Ambulatory Visit (INDEPENDENT_AMBULATORY_CARE_PROVIDER_SITE_OTHER): Payer: Medicare Other | Admitting: Adult Health

## 2017-08-02 VITALS — BP 150/88 | Temp 98.4°F | Ht 73.0 in | Wt 186.0 lb

## 2017-08-02 DIAGNOSIS — R002 Palpitations: Secondary | ICD-10-CM

## 2017-08-02 DIAGNOSIS — E876 Hypokalemia: Secondary | ICD-10-CM | POA: Diagnosis not present

## 2017-08-02 LAB — BASIC METABOLIC PANEL
BUN: 19 mg/dL (ref 6–23)
CO2: 34 meq/L — AB (ref 19–32)
Calcium: 9.2 mg/dL (ref 8.4–10.5)
Chloride: 100 mEq/L (ref 96–112)
Creatinine, Ser: 1.01 mg/dL (ref 0.40–1.50)
GFR: 93.75 mL/min (ref >60.00)
GLUCOSE: 113 mg/dL — AB (ref 70–99)
Potassium: 3.7 mEq/L (ref 3.5–5.1)
SODIUM: 140 meq/L (ref 135–145)

## 2017-08-02 MED ORDER — FLUTICASONE PROPIONATE 50 MCG/ACT NA SUSP: 2.0000 | Freq: Every day | NASAL | 6 refills | Status: DC

## 2017-08-02 NOTE — Progress Notes (Signed)
Subjective:    Patient ID: Antonio Newton, male    DOB: 1946/05/11, 71 y.o.   MRN: [id number]  HPI  71 year old male who  has a past medical history of Allergy; Anxiety; Cancer [facility]); ED (erectile dysfunction); Heart murmur; History of hiatal hernia; Hypertension; Incarcerated right inguinal hernia (12/23/2016); Neuromuscular disorder ([place]); Neuropathy; OA (osteoarthritis); and Pneumonia.  He presents for follow up after being seen by another provider roughly one week ago for palpitations , intermittent leg cramps, and feeling in a fog.   Labs were checked, TSH, A1c, CBC were normal. Potassium was 3.2 - Dr.Kim had increased his K-dur from 10 meq to 20 meq.   Today in the office he reports " I am feeling better." He reports that his leg cramps have improved and he feels as though he is not " feeling in a fog"  He continues to have palpitations on occasion. He feels as though this is anxiety related. Per patient " life makes me anxious".  Review of Systems  Constitutional: Negative.   Respiratory: Negative.   Cardiovascular: Positive for palpitations.  Gastrointestinal: Negative.   Genitourinary: Negative.   Musculoskeletal: Negative.   Neurological: Negative.   All other systems reviewed and are negative.    Past Medical History:  Diagnosis Date  . Allergy   . Anxiety   . Cancer [facility])    prostate- Dr. Vickey Sages waiting"  . ED (erectile dysfunction)   . Heart murmur    pt thinks he had a murmur many years ago.  Not sure now.maw  . History of hiatal hernia    inguninal  . Hypertension   . Incarcerated right inguinal hernia 12/23/2016  . Neuromuscular disorder (HCC)    neuropathy - mildly in bil hands-is improved since neck surgery  . Neuropathy   . OA (osteoarthritis)    left hip  . Pneumonia     Social History   Social History  . Marital status: Married    Spouse name: N/A  . Number of children: 3  . Years of education: N/A   Occupational  History  . Realtor    Social History Main Topics  . Smoking status: Never Smoker  . Smokeless tobacco: Never Used  . Alcohol use 0.0 oz/week     Comment: Occasionally (few times per month)  . Drug use: No  . Sexual activity: Not on file   Other Topics Concern  . Not on file   Social History Narrative   Works as a Nature conservation officer   Lives with wife.  They have three grown children (2 daughters and 1 son).    Past Surgical History:  Procedure Laterality Date  . ANTERIOR CERVICAL DECOMP/DISCECTOMY FUSION N/A 02/06/2016   Procedure: Cevical three-four, Cervical four-five, Cervical five-six anterior cervical decompression with fusion interbody prosthesis plating and bonegraft;  Surgeon: Consuella Lose, MD;  Location: [place] [facility];  Service: Neurosurgery;  Laterality: N/A;  . COLONOSCOPY    . INGUINAL HERNIA REPAIR Right 12/23/2016   Procedure: OPEN REPAIR RIGHT INGUINAL HERNIA WITH MESH;  Surgeon: Fanny Skates, MD;  Location: [facility];  Service: General;  Laterality: Right;  . INSERTION OF MESH Right 12/23/2016   Procedure: INSERTION OF MESH;  Surgeon: Fanny Skates, MD;  Location: [facility];  Service: General;  Laterality: Right;  . None    . postate biopsy      Family History  Problem Relation Age of Onset  . Hypertension Father  Deceased, 10  . Hypertension Mother        Deceased, 64  . Healthy Sister   . Esophageal cancer Paternal Grandfather   . Colon cancer Neg Hx   . Rectal cancer Neg Hx   . Stomach cancer Neg Hx     Allergies  Allergen Reactions  . Penicillins Other (See Comments)    REACTION: family hx Has patient had a PCN reaction causing immediate rash, facial/tongue/throat swelling, SOB or lightheadedness with hypotension:No Has patient had a PCN reaction causing severe rash involving mucus membranes or skin necrosis: No Has patient had a PCN reaction that required hospitalization No Has patient had a PCN reaction occurring  within the last 10 years: No If all of the above answers are "NO", then may proceed with Cephalosporin use.     Current Outpatient Prescriptions on File Prior to Visit  Medication Sig Dispense Refill  . amLODipine (NORVASC) 10 MG tablet Take 1 tablet (10 mg total) by mouth daily. 90 tablet 4  . aspirin EC 81 MG tablet Take 81 mg by mouth daily.    Marland Kitchen gabapentin (NEURONTIN) 300 MG capsule Take 1 capsule (300 mg total) by mouth 2 (two) times daily. 180 capsule 3  . Glycerin-Hypromellose-PEG 400 (VISINE TIRED EYE RELIEF) 0.2-0.36-1 % SOLN Place 1-2 drops into both eyes 3 (three) times daily as needed (for dry/tired eyes.).    Marland Kitchen ibuprofen (ADVIL,MOTRIN) 200 MG tablet Take 400 mg by mouth 2 (two) times daily as needed (for pain relief.).    Marland Kitchen losartan-hydrochlorothiazide (HYZAAR) 100-12.5 MG tablet Take 1 tablet by mouth daily. 100 tablet 4  . Multiple Vitamin (MULTIVITAMIN WITH MINERALS) TABS tablet Take 2 tablets by mouth daily at 2 PM. ULTRA MEGA GOLD without iron    . omeprazole (PRILOSEC) 20 MG capsule Take 1 capsule (20 mg total) by mouth daily. 100 capsule 5  . potassium chloride (K-DUR) 10 MEQ tablet Take 1 tablet (10 mEq total) by mouth daily. 90 tablet 1  . sildenafil (VIAGRA) 100 MG tablet Take 1 tablet (100 mg total) by mouth daily as needed. (Patient taking differently: Take 100 mg by mouth daily as needed for erectile dysfunction. ) 10 tablet 11  . methocarbamol (ROBAXIN) 500 MG tablet Take 1 tablet (500 mg total) by mouth daily as needed for muscle spasms. (Patient not taking: Reported on 08/02/2017) 30 tablet 0  . Multiple Vitamin (CALCIUM COMPLEX PO) Take 1 capsule by mouth every evening. GNC CALCIMATE COMPLETE  VITAMIN D3 2000 UNITS-VITAMIN K2 50 MCG-CALCIUM CITRATE 800 MG- MAGNESIUM OXIDE 100 MG-ZINC OXIDE 7.5 MG-COPPER GLYCINATE 1 MG -MANGANESE GLUCONATE 1 MG- MBP 40 MG & BORON 1 MG    . Omega-3 Fatty Acids (FISH OIL TRIPLE STRENGTH) 1400 MG CAPS Take 1,400 mg by mouth daily at 2 PM.       No current facility-administered medications on file prior to visit.     BP (!) 150/88 (BP Location: Left Arm)   Temp 98.4 F (36.9 C) (Oral)   Ht 6\' 1"  (1.854 m)   Wt 186 lb (84.4 kg)   BMI 24.54 kg/m       Objective:   Physical Exam  Constitutional: He is oriented to person, place, and time. He appears well-developed and well-nourished. No distress.  Cardiovascular: Normal rate, regular rhythm, normal heart sounds and intact distal pulses.  Exam reveals no gallop and no friction rub.   No murmur heard. Pulmonary/Chest: Effort normal and breath sounds normal. No respiratory distress. He has no  wheezes. He has no rales. He exhibits no tenderness.  Neurological: He is alert and oriented to person, place, and time.  Skin: He is not diaphoretic.  Vitals reviewed.     Assessment & Plan:  1. Palpitations - Feels as though it is anxiety related and improving.  - he would like to forgo holter monitor at this time  - He will follow up in one month if palpitations   2. Hypokalemia - Basic Metabolic Panel  Dorothyann Peng, NP

## 2017-08-02 NOTE — Telephone Encounter (Signed)
Pt need new Rx for potassium chloride 20 MEQ  Pharm:  [facility]

## 2017-08-04 ENCOUNTER — Encounter: Payer: Self-pay | Admitting: Adult Health

## 2017-08-04 ENCOUNTER — Other Ambulatory Visit: Payer: Self-pay | Admitting: Adult Health

## 2017-08-04 MED ORDER — POTASSIUM CHLORIDE ER 10 MEQ PO TBCR: 10.0000 meq | EXTENDED_RELEASE_TABLET | Freq: Every day | ORAL | 2 refills | Status: DC

## 2017-08-04 NOTE — Telephone Encounter (Signed)
Called pt in regards his Rx refill for Potassium 20 MEQ, left a message for pt to return my call in the office

## 2017-08-10 NOTE — Telephone Encounter (Signed)
Spoke with pt stated that he has been taking potassium 20 MEQ and the last labs have improved.

## 2017-08-15 DIAGNOSIS — C61 Malignant neoplasm of prostate: Secondary | ICD-10-CM | POA: Diagnosis not present

## 2017-08-17 ENCOUNTER — Telehealth: Payer: Self-pay | Admitting: Family Medicine

## 2017-08-17 MED ORDER — POTASSIUM CHLORIDE CRYS ER 20 MEQ PO TBCR: 20.0000 meq | EXTENDED_RELEASE_TABLET | Freq: Every day | ORAL | 1 refills | Status: DC

## 2017-08-17 NOTE — Telephone Encounter (Signed)
° ° ° °  Pt call to say the below RX was called into the pharmacy with the incorrect dosage .  Pt said he is taking the below med 2 times a day and the RX was sent in for 1 times a day   Please resend with the correct dosage   potassium chloride (K-DUR) 10 MEQ tablet   [facility]

## 2017-08-17 NOTE — Telephone Encounter (Signed)
OK TO CHANGE

## 2017-08-17 NOTE — Telephone Encounter (Signed)
[place] for change to 20 Meq pills. Take one pill daily

## 2017-08-17 NOTE — Telephone Encounter (Signed)
Sent to the pharmacy by e-scribe. 

## 2017-08-20 ENCOUNTER — Encounter: Payer: Self-pay | Admitting: Adult Health

## 2017-08-23 ENCOUNTER — Telehealth: Payer: Self-pay | Admitting: Family Medicine

## 2017-08-23 MED ORDER — POTASSIUM CHLORIDE CRYS ER 20 MEQ PO TBCR: 20.0000 meq | EXTENDED_RELEASE_TABLET | Freq: Every day | ORAL | 0 refills | Status: DC

## 2017-08-23 NOTE — Telephone Encounter (Signed)
2 week supply sent to the pharmacy by e-scribe.  Pt notified by MyChart.

## 2017-08-23 NOTE — Telephone Encounter (Signed)
Pt is waiting on mailorder and would like 30 days potassium chloride 20 meq send to [facility]

## 2017-08-31 DIAGNOSIS — N410 Acute prostatitis: Secondary | ICD-10-CM | POA: Diagnosis not present

## 2017-08-31 DIAGNOSIS — N400 Enlarged prostate without lower urinary tract symptoms: Secondary | ICD-10-CM | POA: Diagnosis not present

## 2017-08-31 DIAGNOSIS — C61 Malignant neoplasm of prostate: Secondary | ICD-10-CM | POA: Diagnosis not present

## 2017-09-12 ENCOUNTER — Encounter: Payer: Self-pay | Admitting: Family Medicine

## 2017-09-12 ENCOUNTER — Ambulatory Visit (INDEPENDENT_AMBULATORY_CARE_PROVIDER_SITE_OTHER): Payer: Medicare Other | Admitting: Family Medicine

## 2017-09-12 VITALS — BP 132/82 | HR 68 | Temp 97.9°F | Wt 190.0 lb

## 2017-09-12 DIAGNOSIS — Z23 Encounter for immunization: Secondary | ICD-10-CM | POA: Diagnosis not present

## 2017-09-12 DIAGNOSIS — M79604 Pain in right leg: Secondary | ICD-10-CM

## 2017-09-12 DIAGNOSIS — M545 Low back pain: Secondary | ICD-10-CM

## 2017-09-12 NOTE — Patient Instructions (Signed)
We will set you up a scan of your back to try to begin a process of determining the etiology of your pain. Once we get the report I will call you and will go from there

## 2017-09-12 NOTE — Progress Notes (Signed)
Antonio Newton is a 71 year old married male nonsmoker who comes in today for evaluation of low back pain 5 months  About 5 months he began experiencing atraumatic low back pain. He describes as a dull ache initially was a 9 on a scale of 1-10. The pain radiated down his right thigh to mid calf. Symptomatic home therapy relieved most of pain however this persisted and will not go away.  He has no history of fever chills nausea vomiting diarrhea night sweats or weight loss. No bowel or bladder dysfunction. He did have a recent follow-up biopsy by his urologist. He has prostate cancer in these and the watchful waiting category. Last biopsy showed to areas of cancer. In the past over 3. He'll be followed again by watchful waiting by his urologist.  14 point review systems review is otherwise negative  BP 132/82 (BP Location: Left Arm, Patient Position: Sitting, Cuff Size: Normal)   Pulse 68   Temp 97.9 F (36.6 C) (Oral)   Wt 190 lb (86.2 kg)   BMI 25.07 kg/m  Jones well-developed well-nourished male no acute distress vital signs stable he is afebrile in the standing position the spine was normal with no palpable tenderness. In the supine position both legs were of equal length. Sensation muscle strength reflexes all normal. Straight leg raising was negative.  Impression #1...Marland KitchenMarland KitchenMarland Kitchen 5 months history of right-sided low back pain rating down to his right leg unresponsive to conservative therapy.........Marland Kitchen will begin diagnostic workup.

## 2017-09-16 ENCOUNTER — Encounter: Payer: Self-pay | Admitting: Family Medicine

## 2017-09-20 ENCOUNTER — Ambulatory Visit
Admission: RE | Admit: 2017-09-20 | Discharge: 2017-09-20 | Disposition: A | Discharge status: Home / Self Care | Payer: Medicare Other | Source: Ambulatory Visit | Attending: Family Medicine | Admitting: Family Medicine

## 2017-09-20 DIAGNOSIS — M79604 Pain in right leg: Secondary | ICD-10-CM

## 2017-09-20 DIAGNOSIS — M48061 Spinal stenosis, lumbar region without neurogenic claudication: Secondary | ICD-10-CM | POA: Diagnosis not present

## 2017-09-20 DIAGNOSIS — M545 Low back pain: Secondary | ICD-10-CM

## 2017-09-21 ENCOUNTER — Other Ambulatory Visit: Payer: Self-pay | Admitting: Family Medicine

## 2017-09-21 ENCOUNTER — Other Ambulatory Visit: Payer: Self-pay

## 2017-09-21 DIAGNOSIS — M4800 Spinal stenosis, site unspecified: Secondary | ICD-10-CM

## 2017-10-12 DIAGNOSIS — I10 Essential (primary) hypertension: Secondary | ICD-10-CM | POA: Diagnosis not present

## 2017-10-12 DIAGNOSIS — M549 Dorsalgia, unspecified: Secondary | ICD-10-CM | POA: Diagnosis not present

## 2017-10-12 DIAGNOSIS — M48062 Spinal stenosis, lumbar region with neurogenic claudication: Secondary | ICD-10-CM | POA: Diagnosis not present

## 2017-10-12 DIAGNOSIS — M4316 Spondylolisthesis, lumbar region: Secondary | ICD-10-CM | POA: Diagnosis not present

## 2017-10-24 ENCOUNTER — Encounter: Payer: Self-pay | Admitting: Physical Therapy

## 2017-10-24 ENCOUNTER — Ambulatory Visit: Payer: Medicare Other | Attending: Neurosurgery | Admitting: Physical Therapy

## 2017-10-24 DIAGNOSIS — M62830 Muscle spasm of back: Secondary | ICD-10-CM

## 2017-10-24 DIAGNOSIS — M5441 Lumbago with sciatica, right side: Secondary | ICD-10-CM | POA: Diagnosis not present

## 2017-10-24 DIAGNOSIS — M5442 Lumbago with sciatica, left side: Secondary | ICD-10-CM | POA: Insufficient documentation

## 2017-10-24 DIAGNOSIS — R262 Difficulty in walking, not elsewhere classified: Secondary | ICD-10-CM

## 2017-10-24 NOTE — Therapy (Signed)
[address] [place], [place], [address] Phone: [phone]   Fax:  [phone]  Physical Therapy Evaluation  Patient Details  Name: Antonio Newton MRN: [id number] Date of Birth: June 21, 1946 Referring Provider: Sharee Pimple  Encounter Date: 10/24/2017      PT End of Session - 10/24/17 0908    Visit Number 1   Date for PT Re-Evaluation 12/24/17   PT Start Time 0840   PT Stop Time 0936   PT Time Calculation (min) 56 min   Activity Tolerance Patient tolerated treatment well   Behavior During Therapy [facility] for tasks assessed/performed      Past Medical History:  Diagnosis Date  . Allergy   . Anxiety   . Cancer [facility])    prostate- Dr. Vickey Sages waiting"  . ED (erectile dysfunction)   . Heart murmur    pt thinks he had a murmur many years ago.  Not sure now.maw  . History of hiatal hernia    inguninal  . Hypertension   . Incarcerated right inguinal hernia 12/23/2016  . Neuromuscular disorder (HCC)    neuropathy - mildly in bil hands-is improved since neck surgery  . Neuropathy   . OA (osteoarthritis)    left hip  . Pneumonia     Past Surgical History:  Procedure Laterality Date  . ANTERIOR CERVICAL DECOMP/DISCECTOMY FUSION N/A 02/06/2016   Procedure: Cevical three-four, Cervical four-five, Cervical five-six anterior cervical decompression with fusion interbody prosthesis plating and bonegraft;  Surgeon: Consuella Lose, MD;  Location: [place] [facility];  Service: Neurosurgery;  Laterality: N/A;  . COLONOSCOPY    . INGUINAL HERNIA REPAIR Right 12/23/2016   Procedure: OPEN REPAIR RIGHT INGUINAL HERNIA WITH MESH;  Surgeon: Fanny Skates, MD;  Location: [facility];  Service: General;  Laterality: Right;  . INSERTION OF MESH Right 12/23/2016   Procedure: INSERTION OF MESH;  Surgeon: Fanny Skates, MD;  Location: [facility];  Service: General;  Laterality: Right;  . None    . postate biopsy       There were no vitals filed for this visit.       Subjective Assessment - 10/24/17 0844    Subjective Patient reports that he has had back pain for about 6 months, MRI shows severe spinal stenosis at L3-S1 with some foraminal encroachment.     Limitations Walking   Patient Stated Goals have less pain   Currently in Pain? Yes   Pain Score 3    Pain Location Back   Pain Orientation Lower   Pain Descriptors / Indicators Aching;Sore   Pain Type Acute pain   Pain Onset More than a month ago   Pain Frequency Intermittent   Aggravating Factors  walking, standing pain up to 8/10   Pain Relieving Factors reports that when he sits the pain will go a way   Effect of Pain on Daily Activities limits walking and standing            [facility] PT Assessment - 10/24/17 0001      Assessment   Medical Diagnosis severe lumbar stenosis   Referring Provider Naleesh   Onset Date/Surgical Date 09/24/17   Prior Therapy no     Precautions   Precautions None     Balance Screen   Has the patient fallen in the past 6 months No   Has the patient had a decrease in activity level because of a fear of falling?  No   Is  the patient reluctant to leave their home because of a fear of falling?  No     Home Environment   Additional Comments does housework and yardwork     Prior Function   Vocation Part time employment   Vocation Requirements real estate   Leisure was doing a versa climber but stopped due to pain     Posture/Postural Control   Posture Comments fwd head, rounded shoulders, slouched posture     ROM / Strength   AROM / PROM / Strength AROM;Strength     AROM   Overall AROM Comments Lumbar ROM decreased 75% with c/o pain and tightness in the low back  and posterior legs     Strength   Overall Strength Comments hips 4-/5, knees 4/5, ankles 4-/5     Flexibility   Soft Tissue Assessment /Muscle Length --  very tight HS and piriformis mms     Palpation   Palpation comment very  tight lumbar parapsinals and buttocks     Ambulation/Gait   Gait Comments antalgic gait today on the right, he reports that he has difficulty walking much and sometimes the pain will be on the left            Objective measurements completed on examination: See above findings.          [place] Adult PT Treatment/Exercise - 10/24/17 0001      Modalities   Modalities Traction     Traction   Type of Traction Lumbar   Min (lbs) 70   Hold Time static   Time 15                PT Education - 10/24/17 0908    Education provided Yes   Education Details Wms flexion   Person(s) Educated Patient   Methods Explanation;Demonstration;Handout   Comprehension Verbalized understanding          PT Short Term Goals - 10/24/17 0926      PT SHORT TERM GOAL #1   Title independent iwth initial HEP   Time 8   Period Weeks   Status New           PT Long Term Goals - 10/24/17 4742      PT LONG TERM GOAL #1   Title decrease pain 50%   Time 8   Period Weeks   Status New     PT LONG TERM GOAL #2   Title walk 1/2 mile without pain   Time 8   Period Weeks   Status New     PT LONG TERM GOAL #3   Title increase lumbar ROM 25%   Time 8   Period Weeks   Status New     PT LONG TERM GOAL #4   Title understand proper posture and body mechanics   Time 8   Period Weeks   Status New                Plan - 10/24/17 0909    Clinical Impression Statement Patient reports low back pain and leg pain for about 6 months.  MRI shows severe stenosis L3-S1 with nerve entrapment .  He has the most difficulty with standing and walking.  Lumbar ROM was decreased 75%,  Very tight lumbar musculature   Clinical Presentation Stable   Clinical Decision Making Low   Rehab Potential Good   PT Frequency 2x / week   PT Duration 8 weeks   PT Treatment/Interventions ADLs/Self Care Home Management;Electrical  Stimulation;Moist Heat;Traction;Gait training;Functional mobility  training;Patient/family education;Balance training;Therapeutic exercise;Therapeutic activities;Manual techniques   PT Next Visit Plan slowly progress core stabilization and flexibility, see if traction helped   Consulted and Agree with Plan of Care Patient      Patient will benefit from skilled therapeutic intervention in order to improve the following deficits and impairments:  Abnormal gait, Decreased activity tolerance, Decreased mobility, Decreased strength, Postural dysfunction, Impaired flexibility, Improper body mechanics, Pain, Increased muscle spasms, Difficulty walking, Decreased range of motion  Visit Diagnosis: Acute bilateral low back pain with bilateral sciatica - Plan: PT plan of care cert/re-cert  Muscle spasm of back - Plan: PT plan of care cert/re-cert  Difficulty in walking, not elsewhere classified - Plan: PT plan of care cert/re-cert      G-Codes - 76/73/41 0934    Functional Assessment Tool Used (Outpatient Only) foto 51% limitation   Functional Limitation Mobility: Walking and moving around   Mobility: Walking and Moving Around Current Status [phone]) At least 40 percent but less than 60 percent impaired, limited or restricted   Mobility: Walking and Moving Around Goal Status [phone]) At least 20 percent but less than 40 percent impaired, limited or restricted       Problem List Patient Active Problem List   Diagnosis Date Noted  . Incarcerated right inguinal hernia 12/23/2016  . S/P cervical spinal fusion 09/17/2016  . Prostate cancer ([place]) 01/14/2015  . Cough secondary to angiotensin converting enzyme inhibitor (ACE-I) 02/05/2014  . Low testosterone 12/06/2012  . ERECTILE DYSFUNCTION, MILD 01/17/2009  . Essential hypertension 01/17/2009  . OSTEOARTHRITIS 01/17/2009    Sumner Boast., PT 10/24/2017, 9:36 AM  [place] [address] [place], [place], [address] Phone: [phone]   Fax:   [phone]  Name: Antonio Newton MRN: [id number] Date of Birth: [birth year]

## 2017-10-27 ENCOUNTER — Encounter: Payer: Self-pay | Admitting: Physical Therapy

## 2017-10-27 ENCOUNTER — Ambulatory Visit: Payer: Medicare Other | Attending: Neurosurgery | Admitting: Physical Therapy

## 2017-10-27 DIAGNOSIS — M5441 Lumbago with sciatica, right side: Secondary | ICD-10-CM | POA: Diagnosis not present

## 2017-10-27 DIAGNOSIS — R262 Difficulty in walking, not elsewhere classified: Secondary | ICD-10-CM | POA: Diagnosis not present

## 2017-10-27 DIAGNOSIS — M62830 Muscle spasm of back: Secondary | ICD-10-CM | POA: Diagnosis not present

## 2017-10-27 DIAGNOSIS — M5442 Lumbago with sciatica, left side: Secondary | ICD-10-CM | POA: Insufficient documentation

## 2017-10-27 NOTE — Therapy (Signed)
[address] [place], [place], [address] Phone: [phone]   Fax:  [phone]  Physical Therapy Treatment  Patient Details  Name: Antonio Newton MRN: [id number] Date of Birth: 1946/02/16 Referring Provider: Sharee Pimple  Encounter Date: 10/27/2017      PT End of Session - 10/27/17 0929    Visit Number 2   Date for PT Re-Evaluation 12/24/17   PT Start Time 0845   PT Stop Time 0944   PT Time Calculation (min) 59 min   Activity Tolerance Patient tolerated treatment well   Behavior During Therapy [facility] for tasks assessed/performed      Past Medical History:  Diagnosis Date  . Allergy   . Anxiety   . Cancer [facility])    prostate- Dr. Vickey Sages waiting"  . ED (erectile dysfunction)   . Heart murmur    pt thinks he had a murmur many years ago.  Not sure now.maw  . History of hiatal hernia    inguninal  . Hypertension   . Incarcerated right inguinal hernia 12/23/2016  . Neuromuscular disorder (HCC)    neuropathy - mildly in bil hands-is improved since neck surgery  . Neuropathy   . OA (osteoarthritis)    left hip  . Pneumonia     Past Surgical History:  Procedure Laterality Date  . ANTERIOR CERVICAL DECOMP/DISCECTOMY FUSION N/A 02/06/2016   Procedure: Cevical three-four, Cervical four-five, Cervical five-six anterior cervical decompression with fusion interbody prosthesis plating and bonegraft;  Surgeon: Consuella Lose, MD;  Location: [place] NEUR[facility] ORS;  Service: Neurosurgery;  Laterality: N/A;  . COLONOSCOPY    . INGUINAL HERNIA REPAIR Right 12/23/2016   Procedure: OPEN REPAIR RIGHT INGUINAL HERNIA WITH MESH;  Surgeon: Fanny Skates, MD;  [facility];  Service: General;  Laterality: Right;  . INSERTION OF MESH Right 12/23/2016   Procedure: INSERTION OF MESH;  Surgeon: Fanny Skates, MD;  Location: [facility];  Service: General;  Laterality: Right;  . None    . postate biopsy       There were no vitals filed for this visit.      Subjective Assessment - 10/27/17 0848    Subjective No changes since evaluation. Pt reports no pain when sitting   Pain Score 5    Pain Location Back   Pain Orientation Lower                         [facility] Adult PT Treatment/Exercise - 10/27/17 0001      Exercises   Exercises Lumbar     Lumbar Exercises: Stretches   Passive Hamstring Stretch 4 reps;10 seconds   Piriformis Stretch 3 reps;10 seconds     Lumbar Exercises: Aerobic   Elliptical NuStep L4 x6      Lumbar Exercises: Standing   Row 10 reps;Both;Theraband  x2   Theraband Level (Row) Level 3 (Green)   Shoulder Extension Both;10 reps;Theraband  x2   Theraband Level (Shoulder Extension) Level 3 (Green)     Lumbar Exercises: Supine   Ab Set 10 reps;3 seconds   Bridge Limitations 2x10     Modalities   Modalities Traction     Traction   Type of Traction Lumbar   Min (lbs) 75   Hold Time static   Time 15                  PT Short Term Goals - 10/24/17 4196  PT SHORT TERM GOAL #1   Title independent iwth initial HEP   Time 8   Period Weeks   Status New           PT Long Term Goals - 10/24/17 6195      PT LONG TERM GOAL #1   Title decrease pain 50%   Time 8   Period Weeks   Status New     PT LONG TERM GOAL #2   Title walk 1/2 mile without pain   Time 8   Period Weeks   Status New     PT LONG TERM GOAL #3   Title increase lumbar ROM 25%   Time 8   Period Weeks   Status New     PT LONG TERM GOAL #4   Title understand proper posture and body mechanics   Time 8   Period Weeks   Status New               Plan - 10/27/17 0932    Clinical Impression Statement Pt tolerated and initial progression to exercises well. Bilat HS tightness noted with passive stretching. Postural cues given during standing rows and ext, pt stands with a posterior lean when standing.   Rehab Potential Good   PT Frequency 2x  / week   PT Duration 8 weeks   PT Treatment/Interventions ADLs/Self Care Home Management;Electrical Stimulation;Moist Heat;Traction;Gait training;Functional mobility training;Patient/family education;Balance training;Therapeutic exercise;Therapeutic activities;Manual techniques   PT Next Visit Plan slowly progress core stabilization and flexibility, see if traction helped      Patient will benefit from skilled therapeutic intervention in order to improve the following deficits and impairments:  Abnormal gait, Decreased activity tolerance, Decreased mobility, Decreased strength, Postural dysfunction, Impaired flexibility, Improper body mechanics, Pain, Increased muscle spasms, Difficulty walking, Decreased range of motion  Visit Diagnosis: Difficulty in walking, not elsewhere classified  Muscle spasm of back  Acute bilateral low back pain with bilateral sciatica     Problem List Patient Active Problem List   Diagnosis Date Noted  . Incarcerated right inguinal hernia 12/23/2016  . S/P cervical spinal fusion 09/17/2016  . Prostate cancer ([place]) 01/14/2015  . Cough secondary to angiotensin converting enzyme inhibitor (ACE-I) 02/05/2014  . Low testosterone 12/06/2012  . ERECTILE DYSFUNCTION, MILD 01/17/2009  . Essential hypertension 01/17/2009  . OSTEOARTHRITIS 01/17/2009    Scot Jun, PTA 10/27/2017, 9:35 AM  [address] [place], [place], [address] Phone: [phone]   Fax:  [phone]  Name: MATEUSZ NEILAN MRN: [id number] Date of Birth: 03-15-1946

## 2017-11-01 ENCOUNTER — Encounter: Payer: Self-pay | Admitting: Physical Therapy

## 2017-11-01 ENCOUNTER — Ambulatory Visit: Payer: Medicare Other | Admitting: Physical Therapy

## 2017-11-01 DIAGNOSIS — M5442 Lumbago with sciatica, left side: Secondary | ICD-10-CM

## 2017-11-01 DIAGNOSIS — R262 Difficulty in walking, not elsewhere classified: Secondary | ICD-10-CM

## 2017-11-01 DIAGNOSIS — M62830 Muscle spasm of back: Secondary | ICD-10-CM | POA: Diagnosis not present

## 2017-11-01 DIAGNOSIS — M5441 Lumbago with sciatica, right side: Secondary | ICD-10-CM | POA: Diagnosis not present

## 2017-11-01 NOTE — Therapy (Signed)
[place] [address] [place], [place], [address] Phone: [phone]   Fax:  [phone]  Physical Therapy Treatment  Patient Details  Name: Antonio Newton MRN: [id number] Date of Birth: 1946/10/16 Referring Provider: Sharee Pimple   Encounter Date: 11/01/2017  PT End of Session - 11/01/17 1012    Visit Number  3    Date for PT Re-Evaluation  12/24/17    PT Start Time  0930    PT Stop Time  1030    PT Time Calculation (min)  60 min    Activity Tolerance  Patient tolerated treatment well    Behavior During Therapy  [facility] for tasks assessed/performed       Past Medical History:  Diagnosis Date  . Allergy   . Anxiety   . Cancer [facility])    prostate- Dr. Vickey Sages waiting"  . ED (erectile dysfunction)   . Heart murmur    pt thinks he had a murmur many years ago.  Not sure now.maw  . History of hiatal hernia    inguninal  . Hypertension   . Incarcerated right inguinal hernia 12/23/2016  . Neuromuscular disorder (HCC)    neuropathy - mildly in bil hands-is improved since neck surgery  . Neuropathy   . OA (osteoarthritis)    left hip  . Pneumonia     Past Surgical History:  Procedure Laterality Date  . COLONOSCOPY    . None    . postate biopsy      There were no vitals filed for this visit.  Subjective Assessment - 11/01/17 0939    Subjective  Patient reports that he was very sore after the last treatment, reports that he is unsure of what caused it, just reports maybe the stretches    Currently in Pain?  Yes    Pain Score  6     Pain Location  Back    Pain Orientation  Lower                      [facility] Adult PT Treatment/Exercise - 11/01/17 0001      Lumbar Exercises: Stretches   Passive Hamstring Stretch  20 seconds;4 reps    Piriformis Stretch  4 reps;20 seconds      Lumbar Exercises: Machines for Strengthening   Cybex Knee Extension  5# 2x10    Cybex Knee Flexion   25# 2x10    Other Lumbar Machine Exercise  seated rows 15#, lats 20# 2x15 each    Other Lumbar Machine Exercise  calf stretches      Lumbar Exercises: Standing   Other Standing Lumbar Exercises  small hip extension x15 each side      Lumbar Exercises: Supine   Ab Set  10 reps;3 seconds    Bridge Limitations  2x10    Other Supine Lumbar Exercises  feet on ball K2C, rotation and small bridges      Modalities   Modalities  Electrical Stimulation;Moist Heat      Moist Heat Therapy   Number Minutes Moist Heat  15 Minutes    Moist Heat Location  Lumbar Spine      Electrical Stimulation   Electrical Stimulation Location  T/L area    Electrical Stimulation Action  IFC    Electrical Stimulation Parameters  supine    Electrical Stimulation Goals  Pain               PT Short Term  Goals - 11/01/17 1013      PT SHORT TERM GOAL #1   Title  independent iwth initial HEP    Status  Achieved        PT Long Term Goals - 10/24/17 0926      PT LONG TERM GOAL #1   Title  decrease pain 50%    Time  8    Period  Weeks    Status  New      PT LONG TERM GOAL #2   Title  walk 1/2 mile without pain    Time  8    Period  Weeks    Status  New      PT LONG TERM GOAL #3   Title  increase lumbar ROM 25%    Time  8    Period  Weeks    Status  New      PT LONG TERM GOAL #4   Title  understand proper posture and body mechanics    Time  8    Period  Weeks    Status  New            Plan - 11/01/17 1012    Clinical Impression Statement  Patient remains very tight in the lumbar area, his SLR was much improved today compared to evaluation.  He c/o spasms and soreness inthe paraspinals with hip extension    PT Next Visit Plan  continue to add exercises and treat pain as needed    Consulted and Agree with Plan of Care  Patient       Patient will benefit from skilled therapeutic intervention in order to improve the following deficits and impairments:  Abnormal gait, Decreased  activity tolerance, Decreased mobility, Decreased strength, Postural dysfunction, Impaired flexibility, Improper body mechanics, Pain, Increased muscle spasms, Difficulty walking, Decreased range of motion  Visit Diagnosis: Difficulty in walking, not elsewhere classified  Muscle spasm of back  Acute bilateral low back pain with bilateral sciatica     Problem List Patient Active Problem List   Diagnosis Date Noted  . Incarcerated right inguinal hernia 12/23/2016  . S/P cervical spinal fusion 09/17/2016  . Prostate cancer ([place]) 01/14/2015  . Cough secondary to angiotensin converting enzyme inhibitor (ACE-I) 02/05/2014  . Low testosterone 12/06/2012  . ERECTILE DYSFUNCTION, MILD 01/17/2009  . Essential hypertension 01/17/2009  . OSTEOARTHRITIS 01/17/2009    Sumner Boast., PT 11/01/2017, 10:14 AM  [address] [place], [place], [address] Phone: [phone]   Fax:  [phone]  Name: Antonio Newton MRN: [id number] Date of Birth: September 30, 1946

## 2017-11-03 ENCOUNTER — Encounter: Payer: Self-pay | Admitting: Physical Therapy

## 2017-11-03 ENCOUNTER — Ambulatory Visit: Payer: Medicare Other | Admitting: Physical Therapy

## 2017-11-03 DIAGNOSIS — M5441 Lumbago with sciatica, right side: Secondary | ICD-10-CM | POA: Diagnosis not present

## 2017-11-03 DIAGNOSIS — M5442 Lumbago with sciatica, left side: Secondary | ICD-10-CM | POA: Diagnosis not present

## 2017-11-03 DIAGNOSIS — M62830 Muscle spasm of back: Secondary | ICD-10-CM | POA: Diagnosis not present

## 2017-11-03 DIAGNOSIS — R262 Difficulty in walking, not elsewhere classified: Secondary | ICD-10-CM | POA: Diagnosis not present

## 2017-11-03 NOTE — Therapy (Signed)
[place] [place], [place], [address] Phone: [phone]   Fax:  [phone]  Physical Therapy Treatment  Patient Details  Name: Antonio Newton MRN: [id number] Date of Birth: 1946-04-10 Referring Provider: Sharee Pimple   Encounter Date: 11/03/2017  PT End of Session - 11/03/17 1024    Visit Number  4    Date for PT Re-Evaluation  12/24/17    PT Start Time  0930    PT Stop Time  1031    PT Time Calculation (min)  61 min    Activity Tolerance  Patient tolerated treatment well    Behavior During Therapy  [facility] for tasks assessed/performed       Past Medical History:  Diagnosis Date  . Allergy   . Anxiety   . Cancer [facility])    prostate- Dr. Vickey Sages waiting"  . ED (erectile dysfunction)   . Heart murmur    pt thinks he had a murmur many years ago.  Not sure now.maw  . History of hiatal hernia    inguninal  . Hypertension   . Incarcerated right inguinal hernia 12/23/2016  . Neuromuscular disorder (HCC)    neuropathy - mildly in bil hands-is improved since neck surgery  . Neuropathy   . OA (osteoarthritis)    left hip  . Pneumonia     Past Surgical History:  Procedure Laterality Date  . COLONOSCOPY    . None    . postate biopsy      There were no vitals filed for this visit.  Subjective Assessment - 11/03/17 0931    Subjective  Patient reports that the back is feeling better, having less pain, still some pain in the legs with walking    Currently in Pain?  Yes    Pain Score  4     Pain Location  Back    Pain Orientation  Lower    Aggravating Factors   walking         [facility] PT Assessment - 11/03/17 0001      AROM   Overall AROM Comments  Lumbar ROM decreased 50%                  [facility] Adult PT Treatment/Exercise - 11/03/17 0001      High Level Balance   High Level Balance Activities  Tandem walking    High Level Balance Comments  resisted gait all  direction      Lumbar Exercises: Stretches   Passive Hamstring Stretch  20 seconds;4 reps    Piriformis Stretch  4 reps;20 seconds      Lumbar Exercises: Aerobic   Elliptical  NuStep L4 x6       Lumbar Exercises: Machines for Strengthening   Cybex Lumbar Extension  black tband    Cybex Knee Extension  5# 2x10    Cybex Knee Flexion  25# 2x10    Other Lumbar Machine Exercise  seated rows 15#, lats 20# 2x15 each    Other Lumbar Machine Exercise  calf stretches      Lumbar Exercises: Standing   Other Standing Lumbar Exercises  weighted ball overhead lift with back to wall, this caused some pain      Lumbar Exercises: Supine   Other Supine Lumbar Exercises  feet on ball K2C, rotation and small bridges      Moist Heat Therapy   Number Minutes Moist Heat  15 Minutes    Moist Heat Location  Lumbar Spine      Electrical Stimulation   Electrical Stimulation Location  T/L area    Electrical Stimulation Action  IFC    Electrical Stimulation Parameters  supine    Electrical Stimulation Goals  Pain               PT Short Term Goals - 11/01/17 1013      PT SHORT TERM GOAL #1   Title  independent iwth initial HEP    Status  Achieved        PT Long Term Goals - 11/03/17 1031      PT LONG TERM GOAL #1   Title  decrease pain 50%    Status  On-going      PT LONG TERM GOAL #2   Title  walk 1/2 mile without pain    Status  On-going      PT LONG TERM GOAL #3   Title  increase lumbar ROM 25%    Status  Achieved            Plan - 11/03/17 1024    Clinical Impression Statement  Patient with increased ROM, reports walking better, he is still sore in the back and the things that caused the most pain today was back to wall wegihted ball overhead lift    PT Next Visit Plan  continue to add exercises and treat pain as needed    Consulted and Agree with Plan of Care  Patient       Patient will benefit from skilled therapeutic intervention in order to improve the  following deficits and impairments:  Abnormal gait, Decreased activity tolerance, Decreased mobility, Decreased strength, Postural dysfunction, Impaired flexibility, Improper body mechanics, Pain, Increased muscle spasms, Difficulty walking, Decreased range of motion  Visit Diagnosis: Difficulty in walking, not elsewhere classified  Muscle spasm of back  Acute bilateral low back pain with bilateral sciatica     Problem List Patient Active Problem List   Diagnosis Date Noted  . Incarcerated right inguinal hernia 12/23/2016  . S/P cervical spinal fusion 09/17/2016  . Prostate cancer ([place]) 01/14/2015  . Cough secondary to angiotensin converting enzyme inhibitor (ACE-I) 02/05/2014  . Low testosterone 12/06/2012  . ERECTILE DYSFUNCTION, MILD 01/17/2009  . Essential hypertension 01/17/2009  . OSTEOARTHRITIS 01/17/2009    Sumner Boast., PT 11/03/2017, 10:35 AM  [address] [address] [place], [place], [address] Phone: [phone]   Fax:  [phone]  Name: Antonio Newton MRN: [id number] Date of Birth: 01-Sep-1946

## 2017-11-08 ENCOUNTER — Ambulatory Visit: Payer: Medicare Other | Admitting: Physical Therapy

## 2017-11-08 ENCOUNTER — Encounter: Payer: Self-pay | Admitting: Physical Therapy

## 2017-11-08 DIAGNOSIS — M5441 Lumbago with sciatica, right side: Secondary | ICD-10-CM

## 2017-11-08 DIAGNOSIS — M5442 Lumbago with sciatica, left side: Secondary | ICD-10-CM | POA: Diagnosis not present

## 2017-11-08 DIAGNOSIS — M62830 Muscle spasm of back: Secondary | ICD-10-CM | POA: Diagnosis not present

## 2017-11-08 DIAGNOSIS — R262 Difficulty in walking, not elsewhere classified: Secondary | ICD-10-CM

## 2017-11-08 NOTE — Therapy (Signed)
[place] [address] [place], [place], [address] Phone: [phone]   Fax:  [phone]  Physical Therapy Treatment  Patient Details  Name: Antonio Newton MRN: [id number] Date of Birth: 12/19/1946 Referring Provider: Sharee Pimple   Encounter Date: 11/08/2017  PT End of Session - 11/08/17 1113    Visit Number  5    Date for PT Re-Evaluation  12/24/17    PT Start Time  0928    PT Stop Time  1027    PT Time Calculation (min)  59 min    Activity Tolerance  Patient tolerated treatment well    Behavior During Therapy  [facility] for tasks assessed/performed       Past Medical History:  Diagnosis Date  . Allergy   . Anxiety   . Cancer [facility])    prostate- Dr. Vickey Sages waiting"  . ED (erectile dysfunction)   . Heart murmur    pt thinks he had a murmur many years ago.  Not sure now.maw  . History of hiatal hernia    inguninal  . Hypertension   . Incarcerated right inguinal hernia 12/23/2016  . Neuromuscular disorder (HCC)    neuropathy - mildly in bil hands-is improved since neck surgery  . Neuropathy   . OA (osteoarthritis)    left hip  . Pneumonia     Past Surgical History:  Procedure Laterality Date  . COLONOSCOPY    . None    . postate biopsy      There were no vitals filed for this visit.  Subjective Assessment - 11/08/17 0932    Subjective  Reports that he had the best day yesterday that he has had in a long time, reports walking more upright    Currently in Pain?  Yes    Pain Score  4     Pain Location  Back    Pain Orientation  Lower                      [facility] Adult PT Treatment/Exercise - 11/08/17 0001      High Level Balance   High Level Balance Comments  resisted gait all direction      Lumbar Exercises: Stretches   Passive Hamstring Stretch  20 seconds;4 reps    Piriformis Stretch  4 reps;20 seconds      Lumbar Exercises: Aerobic   Elliptical  NuStep L4 x6        Lumbar Exercises: Machines for Strengthening   Cybex Lumbar Extension  black tband    Cybex Knee Extension  10# 2x10    Cybex Knee Flexion  25# 2x10    Other Lumbar Machine Exercise  seated rows 15#, lats 20# 2x15 each    Other Lumbar Machine Exercise  calf stretches      Lumbar Exercises: Standing   Other Standing Lumbar Exercises  weighted ball overhead lift with back to wall, this caused some pain, standing hip extension      Moist Heat Therapy   Number Minutes Moist Heat  15 Minutes    Moist Heat Location  Lumbar Spine      Electrical Stimulation   Electrical Stimulation Location  L/S    Electrical Stimulation Action  IFC    Electrical Stimulation Parameters  supine    Electrical Stimulation Goals  Pain               PT Short Term Goals - 11/01/17 1013  PT SHORT TERM GOAL #1   Title  independent iwth initial HEP    Status  Achieved        PT Long Term Goals - 11/03/17 1031      PT LONG TERM GOAL #1   Title  decrease pain 50%    Status  On-going      PT LONG TERM GOAL #2   Title  walk 1/2 mile without pain    Status  On-going      PT LONG TERM GOAL #3   Title  increase lumbar ROM 25%    Status  Achieved            Plan - 11/08/17 1113    Clinical Impression Statement  Continues to report improvements with his ability to stand straight and walk better, still tight and some poor gait as he gets tired he tends to stoop    PT Next Visit Plan  continue to add exercises and treat pain as needed    Consulted and Agree with Plan of Care  Patient       Patient will benefit from skilled therapeutic intervention in order to improve the following deficits and impairments:  Abnormal gait, Decreased activity tolerance, Decreased mobility, Decreased strength, Postural dysfunction, Impaired flexibility, Improper body mechanics, Pain, Increased muscle spasms, Difficulty walking, Decreased range of motion  Visit Diagnosis: Difficulty in walking, not  elsewhere classified  Muscle spasm of back  Acute bilateral low back pain with bilateral sciatica     Problem List Patient Active Problem List   Diagnosis Date Noted  . Incarcerated right inguinal hernia 12/23/2016  . S/P cervical spinal fusion 09/17/2016  . Prostate cancer ([place]) 01/14/2015  . Cough secondary to angiotensin converting enzyme inhibitor (ACE-I) 02/05/2014  . Low testosterone 12/06/2012  . ERECTILE DYSFUNCTION, MILD 01/17/2009  . Essential hypertension 01/17/2009  . OSTEOARTHRITIS 01/17/2009    Sumner Boast., PT 11/08/2017, 11:14 AM  [address] [place], [place], [address] Phone: [phone]   Fax:  [phone]  Name: Antonio Newton MRN: [id number] Date of Birth: March 16, 1946

## 2017-11-10 ENCOUNTER — Ambulatory Visit: Payer: Medicare Other | Admitting: Physical Therapy

## 2017-11-10 ENCOUNTER — Encounter: Payer: Self-pay | Admitting: Physical Therapy

## 2017-11-10 DIAGNOSIS — M62830 Muscle spasm of back: Secondary | ICD-10-CM | POA: Diagnosis not present

## 2017-11-10 DIAGNOSIS — M5442 Lumbago with sciatica, left side: Secondary | ICD-10-CM

## 2017-11-10 DIAGNOSIS — M5441 Lumbago with sciatica, right side: Secondary | ICD-10-CM | POA: Diagnosis not present

## 2017-11-10 DIAGNOSIS — R262 Difficulty in walking, not elsewhere classified: Secondary | ICD-10-CM

## 2017-11-10 NOTE — Therapy (Signed)
[place], [place], [address] Phone: [phone]   Fax:  [phone]  Physical Therapy Treatment  Patient Details  Name: Antonio Newton MRN: [id number] Date of Birth: 1946/12/06 Referring Provider: Sharee Pimple   Encounter Date: 11/10/2017  PT End of Session - 11/10/17 1022    Visit Number  6    Date for PT Re-Evaluation  12/24/17    PT Start Time  0935    PT Stop Time  1035    PT Time Calculation (min)  60 min    Activity Tolerance  Patient tolerated treatment well    Behavior During Therapy  [facility] for tasks assessed/performed       Past Medical History:  Diagnosis Date  . Allergy   . Anxiety   . Cancer [facility])    prostate- Dr. Vickey Sages waiting"  . ED (erectile dysfunction)   . Heart murmur    pt thinks he had a murmur many years ago.  Not sure now.maw  . History of hiatal hernia    inguninal  . Hypertension   . Incarcerated right inguinal hernia 12/23/2016  . Neuromuscular disorder (HCC)    neuropathy - mildly in bil hands-is improved since neck surgery  . Neuropathy   . OA (osteoarthritis)    left hip  . Pneumonia     Past Surgical History:  Procedure Laterality Date  . ANTERIOR CERVICAL DECOMP/DISCECTOMY FUSION N/A 02/06/2016   Procedure: Cevical three-four, Cervical four-five, Cervical five-six anterior cervical decompression with fusion interbody prosthesis plating and bonegraft;  Surgeon: Consuella Lose, MD;  Location: [place] NEURO ORS;  Service: Neurosurgery;  Laterality: N/A;  . COLONOSCOPY    . INGUINAL HERNIA REPAIR Right 12/23/2016   Procedure: OPEN REPAIR RIGHT INGUINAL HERNIA WITH MESH;  Surgeon: Fanny Skates, MD;  Location: [facility] ORS;  Service: General;  Laterality: Right;  . INSERTION OF MESH Right 12/23/2016   Procedure: INSERTION OF MESH;  Surgeon: Fanny Skates, MD;  Location: [facility] ORS;  Service: General;  Laterality: Right;  . None    . postate biopsy       There were no vitals filed for this visit.  Subjective Assessment - 11/10/17 0936    Subjective  "Im just so stiff"    Currently in Pain?  Yes    Pain Score  4     Pain Location  Back                      [facility] Adult PT Treatment/Exercise - 11/10/17 0001      Lumbar Exercises: Aerobic   Elliptical  NuStep L4 x6       Lumbar Exercises: Machines for Strengthening   Cybex Lumbar Extension  black tband    Cybex Knee Extension  10# 2x10    Cybex Knee Flexion  25# 2x10    Other Lumbar Machine Exercise  seated rows 20#, lats 25# 2x10 each    Other Lumbar Machine Exercise  calf stretches      Lumbar Exercises: Standing   Other Standing Lumbar Exercises  weighted ball overhead lift with back to wall, this caused some pain, standing hip extension      Moist Heat Therapy   Number Minutes Moist Heat  15 Minutes    Moist Heat Location  Lumbar Spine      Electrical Stimulation   Electrical Stimulation Location  L/S    Electrical Stimulation Action  IFC  Electrical Stimulation Parameters  supine    Electrical Stimulation Goals  Pain               PT Short Term Goals - 11/01/17 1013      PT SHORT TERM GOAL #1   Title  independent iwth initial HEP    Status  Achieved        PT Long Term Goals - 11/10/17 1024      PT LONG TERM GOAL #1   Title  decrease pain 50%    Status  On-going      PT LONG TERM GOAL #2   Title  walk 1/2 mile without pain    Status  On-going      PT LONG TERM GOAL #4   Status  On-going            Plan - 11/10/17 1022    Clinical Impression Statement  tightness and poor gait remains. Pt reports reports some pain with overt head and standing hip extensions. Reports no pain when sitting, soreness when standing.    Rehab Potential  Good    PT Treatment/Interventions  ADLs/Self Care Home Management;Electrical Stimulation;Moist Heat;Traction;Gait training;Functional mobility training;Patient/family education;Balance  training;Therapeutic exercise;Therapeutic activities;Manual techniques    PT Next Visit Plan  continue to add exercises and treat pain as needed       Patient will benefit from skilled therapeutic intervention in order to improve the following deficits and impairments:  Abnormal gait, Decreased activity tolerance, Decreased mobility, Decreased strength, Postural dysfunction, Impaired flexibility, Improper body mechanics, Pain, Increased muscle spasms, Difficulty walking, Decreased range of motion  Visit Diagnosis: Muscle spasm of back  Acute bilateral low back pain with bilateral sciatica  Difficulty in walking, not elsewhere classified     Problem List Patient Active Problem List   Diagnosis Date Noted  . Incarcerated right inguinal hernia 12/23/2016  . S/P cervical spinal fusion 09/17/2016  . Prostate cancer ([place]) 01/14/2015  . Cough secondary to angiotensin converting enzyme inhibitor (ACE-I) 02/05/2014  . Low testosterone 12/06/2012  . ERECTILE DYSFUNCTION, MILD 01/17/2009  . Essential hypertension 01/17/2009  . OSTEOARTHRITIS 01/17/2009    Scot Jun, PTA 11/10/2017, 10:25 AM  [address] [place], [place], [address] Phone: [phone]   Fax:  [phone]  Name: Antonio Newton MRN: [id number] Date of Birth: 1946-11-06

## 2017-11-15 ENCOUNTER — Encounter: Payer: Self-pay | Admitting: Physical Therapy

## 2017-11-15 ENCOUNTER — Ambulatory Visit: Payer: Medicare Other | Admitting: Physical Therapy

## 2017-11-15 DIAGNOSIS — M5441 Lumbago with sciatica, right side: Secondary | ICD-10-CM | POA: Diagnosis not present

## 2017-11-15 DIAGNOSIS — M62830 Muscle spasm of back: Secondary | ICD-10-CM | POA: Diagnosis not present

## 2017-11-15 DIAGNOSIS — R262 Difficulty in walking, not elsewhere classified: Secondary | ICD-10-CM

## 2017-11-15 DIAGNOSIS — M5442 Lumbago with sciatica, left side: Secondary | ICD-10-CM

## 2017-11-15 NOTE — Therapy (Signed)
[place], [place], [address] Phone: [phone]   Fax:  [phone]  Physical Therapy Treatment  Patient Details  Name: Antonio Newton MRN: [id number] Date of Birth: 1946-01-15 Referring Provider: Sharee Pimple   Encounter Date: 11/15/2017  PT End of Session - 11/15/17 1018    Visit Number  7    Date for PT Re-Evaluation  12/24/17    PT Start Time  0930    PT Stop Time  1034    PT Time Calculation (min)  64 min    Activity Tolerance  Patient tolerated treatment well    Behavior During Therapy  [facility] for tasks assessed/performed       Past Medical History:  Diagnosis Date  . Allergy   . Anxiety   . Cancer [facility])    prostate- Dr. Vickey Sages waiting"  . ED (erectile dysfunction)   . Heart murmur    pt thinks he had a murmur many years ago.  Not sure now.maw  . History of hiatal hernia    inguninal  . Hypertension   . Incarcerated right inguinal hernia 12/23/2016  . Neuromuscular disorder (HCC)    neuropathy - mildly in bil hands-is improved since neck surgery  . Neuropathy   . OA (osteoarthritis)    left hip  . Pneumonia     Past Surgical History:  Procedure Laterality Date  . Cevical three-four, Cervical four-five, Cervical five-six anterior cervical decompression with fusion interbody prosthesis plating and bonegraft N/A 02/06/2016   Performed by Consuella Lose, MD at [facility] NEURO ORS  . COLONOSCOPY    . INSERTION OF MESH Right 12/23/2016   Performed by Fanny Skates, MD at [facility] ORS  . None    . OPEN REPAIR RIGHT INGUINAL HERNIA WITH MESH Right 12/23/2016   Performed by Fanny Skates, MD at [facility] ORS  . postate biopsy      There were no vitals filed for this visit.  Subjective Assessment - 11/15/17 0932    Subjective  "It is slow, but it is better"    Currently in Pain?  Yes    Pain Score  4     Pain Location  -- HS and calves                       [facility] Adult PT Treatment/Exercise - 11/15/17 0001      Lumbar Exercises: Aerobic   Stationary Bike  L0 x 6 min       Lumbar Exercises: Machines for Strengthening   Cybex Knee Flexion  25# 2x15    Other Lumbar Machine Exercise  seated rows 20#, lats 25# 2x10 each    Other Lumbar Machine Exercise  calf stretches      Lumbar Exercises: Standing   Other Standing Lumbar Exercises  AR presses 15lc x10 each     Other Standing Lumbar Exercises  weighted ball overhead lift with back to wall, this caused some pain, standing hip extension      Moist Heat Therapy   Number Minutes Moist Heat  15 Minutes    Moist Heat Location  Lumbar Spine      Electrical Stimulation   Electrical Stimulation Location  L/S    Electrical Stimulation Action  IFC    Electrical Stimulation Parameters  supine    Electrical Stimulation Goals  Pain               PT Short Term  Goals - 11/01/17 1013      PT SHORT TERM GOAL #1   Title  independent iwth initial HEP    Status  Achieved        PT Long Term Goals - 11/15/17 1020      PT LONG TERM GOAL #1   Title  decrease pain 50%    Status  On-going      PT LONG TERM GOAL #2   Title  walk 1/2 mile without pain    Status  On-going            Plan - 11/15/17 1019    Clinical Impression Statement  C/O pain with over head ext and hip ext against wall. Completed all exercises well, pt does HS and calves are very tight.    Rehab Potential  Good    PT Frequency  2x / week    PT Duration  8 weeks    PT Treatment/Interventions  ADLs/Self Care Home Management;Electrical Stimulation;Moist Heat;Traction;Gait training;Functional mobility training;Patient/family education;Balance training;Therapeutic exercise;Therapeutic activities;Manual techniques    PT Next Visit Plan  continue to add exercises and treat pain as needed       Patient will benefit from skilled therapeutic intervention in order to improve the following  deficits and impairments:  Abnormal gait, Decreased activity tolerance, Decreased mobility, Decreased strength, Postural dysfunction, Impaired flexibility, Improper body mechanics, Pain, Increased muscle spasms, Difficulty walking, Decreased range of motion  Visit Diagnosis: Muscle spasm of back  Acute bilateral low back pain with bilateral sciatica  Difficulty in walking, not elsewhere classified     Problem List Patient Active Problem List   Diagnosis Date Noted  . Incarcerated right inguinal hernia 12/23/2016  . S/P cervical spinal fusion 09/17/2016  . Prostate cancer ([place]) 01/14/2015  . Cough secondary to angiotensin converting enzyme inhibitor (ACE-I) 02/05/2014  . Low testosterone 12/06/2012  . ERECTILE DYSFUNCTION, MILD 01/17/2009  . Essential hypertension 01/17/2009  . OSTEOARTHRITIS 01/17/2009    Scot Jun , PTA 11/15/2017, 10:20 AM  [place] [address] [place], [place], [address] Phone: [phone]   Fax:  [phone]  Name: Antonio Newton MRN: [id number] Date of Birth: 1946-09-11

## 2017-11-23 ENCOUNTER — Ambulatory Visit: Payer: Medicare Other | Admitting: Physical Therapy

## 2017-11-23 ENCOUNTER — Encounter: Payer: Self-pay | Admitting: Physical Therapy

## 2017-11-23 DIAGNOSIS — R262 Difficulty in walking, not elsewhere classified: Secondary | ICD-10-CM | POA: Diagnosis not present

## 2017-11-23 DIAGNOSIS — M5442 Lumbago with sciatica, left side: Secondary | ICD-10-CM

## 2017-11-23 DIAGNOSIS — M5441 Lumbago with sciatica, right side: Secondary | ICD-10-CM | POA: Diagnosis not present

## 2017-11-23 DIAGNOSIS — M62830 Muscle spasm of back: Secondary | ICD-10-CM

## 2017-11-23 NOTE — Therapy (Signed)
[address] [address] [place], [place], [address] Phone: [phone]   Fax:  [phone]  Physical Therapy Treatment  Patient Details  Name: Antonio Newton MRN: [id number] Date of Birth: 26-Jul-1946 Referring Provider: Sharee Pimple   Encounter Date: 11/23/2017  PT End of Session - 11/23/17 0934    Visit Number  8    Date for PT Re-Evaluation  12/24/17    PT Start Time  0851    PT Stop Time  0945    PT Time Calculation (min)  54 min    Activity Tolerance  Patient tolerated treatment well    Behavior During Therapy  [facility] for tasks assessed/performed       Past Medical History:  Diagnosis Date  . Allergy   . Anxiety   . Cancer [facility])    prostate- Dr. Vickey Sages waiting"  . ED (erectile dysfunction)   . Heart murmur    pt thinks he had a murmur many years ago.  Not sure now.maw  . History of hiatal hernia    inguninal  . Hypertension   . Incarcerated right inguinal hernia 12/23/2016  . Neuromuscular disorder (HCC)    neuropathy - mildly in bil hands-is improved since neck surgery  . Neuropathy   . OA (osteoarthritis)    left hip  . Pneumonia     Past Surgical History:  Procedure Laterality Date  . ANTERIOR CERVICAL DECOMP/DISCECTOMY FUSION N/A 02/06/2016   Procedure: Cevical three-four, Cervical four-five, Cervical five-six anterior cervical decompression with fusion interbody prosthesis plating and bonegraft;  Surgeon: Consuella Lose, MD;  Location: [place] NEURO ORS;  Service: Neurosurgery;  Laterality: N/A;  . COLONOSCOPY    . INGUINAL HERNIA REPAIR Right 12/23/2016   Procedure: OPEN REPAIR RIGHT INGUINAL HERNIA WITH MESH;  Surgeon: Fanny Skates, MD;  Location: [facility] ORS;  Service: General;  Laterality: Right;  . INSERTION OF MESH Right 12/23/2016   Procedure: INSERTION OF MESH;  Surgeon: Fanny Skates, MD;  Location: [facility] ORS;  Service: General;  Laterality: Right;  . None    . postate biopsy       There were no vitals filed for this visit.  Subjective Assessment - 11/23/17 0854    Subjective  "Its early but Im stiff this morning, Yesterday was my best day in the last 6 months"    Currently in Pain?  Yes    Pain Score  3     Pain Location  Leg    Pain Orientation  Right;Left                      [facility] Adult PT Treatment/Exercise - 11/23/17 0001      Lumbar Exercises: Aerobic   Elliptical  NuStep L5 x6       Lumbar Exercises: Machines for Strengthening   Cybex Lumbar Extension  black tband    Cybex Knee Extension  10# 2x10    Cybex Knee Flexion  25# 2x15    Other Lumbar Machine Exercise  seated rows 25#, lats 25# 2x10 each    Other Lumbar Machine Exercise  calf stretches      Lumbar Exercises: Standing   Other Standing Lumbar Exercises  AR presses 15lb x10 each     Other Standing Lumbar Exercises  weighted ball overhead lift with back to wall, this caused some pain, standing hip extension      Moist Heat Therapy   Number Minutes Moist Heat  15 Minutes  Moist Heat Location  Lumbar Spine      Electrical Stimulation   Electrical Stimulation Location  L/S    Electrical Stimulation Action  IFC    Electrical Stimulation Parameters  supine    Electrical Stimulation Goals  Pain               PT Short Term Goals - 11/01/17 1013      PT SHORT TERM GOAL #1   Title  independent iwth initial HEP    Status  Achieved        PT Long Term Goals - 11/15/17 1020      PT LONG TERM GOAL #1   Title  decrease pain 50%    Status  On-going      PT LONG TERM GOAL #2   Title  walk 1/2 mile without pain    Status  On-going            Plan - 11/23/17 0935    Clinical Impression Statement  Pt reports some pain in HS and calves. Lumbar ext movements seems to be most difficult for pt.     PT Treatment/Interventions  ADLs/Self Care Home Management;Electrical Stimulation;Moist Heat;Traction;Gait training;Functional mobility training;Patient/family  education;Balance training;Therapeutic exercise;Therapeutic activities;Manual techniques    PT Next Visit Plan  continue to add exercises and treat pain as needed       Patient will benefit from skilled therapeutic intervention in order to improve the following deficits and impairments:  Abnormal gait, Decreased activity tolerance, Decreased mobility, Decreased strength, Postural dysfunction, Impaired flexibility, Improper body mechanics, Pain, Increased muscle spasms, Difficulty walking, Decreased range of motion  Visit Diagnosis: Muscle spasm of back  Acute bilateral low back pain with bilateral sciatica  Difficulty in walking, not elsewhere classified     Problem List Patient Active Problem List   Diagnosis Date Noted  . Incarcerated right inguinal hernia 12/23/2016  . S/P cervical spinal fusion 09/17/2016  . Prostate cancer ([place]) 01/14/2015  . Cough secondary to angiotensin converting enzyme inhibitor (ACE-I) 02/05/2014  . Low testosterone 12/06/2012  . ERECTILE DYSFUNCTION, MILD 01/17/2009  . Essential hypertension 01/17/2009  . OSTEOARTHRITIS 01/17/2009    Scot Jun 11/23/2017, 9:37 AM  [employer] [address] [place], [place], [address] Phone: [phone]   Fax:  [phone]  Name: Antonio Newton MRN: [id number] Date of Birth: Nov 17, 1946

## 2017-11-29 ENCOUNTER — Ambulatory Visit: Payer: Medicare Other | Admitting: Physical Therapy

## 2017-12-01 ENCOUNTER — Encounter: Payer: Self-pay | Admitting: Physical Therapy

## 2017-12-01 ENCOUNTER — Ambulatory Visit: Payer: Medicare Other | Attending: Neurosurgery | Admitting: Physical Therapy

## 2017-12-01 DIAGNOSIS — M62830 Muscle spasm of back: Secondary | ICD-10-CM

## 2017-12-01 DIAGNOSIS — R262 Difficulty in walking, not elsewhere classified: Secondary | ICD-10-CM | POA: Diagnosis not present

## 2017-12-01 DIAGNOSIS — M5442 Lumbago with sciatica, left side: Secondary | ICD-10-CM | POA: Diagnosis not present

## 2017-12-01 DIAGNOSIS — M5441 Lumbago with sciatica, right side: Secondary | ICD-10-CM

## 2017-12-01 NOTE — Therapy (Signed)
[place] [place], [place], [address] Phone: [phone]   Fax:  [phone]  Physical Therapy Treatment  Patient Details  Name: Antonio Newton MRN: [id number] Date of Birth: 04/19/1946 Referring Provider: Sharee Pimple   Encounter Date: 12/01/2017  PT End of Session - 12/01/17 0929    Visit Number  9    Date for PT Re-Evaluation  12/24/17    PT Start Time  0845    PT Stop Time  0943    PT Time Calculation (min)  58 min       Past Medical History:  Diagnosis Date  . Allergy   . Anxiety   . Cancer [facility])    prostate- Dr. Vickey Sages waiting"  . ED (erectile dysfunction)   . Heart murmur    pt thinks he had a murmur many years ago.  Not sure now.maw  . History of hiatal hernia    inguninal  . Hypertension   . Incarcerated right inguinal hernia 12/23/2016  . Neuromuscular disorder (HCC)    neuropathy - mildly in bil hands-is improved since neck surgery  . Neuropathy   . OA (osteoarthritis)    left hip  . Pneumonia     Past Surgical History:  Procedure Laterality Date  . ANTERIOR CERVICAL DECOMP/DISCECTOMY FUSION N/A 02/06/2016   Procedure: Cevical three-four, Cervical four-five, Cervical five-six anterior cervical decompression with fusion interbody prosthesis plating and bonegraft;  Surgeon: Consuella Lose, MD;  Location: [place] [facility];  Service: Neurosurgery;  Laterality: N/A;  . COLONOSCOPY    . INGUINAL HERNIA REPAIR Right 12/23/2016   Procedure: OPEN REPAIR RIGHT INGUINAL HERNIA WITH MESH;  Surgeon: Fanny Skates, MD;  Location: [facility];  Service: General;  Laterality: Right;  . INSERTION OF MESH Right 12/23/2016   Procedure: INSERTION OF MESH;  Surgeon: Fanny Skates, MD;  Location: [facility];  Service: General;  Laterality: Right;  . None    . postate biopsy      There were no vitals filed for this visit.  Subjective Assessment - 12/01/17 0845    Subjective  "I can see  improvement , lord knows I can see improvement" Pt reports that he has been mowing and doing yard work     Currently in Pain?  No/denies    Pain Score  0-No pain                      [facility] Adult PT Treatment/Exercise - 12/01/17 0001      Lumbar Exercises: Stretches   Passive Hamstring Stretch  20 seconds;4 reps    Single Knee to Chest Stretch  2 reps;10 seconds    Piriformis Stretch  4 reps;20 seconds      Lumbar Exercises: Machines for Strengthening   Cybex Knee Extension  10# 2x10    Cybex Knee Flexion  35# 2x10    Other Lumbar Machine Exercise  seated rows 35#, lats 25# 2x10 each    Other Lumbar Machine Exercise  calf stretches      Lumbar Exercises: Standing   Other Standing Lumbar Exercises  AR presses 15lb x10 each     Other Standing Lumbar Exercises  standing hip extension      Moist Heat Therapy   Number Minutes Moist Heat  15 Minutes    Moist Heat Location  Lumbar Spine      Electrical Stimulation   Electrical Stimulation Location  L/S    Electrical Stimulation  Action  IFC    Electrical Stimulation Parameters  supine    Electrical Stimulation Goals  Pain               PT Short Term Goals - 11/01/17 1013      PT SHORT TERM GOAL #1   Title  independent iwth initial HEP    Status  Achieved        PT Long Term Goals - 11/15/17 1020      PT LONG TERM GOAL #1   Title  decrease pain 50%    Status  On-going      PT LONG TERM GOAL #2   Title  walk 1/2 mile without pain    Status  On-going            Plan - 12/01/17 0931    Clinical Impression Statement  Increase weight tolerated with machine level interventions. Some pain reported with standing hip extension. Bilat tight HS and calves noted.     Rehab Potential  Good    PT Frequency  2x / week    PT Duration  8 weeks    PT Treatment/Interventions  ADLs/Self Care Home Management;Electrical Stimulation;Moist Heat;Traction;Gait training;Functional mobility training;Patient/family  education;Balance training;Therapeutic exercise;Therapeutic activities;Manual techniques    PT Next Visit Plan  continue to add exercises and treat pain as needed       Patient will benefit from skilled therapeutic intervention in order to improve the following deficits and impairments:  Abnormal gait, Decreased activity tolerance, Decreased mobility, Decreased strength, Postural dysfunction, Impaired flexibility, Improper body mechanics, Pain, Increased muscle spasms, Difficulty walking, Decreased range of motion  Visit Diagnosis: Muscle spasm of back  Acute bilateral low back pain with bilateral sciatica  Difficulty in walking, not elsewhere classified     Problem List Patient Active Problem List   Diagnosis Date Noted  . Incarcerated right inguinal hernia 12/23/2016  . S/P cervical spinal fusion 09/17/2016  . Prostate cancer ([facility]) 01/14/2015  . Cough secondary to angiotensin converting enzyme inhibitor (ACE-I) 02/05/2014  . Low testosterone 12/06/2012  . ERECTILE DYSFUNCTION, MILD 01/17/2009  . Essential hypertension 01/17/2009  . OSTEOARTHRITIS 01/17/2009    Scot Jun, PTA 12/01/2017, 9:32 AM  [address] [place], [place], [address] Phone: [phone]   Fax:  [phone]  Name: ADARRIUS GRAEFF MRN: [id number] Date of Birth: Apr 26, 1946

## 2017-12-05 ENCOUNTER — Ambulatory Visit: Payer: Medicare Other | Admitting: Physical Therapy

## 2017-12-08 ENCOUNTER — Ambulatory Visit: Payer: Medicare Other | Admitting: Physical Therapy

## 2017-12-13 ENCOUNTER — Ambulatory Visit: Payer: Medicare Other | Admitting: Physical Therapy

## 2017-12-13 ENCOUNTER — Encounter: Payer: Self-pay | Admitting: Physical Therapy

## 2017-12-13 DIAGNOSIS — M62830 Muscle spasm of back: Secondary | ICD-10-CM

## 2017-12-13 DIAGNOSIS — M5441 Lumbago with sciatica, right side: Secondary | ICD-10-CM | POA: Diagnosis not present

## 2017-12-13 DIAGNOSIS — M5442 Lumbago with sciatica, left side: Secondary | ICD-10-CM | POA: Diagnosis not present

## 2017-12-13 DIAGNOSIS — R262 Difficulty in walking, not elsewhere classified: Secondary | ICD-10-CM

## 2017-12-13 NOTE — Therapy (Addendum)
[place] [place], [place], [address] Phone: [phone]   Fax:  [phone]  Physical Therapy Treatment  Patient Details  Name: Antonio Newton MRN: [id number] Date of Birth: July 14, 1946 Referring Provider: Sharee Pimple   Encounter Date: 12/13/2017  PT End of Session - 12/13/17 1020    Visit Number  10    Date for PT Re-Evaluation  12/24/17    PT Start Time  0925    PT Stop Time  1035    PT Time Calculation (min)  70 min    Activity Tolerance  Patient tolerated treatment well    Behavior During Therapy  [facility] for tasks assessed/performed       Past Medical History:  Diagnosis Date  . Allergy   . Anxiety   . Cancer [facility])    prostate- Dr. Vickey Sages waiting"  . ED (erectile dysfunction)   . Heart murmur    pt thinks he had a murmur many years ago.  Not sure now.maw  . History of hiatal hernia    inguninal  . Hypertension   . Incarcerated right inguinal hernia 12/23/2016  . Neuromuscular disorder (HCC)    neuropathy - mildly in bil hands-is improved since neck surgery  . Neuropathy   . OA (osteoarthritis)    left hip  . Pneumonia     Past Surgical History:  Procedure Laterality Date  . ANTERIOR CERVICAL DECOMP/DISCECTOMY FUSION N/A 02/06/2016   Procedure: Cevical three-four, Cervical four-five, Cervical five-six anterior cervical decompression with fusion interbody prosthesis plating and bonegraft;  Surgeon: Consuella Lose, MD;  Location: [place] [facility];  Service: Neurosurgery;  Laterality: N/A;  . COLONOSCOPY    . INGUINAL HERNIA REPAIR Right 12/23/2016   Procedure: OPEN REPAIR RIGHT INGUINAL HERNIA WITH MESH;  Surgeon: Fanny Skates, MD;  [facility];  Service: General;  Laterality: Right;  . INSERTION OF MESH Right 12/23/2016   Procedure: INSERTION OF MESH;  Surgeon: Fanny Skates, MD;  Location: [facility];  Service: General;  Laterality: Right;  . None    . postate biopsy       There were no vitals filed for this visit.  Subjective Assessment - 12/13/17 0926    Subjective  "Im aching this morning, I woke up yesterday aching but not this bad"    Currently in Pain?  Yes    Pain Score  4     Pain Location  Knee & Low back                      [facility] Adult PT Treatment/Exercise - 12/13/17 0001      Lumbar Exercises: Stretches   Passive Hamstring Stretch  20 seconds;4 reps    Single Knee to Chest Stretch  2 reps;10 seconds    Piriformis Stretch  4 reps;20 seconds      Lumbar Exercises: Aerobic   Elliptical  NuStep L5 x6     Tread Mill  Ellipitcal I5 R 3 x3 min      Lumbar Exercises: Machines for Strengthening   Cybex Knee Extension  10# 2x10    Cybex Knee Flexion  35# 2x15    Other Lumbar Machine Exercise  seated rows 35#, lats 25# 2x10 each    Other Lumbar Machine Exercise  calf stretches      Lumbar Exercises: Standing   Other Standing Lumbar Exercises  AR presses 15lb x10 each; 10lb ext 2x10    Other Standing  Lumbar Exercises  standing hip extension      Moist Heat Therapy   Number Minutes Moist Heat  15 Minutes    Moist Heat Location  Lumbar Spine      Electrical Stimulation   Electrical Stimulation Location  L/S    Electrical Stimulation Action  IFC    Electrical Stimulation Parameters  supine    Electrical Stimulation Goals  Pain               PT Short Term Goals - 11/01/17 1013      PT SHORT TERM GOAL #1   Title  independent iwth initial HEP    Status  Achieved        PT Long Term Goals - 12/13/17 0958      PT LONG TERM GOAL #1   Title  decrease pain 50%    Status  Partially Met      PT LONG TERM GOAL #2   Title  walk 1/2 mile without pain    Status  On-going      PT LONG TERM GOAL #3   Title  increase lumbar ROM 25%    Status  Achieved      PT LONG TERM GOAL #4   Title  understand proper posture and body mechanics    Status  Achieved            Plan - 12/13/17 1023    Clinical  Impression Statement  Improvement overall, pt reports he was able to shovel snow with out issue. Difficulty with anti rotational presses and back extensions.     Rehab Potential  Good    PT Frequency  2x / week    PT Duration  8 weeks    PT Treatment/Interventions  ADLs/Self Care Home Management;Electrical Stimulation;Moist Heat;Traction;Gait training;Functional mobility training;Patient/family education;Balance training;Therapeutic exercise;Therapeutic activities;Manual techniques    PT Next Visit Plan  continue to add exercises and treat pain as needed       Patient will benefit from skilled therapeutic intervention in order to improve the following deficits and impairments:  Abnormal gait, Decreased activity tolerance, Decreased mobility, Decreased strength, Postural dysfunction, Impaired flexibility, Improper body mechanics, Pain, Increased muscle spasms, Difficulty walking, Decreased range of motion  Visit Diagnosis: Acute bilateral low back pain with bilateral sciatica  Muscle spasm of back  Difficulty in walking, not elsewhere classified     Problem List Patient Active Problem List   Diagnosis Date Noted  . Incarcerated right inguinal hernia 12/23/2016  . S/P cervical spinal fusion 09/17/2016  . Prostate cancer ([place]) 01/14/2015  . Cough secondary to angiotensin converting enzyme inhibitor (ACE-I) 02/05/2014  . Low testosterone 12/06/2012  . ERECTILE DYSFUNCTION, MILD 01/17/2009  . Essential hypertension 01/17/2009  . OSTEOARTHRITIS 01/17/2009   PHYSICAL THERAPY DISCHARGE SUMMARY  Visits from Start of Care: 10 Plan: Patient agrees to discharge.  Patient goals were partially met. Patient is being discharged due to being pleased with the current functional level.  ?????    Scot Jun, PTA 12/13/2017, 10:24 AM  [address] [place], [place], [address] Phone: [phone]   Fax:   [phone]  Name: Antonio Newton MRN: [id number] Date of Birth: 03/04/1946

## 2017-12-14 DIAGNOSIS — M48062 Spinal stenosis, lumbar region with neurogenic claudication: Secondary | ICD-10-CM | POA: Diagnosis not present

## 2017-12-14 DIAGNOSIS — I10 Essential (primary) hypertension: Secondary | ICD-10-CM | POA: Diagnosis not present

## 2017-12-19 ENCOUNTER — Other Ambulatory Visit: Payer: Self-pay | Admitting: Adult Health

## 2017-12-22 NOTE — Telephone Encounter (Signed)
Dr. Todd patient  

## 2018-01-09 ENCOUNTER — Other Ambulatory Visit: Payer: Self-pay | Admitting: Family Medicine

## 2018-02-04 ENCOUNTER — Telehealth: Payer: Self-pay | Admitting: Family Medicine

## 2018-02-04 MED ORDER — OSELTAMIVIR PHOSPHATE 75 MG PO CAPS: 75.0000 mg | ORAL_CAPSULE | Freq: Every day | ORAL | 0 refills | Status: DC

## 2018-02-04 NOTE — Telephone Encounter (Signed)
I saw patient's wife in office this am; has the flu; due to patients age and comorbid conditions, I recommend preventive tamiflu and have ordered it.

## 2018-02-20 ENCOUNTER — Encounter: Payer: Self-pay | Admitting: Family Medicine

## 2018-02-20 ENCOUNTER — Telehealth: Payer: Self-pay | Admitting: Family Medicine

## 2018-02-20 ENCOUNTER — Ambulatory Visit: Payer: Medicare Other | Admitting: Family Medicine

## 2018-02-20 VITALS — BP 140/78 | HR 78 | Temp 98.0°F | Wt 187.0 lb

## 2018-02-20 DIAGNOSIS — H9190 Unspecified hearing loss, unspecified ear: Secondary | ICD-10-CM | POA: Insufficient documentation

## 2018-02-20 DIAGNOSIS — R351 Nocturia: Secondary | ICD-10-CM | POA: Diagnosis not present

## 2018-02-20 DIAGNOSIS — I10 Essential (primary) hypertension: Secondary | ICD-10-CM

## 2018-02-20 DIAGNOSIS — H918X3 Other specified hearing loss, bilateral: Secondary | ICD-10-CM

## 2018-02-20 DIAGNOSIS — N401 Enlarged prostate with lower urinary tract symptoms: Secondary | ICD-10-CM | POA: Diagnosis not present

## 2018-02-20 LAB — POCT URINALYSIS DIPSTICK
Bilirubin, UA: NEGATIVE
GLUCOSE UA: NEGATIVE
KETONES UA: NEGATIVE
Leukocytes, UA: NEGATIVE
NITRITE UA: NEGATIVE
PROTEIN UA: NEGATIVE
RBC UA: NEGATIVE
SPEC GRAV UA: 1.015 (ref 1.010–1.025)
Urobilinogen, UA: 0.2 E.U./dL
pH, UA: 7 (ref 5.0–8.0)

## 2018-02-20 MED ORDER — NEOMYCIN-POLYMYXIN-HC 3.5-10000-1 OT SOLN: 1.0000 [drp] | Freq: Four times a day (QID) | OTIC | 2 refills | Status: DC

## 2018-02-20 NOTE — Telephone Encounter (Signed)
Copied from [place] #[id number]. Topic: Quick Communication - See Telephone Encounter >> Feb 20, 2018  5:12 PM Arletha Grippe wrote: CRM for notification. See Telephone encounter for:   [facility] rx neomycin-polymyxin-hydrocortisone (CORTISPORIN) OTIC solution. Pt is asking for it to be [facility] [address]  Cb for pt is [phone]

## 2018-02-20 NOTE — Progress Notes (Signed)
Antonio Newton is a 72 year old married male nonsmoker comes in today for evaluation of bilateral hearing loss  He's had problems with earwax in the past  BP 140/78 (BP Location: Left Arm, Patient Position: Sitting, Cuff Size: Normal)   Pulse 78   Temp 98 F (36.7 C) (Oral)   Wt 187 lb (84.8 kg)   BMI 24.67 kg/m  Well-developed well-nourished male no acute distress vital signs stable he is afebrile examination HEENT pertinent his bilateral cerumen impactions    We used a combination of irrigation and suction to remove the earwax  #1 bilateral cerumen impactions removed with irrigation and suction...................Marland Kitchen

## 2018-02-20 NOTE — Patient Instructions (Signed)
Because of the irritation of removing all the earwax in one to do the following.......... one drop of the antibiotic ear drops in each ear at bedtime for 1 week

## 2018-02-21 ENCOUNTER — Other Ambulatory Visit: Payer: Self-pay

## 2018-02-21 LAB — LIPID PANEL
CHOLESTEROL: 173 mg/dL (ref 0–200)
HDL: 39.6 mg/dL (ref >39.00)
LDL Cholesterol: 104 mg/dL — ABNORMAL HIGH (ref 0–99)
NonHDL: 133.37
Total CHOL/HDL Ratio: 4
Triglycerides: 148 mg/dL (ref 0.0–149.0)
VLDL: 29.6 mg/dL (ref 0.0–40.0)

## 2018-02-21 LAB — HEPATIC FUNCTION PANEL
ALK PHOS: 73 U/L (ref 39–117)
ALT: 16 U/L (ref 0–53)
AST: 15 U/L (ref 0–37)
Albumin: 4.3 g/dL (ref 3.5–5.2)
BILIRUBIN TOTAL: 0.8 mg/dL (ref 0.2–1.2)
Bilirubin, Direct: 0.2 mg/dL (ref 0.0–0.3)
Total Protein: 7.2 g/dL (ref 6.0–8.3)

## 2018-02-21 LAB — CBC WITH DIFFERENTIAL/PLATELET
BASOS PCT: 0.5 % (ref 0.0–3.0)
Basophils Absolute: 0 10*3/uL (ref 0.0–0.1)
EOS PCT: 4 % (ref 0.0–5.0)
Eosinophils Absolute: 0.2 10*3/uL (ref 0.0–0.7)
HCT: 39.9 % (ref 39.0–52.0)
Hemoglobin: 14 g/dL (ref 13.0–17.0)
LYMPHS ABS: 1.1 10*3/uL (ref 0.7–4.0)
Lymphocytes Relative: 19.7 % (ref 12.0–46.0)
MCHC: 35 g/dL (ref 30.0–36.0)
MCV: 87.3 fl (ref 78.0–100.0)
MONO ABS: 0.4 10*3/uL (ref 0.1–1.0)
Monocytes Relative: 6.8 % (ref 3.0–12.0)
NEUTROS ABS: 3.8 10*3/uL (ref 1.4–7.7)
NEUTROS PCT: 69 % (ref 43.0–77.0)
PLATELETS: 183 10*3/uL (ref 150.0–400.0)
RBC: 4.57 Mil/uL (ref 4.22–5.81)
RDW: 12.9 % (ref 11.5–15.5)
WBC: 5.5 10*3/uL (ref 4.0–10.5)

## 2018-02-21 LAB — BASIC METABOLIC PANEL
BUN: 23 mg/dL (ref 6–23)
CALCIUM: 9.7 mg/dL (ref 8.4–10.5)
CO2: 33 mEq/L — ABNORMAL HIGH (ref 19–32)
CREATININE: 1.08 mg/dL (ref 0.40–1.50)
Chloride: 99 mEq/L (ref 96–112)
GFR: 86.63 mL/min (ref >60.00)
Glucose, Bld: 94 mg/dL (ref 70–99)
Potassium: 3.6 mEq/L (ref 3.5–5.1)
Sodium: 140 mEq/L (ref 135–145)

## 2018-02-21 LAB — PSA: PSA: 7.78 ng/mL — AB (ref 0.10–4.00)

## 2018-02-21 LAB — TSH: TSH: 0.55 u[IU]/mL (ref 0.35–4.50)

## 2018-02-21 MED ORDER — NEOMYCIN-POLYMYXIN-HC 3.5-10000-1 OT SOLN: 1.0000 [drp] | Freq: Four times a day (QID) | OTIC | 2 refills | Status: DC

## 2018-02-21 NOTE — Telephone Encounter (Signed)
Called [facility] and verified that they did not get rx RX resent to [place]

## 2018-02-27 ENCOUNTER — Ambulatory Visit (INDEPENDENT_AMBULATORY_CARE_PROVIDER_SITE_OTHER): Payer: Medicare Other | Admitting: Family Medicine

## 2018-02-27 ENCOUNTER — Encounter: Payer: Self-pay | Admitting: Family Medicine

## 2018-02-27 VITALS — BP 140/80 | HR 60 | Temp 97.9°F | Ht 73.0 in | Wt 186.0 lb

## 2018-02-27 DIAGNOSIS — I10 Essential (primary) hypertension: Secondary | ICD-10-CM | POA: Diagnosis not present

## 2018-02-27 DIAGNOSIS — Z0000 Encounter for general adult medical examination without abnormal findings: Secondary | ICD-10-CM

## 2018-02-27 DIAGNOSIS — C61 Malignant neoplasm of prostate: Secondary | ICD-10-CM

## 2018-02-27 MED ORDER — POTASSIUM CHLORIDE CRYS ER 20 MEQ PO TBCR: 20.0000 meq | EXTENDED_RELEASE_TABLET | Freq: Every day | ORAL | 4 refills | Status: DC

## 2018-02-27 MED ORDER — FLUTICASONE PROPIONATE 50 MCG/ACT NA SUSP: 2.0000 | Freq: Every day | NASAL | 6 refills | Status: DC

## 2018-02-27 MED ORDER — OMEPRAZOLE 20 MG PO CPDR: 20.0000 mg | DELAYED_RELEASE_CAPSULE | Freq: Every day | ORAL | 5 refills | Status: DC

## 2018-02-27 MED ORDER — LOSARTAN POTASSIUM-HCTZ 100-12.5 MG PO TABS: 1.0000 | ORAL_TABLET | Freq: Every day | ORAL | 4 refills | Status: DC

## 2018-02-27 MED ORDER — AMLODIPINE BESYLATE 10 MG PO TABS: 10.0000 mg | ORAL_TABLET | Freq: Every day | ORAL | 4 refills | Status: DC

## 2018-02-27 NOTE — Patient Instructions (Signed)
Continue your current medications  Continue diet and exercise program  Call your insurance company to find out where you can get the new shingles vaccine. We will send a copy of today's note to Dr. Alinda Money.

## 2018-02-27 NOTE — Progress Notes (Signed)
Antonio Newton is a 72 year old married male nonsmoker comes in today for annual physical examination because of a history of hypertension, chronic back pain spinal stenosis, prostate cancer mild ED  He has a history of hypertension he is on Norvasc 10 mg daily along with Hyzaar 100-12 0.5. BP 140/80. We discussed the issue with the losartan. He would like to switch to another product.  He uses Neurontin 300 mg twice daily because of a history of low back pain. He has spinal stenosis. He sees the neurologist on a regular basis. He's also been seen and evaluated by neurosurgery.  He has a history of prostate cancer. Been followed by Dr. Pearline Cables P every 6 months. Dr. Bunnie Philips has referred him to Dr. Alinda Money. He has an appointment Dr. Alinda Money later on this spring. He's had no treatment for his prostate cancer today. We've done watchful waiting. His father died at age 77 of prostate cancer.  He had a right inguinal hernia repair in 2017 and a cervical disc repair 2016.  He also takes omeprazole 20 mg daily for chronic reflux and potassium supplement.  He uses generic Viagra from the urologist for ED.  We saw him last week because of hearing loss. He had bilateral cerumen impactions. His hearing is back to normal since removed all the ear wax  Cognitive function normal he walks daily home health safety reviewed no issues identified, no guns in the house, he does have a healthcare power of attorney and living will  EKG was done because of a history of hypertension. EKG was normal and unchanged  Vaccinations up-to-date. Information given on the new shingles vaccine.  BP 140/80 (BP Location: Left Arm, Patient Position: Sitting, Cuff Size: Normal)   Pulse 60   Temp 97.9 F (36.6 C) (Oral)   Ht 6\' 1"  (1.854 m)   Wt 186 lb (84.4 kg)   BMI 24.54 kg/m  Well-developed well-nourished male no acute distress vital signs stable he is afebrile HEENT were negative except for scar right side of the neck. After cervical  disc surgery had some keloids.  No carotid bruits  Cardiopulmonary exam normal. Abdominal exam normal. Genitalia done by urologist therefore not repeated. Rectum by urologist therefore not repeated. Extremities normal skin normal peripheral pulses normal except for scar keloid right lower quadrant where he had his hernia repaired 3 years ago.  #1 hypertension..... Continue Norvasc ....... continue the losartan until we get further information.  #2 chronic low back pain....... spinal stenosis........ continue Neurontin follow-up by neurosurgery and neurology  #3 reflux esophagitis.......... continue Prilosec  Number for prostate cancer......... followed by Dr. Alinda Money on transfer from Dr. Darnell Level.  #5 mild ED

## 2018-03-14 ENCOUNTER — Other Ambulatory Visit: Payer: Self-pay | Admitting: Family Medicine

## 2018-03-22 DIAGNOSIS — C61 Malignant neoplasm of prostate: Secondary | ICD-10-CM | POA: Diagnosis not present

## 2018-03-29 DIAGNOSIS — C61 Malignant neoplasm of prostate: Secondary | ICD-10-CM | POA: Diagnosis not present

## 2018-04-28 ENCOUNTER — Encounter: Payer: Self-pay | Admitting: Family Medicine

## 2018-05-08 DIAGNOSIS — M48062 Spinal stenosis, lumbar region with neurogenic claudication: Secondary | ICD-10-CM | POA: Diagnosis not present

## 2018-05-08 DIAGNOSIS — I10 Essential (primary) hypertension: Secondary | ICD-10-CM | POA: Diagnosis not present

## 2018-05-09 ENCOUNTER — Encounter: Payer: Self-pay | Admitting: Family Medicine

## 2018-05-09 ENCOUNTER — Ambulatory Visit: Payer: Medicare Other | Admitting: Family Medicine

## 2018-05-09 VITALS — BP 152/90 | HR 65 | Temp 98.0°F | Wt 185.0 lb

## 2018-05-09 DIAGNOSIS — J301 Allergic rhinitis due to pollen: Secondary | ICD-10-CM | POA: Diagnosis not present

## 2018-05-09 MED ORDER — FLUTICASONE PROPIONATE 50 MCG/ACT NA SUSP: NASAL | 11 refills | Status: DC

## 2018-05-09 MED ORDER — LORATADINE 10 MG PO TABS: 10.0000 mg | ORAL_TABLET | Freq: Every day | ORAL | 4 refills | Status: DC

## 2018-05-09 NOTE — Progress Notes (Signed)
Antonio Newton is a 72 year old married male nonsmoker who comes in today for evaluation of allergic rhinitis  He has a long-standing history of allergic rhinitis it's seasonal. Spring is usually his worse season. He's been taking OTC Claritin 10 mg plain with good success. He states his insurance company can get him the Claritin cheaper if it prescription is written  Vs BP (!) 152/90 (BP Location: Right Arm, Patient Position: Sitting, Cuff Size: Normal)   Pulse 65   Temp 98 F (36.7 C) (Oral)   Wt 185 lb (83.9 kg)   BMI 24.41 kg/m  Well-developed well-nourished male no acute distress vital signs stable he is afebrile except for slight elevation of blood pressure. He rushed in here today.  HEENT negative except for marked lateral nasal edema  #1 allergic rhinitis.......Marland Kitchen plain 10 mg Claritin plus steroid nasal spray one shot up each nostril twice a day.

## 2018-05-09 NOTE — Patient Instructions (Signed)
Claritin 10 mg plain............Marland Kitchen 1 daily in the morning  Steroid nasal spray.......Marland Kitchen 1 shot up each nostril twice daily

## 2018-05-23 DIAGNOSIS — M48062 Spinal stenosis, lumbar region with neurogenic claudication: Secondary | ICD-10-CM | POA: Diagnosis not present

## 2018-05-25 ENCOUNTER — Telehealth: Payer: Self-pay | Admitting: Family Medicine

## 2018-05-25 MED ORDER — LORATADINE 10 MG PO TABS: 10.0000 mg | ORAL_TABLET | Freq: Every day | ORAL | 4 refills | Status: DC

## 2018-05-25 NOTE — Telephone Encounter (Signed)
Claritin sent to [facility] as requested by the pt due to having a better price than his local pharmacy

## 2018-05-25 NOTE — Telephone Encounter (Signed)
Copied from [place] [id number]. Topic: Quick Communication - See Telephone Encounter >> May 25, 2018 10:13 AM Hewitt Shorts wrote: Pt called stating that his rx for claratin 10mg  generic brand was sent to the wrong pharmacy on 05/09/18 it should have gone to [facility] mail order so that he can get his 90 day supply  At a better price than his local pharmacy   Best number is [phone]

## 2018-06-27 ENCOUNTER — Other Ambulatory Visit: Payer: Self-pay | Admitting: *Deleted

## 2018-06-27 MED ORDER — LEVOCETIRIZINE DIHYDROCHLORIDE 5 MG PO TABS: 5.0000 mg | ORAL_TABLET | Freq: Every evening | ORAL | 1 refills | Status: DC

## 2018-08-01 ENCOUNTER — Other Ambulatory Visit: Payer: Self-pay | Admitting: Family Medicine

## 2018-08-01 MED ORDER — FLUTICASONE PROPIONATE 50 MCG/ACT NA SUSP: NASAL | 11 refills | Status: DC

## 2018-08-01 NOTE — Telephone Encounter (Signed)
LOV 05/09/18 Dr. Sherren Mocha. Last refill 05/09/18 Pt. Requests Flonase sent to [facility] instead of [facility].

## 2018-08-01 NOTE — Telephone Encounter (Signed)
Copied from [place] [id number]. Topic: Quick Communication - Rx Refill/Question >> Aug 01, 2018 10:25 AM Alfredia Ferguson R wrote: Medication: fluticasone (FLONASE) 50 MCG/ACT nasal spray  Has the patient contacted their pharmacy? Yes  Preferred Pharmacy (with phone number or street name): [place], [place] [employer] [phone] (Phone) [phone] (Fax)

## 2018-08-30 ENCOUNTER — Telehealth: Payer: Self-pay | Admitting: Family Medicine

## 2018-08-30 MED ORDER — LOSARTAN POTASSIUM-HCTZ 100-12.5 MG PO TABS: 1.0000 | ORAL_TABLET | Freq: Every day | ORAL | 0 refills | Status: DC

## 2018-08-30 NOTE — Telephone Encounter (Signed)
Pt needs a 30 day supply of Losartan/HCTZ sent to local [facility], he is waiting on a mail order from [facility], it is on back order right now.

## 2018-08-30 NOTE — Telephone Encounter (Signed)
Copied from [place] [phone]. Topic: Quick Communication - Rx Refill/Question >> Aug 30, 2018 10:05 AM Neva Seat wrote: Medication: ***  Has the patient contacted their pharmacy?  (Agent: If no, request that the patient contact the pharmacy for the refill.) (Agent: If yes, when and what did the pharmacy advise?)  Preferred Pharmacy (with phone number or street name): ***  Agent: Please be advised that RX refills may take up to 3 business days. We ask that you follow-up with your pharmacy.

## 2018-08-30 NOTE — Telephone Encounter (Signed)
Rx done. 

## 2018-10-09 NOTE — Telephone Encounter (Signed)
Patient is calling regarding losartan-hydrochlorothiazide (HYZAAR) 100-12.5 MG tablet [[id number]]  It is still on back order with the mail order pharmacy.  Requesting Dr. Honor Junes advise does he need a 60 day or 30 day script to be written   [place] [place].  [address]. [place] [address]  Phone: [phone] Fax: [phone]   Please advise [phone]

## 2018-10-09 NOTE — Telephone Encounter (Signed)
Routed incorrectly  °

## 2018-10-10 NOTE — Telephone Encounter (Signed)
Please advise  Okay for refill?

## 2018-10-10 NOTE — Telephone Encounter (Signed)
Pt last OV for hypertension was 04/2018.   Okay for refill?  Please advise

## 2018-10-11 ENCOUNTER — Other Ambulatory Visit: Payer: Self-pay | Admitting: Family Medicine

## 2018-10-11 MED ORDER — LOSARTAN POTASSIUM-HCTZ 100-12.5 MG PO TABS: 1.0000 | ORAL_TABLET | Freq: Every day | ORAL | 4 refills | Status: DC

## 2018-10-11 MED ORDER — LOSARTAN POTASSIUM-HCTZ 100-12.5 MG PO TABS: 1.0000 | ORAL_TABLET | Freq: Every day | ORAL | 3 refills | Status: DC

## 2018-10-11 NOTE — Telephone Encounter (Signed)
Copied from [place] [phone]. Topic: Quick Communication - Rx Refill/Question >> Oct 11, 2018  1:06 PM Burchel, Abbi R wrote: Medication:  losartan-hydrochlorothiazide (HYZAAR) 100-12.5 MG tablet   Pt states his regular pharmacy ([facility]) is unable to fill this rx because it is on back order.  He is requesting that it be sent to:   [place], [place]. [address]. [place] [address] Phone: [phone] Fax: [phone]  Pt states he has requested this before and there was some confusion, and his request was not completed.  Please call pt when completed: [phone].

## 2018-10-11 NOTE — Telephone Encounter (Signed)
rx sent

## 2018-11-03 LAB — PSA: PSA: 6.52

## 2018-11-06 ENCOUNTER — Other Ambulatory Visit: Payer: Self-pay | Admitting: Family Medicine

## 2018-11-07 IMAGING — MR MR LUMBAR SPINE W/O CM
5 series · 48 of 48 positions shown · non-contrast
Comparison: Lumbar radiographs 12/26/2007

CLINICAL DATA: Low back pain with bilateral hip pain

EXAM:
MRI LUMBAR SPINE WITHOUT CONTRAST
TECHNIQUE: Multiplanar, multisequence MR imaging of the lumbar spine was
performed. No intravenous contrast was administered.

[Series 4: T2 · sagittal · 4.0mm · 0.91mm/px · 5 of 13 slices shown (1 of 2)]
[im 1/13]
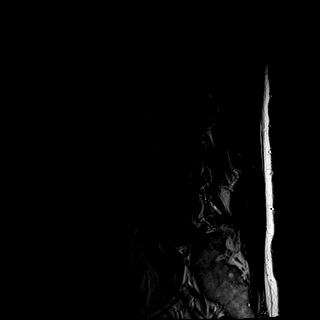
[im 4/13]
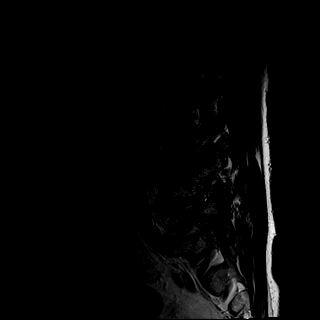
[im 7/13]
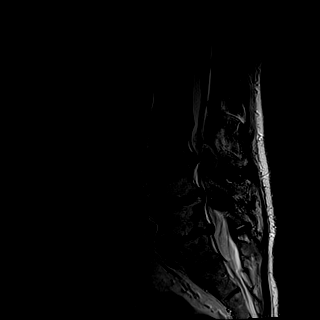
[im 10/13]
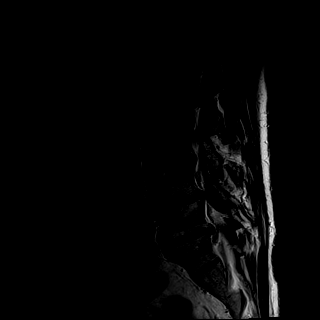
[im 13/13]
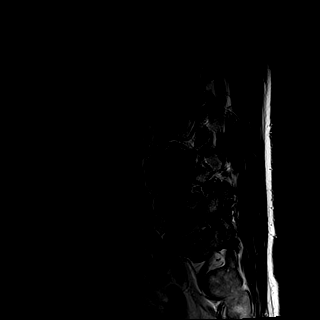

[Series 5: T1 · sagittal · 4.0mm · 0.91mm/px · 5 of 13 slices shown (1 of 2)]
[im 1/13]
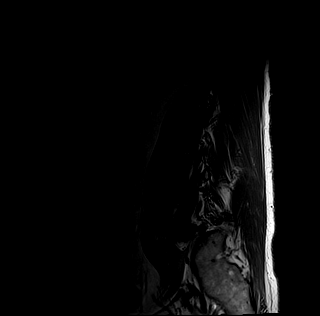
[im 4/13]
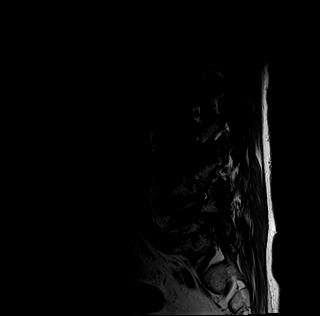
[im 7/13]
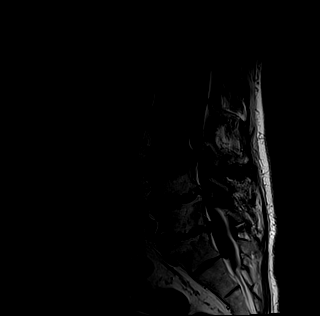
[im 10/13]
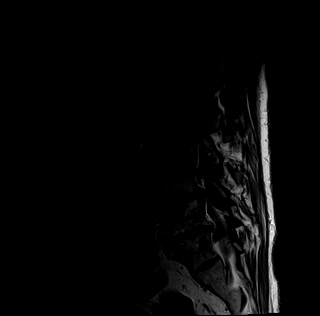
[im 13/13]
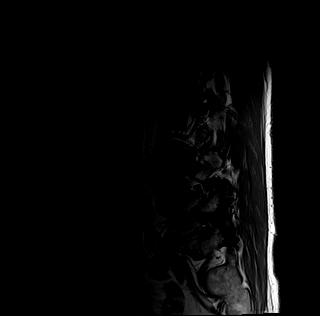

[Series 7: tirm sag repeat · sagittal · 4.0mm · 0.57mm/px · 4 of 13 slices shown]
[im 1/13]
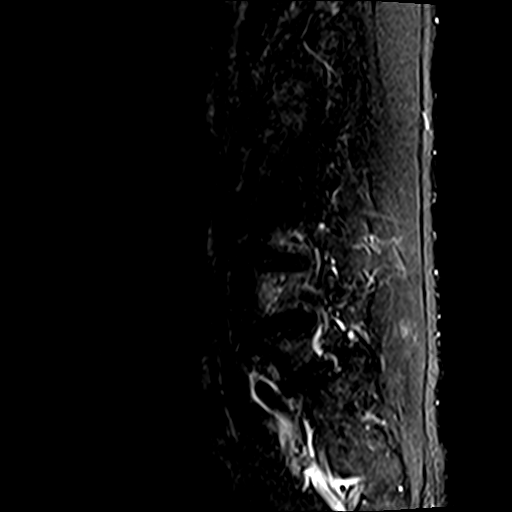
[im 5/13]
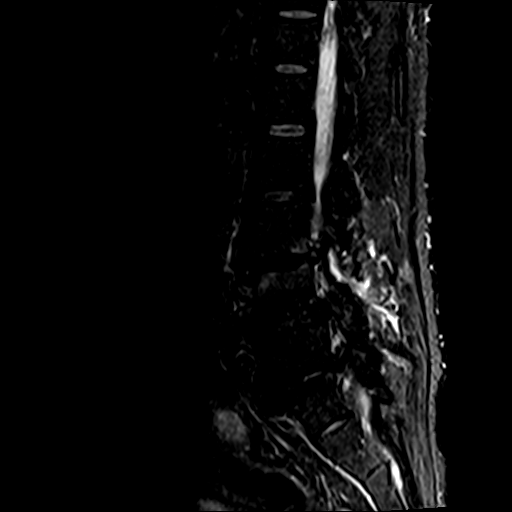
[im 9/13]
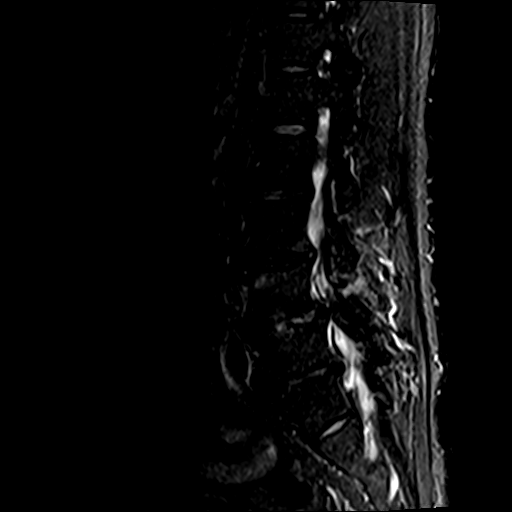
[im 13/13]
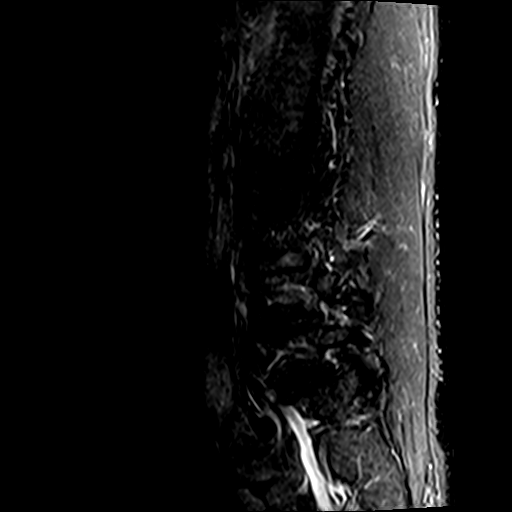

[Series 8: T1 · axial · 4.0mm · 0.78mm/px · z∈[-31,+231]mm · 17 of 51 slices shown (2 of 2)]
[im 1/51]
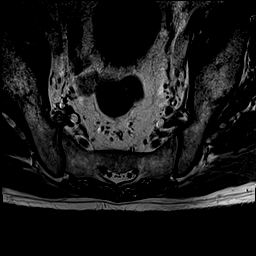
[im 4/51]
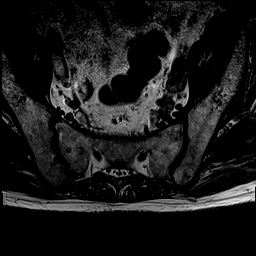
[im 7/51]
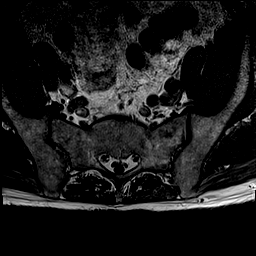
[im 10/51]
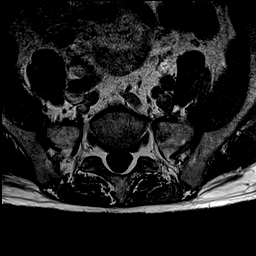
[im 13/51]
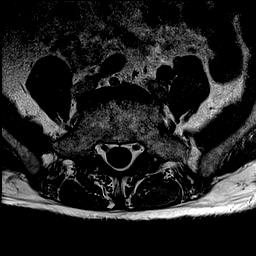
[im 16/51]
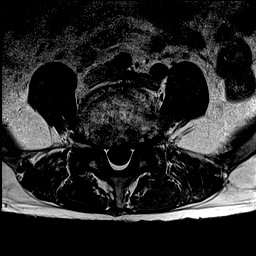
[im 19/51]
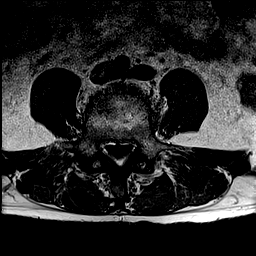
[im 22/51]
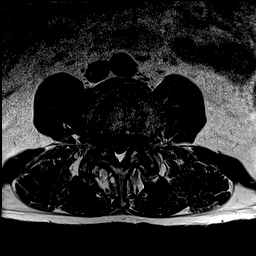
[im 26/51]
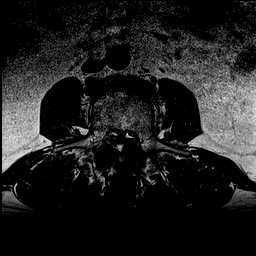
[im 29/51]
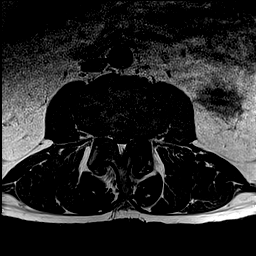
[im 32/51]
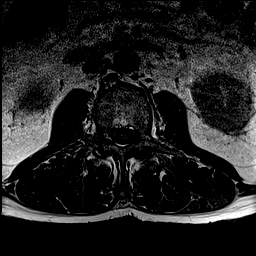
[im 35/51]
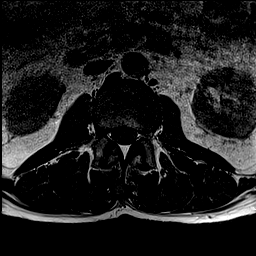
[im 38/51]
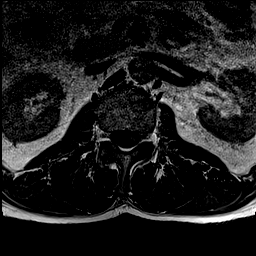
[im 41/51]
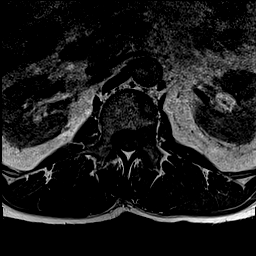
[im 44/51]
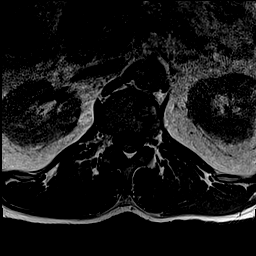
[im 47/51]
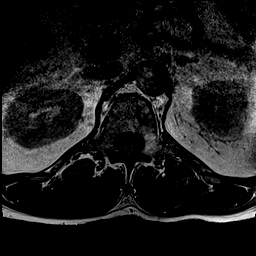
[im 51/51]
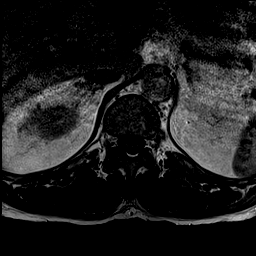

[Series 9: T2 · axial · 4.0mm · 0.78mm/px · z∈[-31,+231]mm · 17 of 51 slices shown (2 of 2)]
[im 1/51]
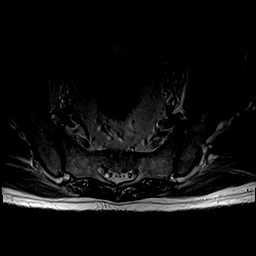
[im 4/51]
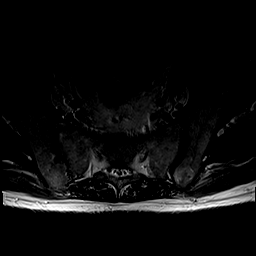
[im 7/51]
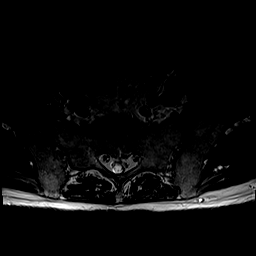
[im 10/51]
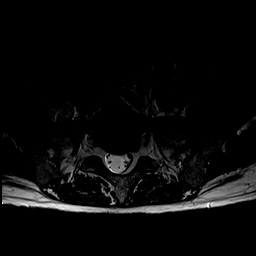
[im 13/51]
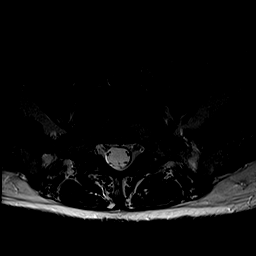
[im 16/51]
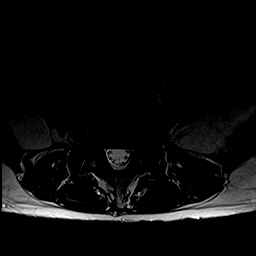
[im 19/51]
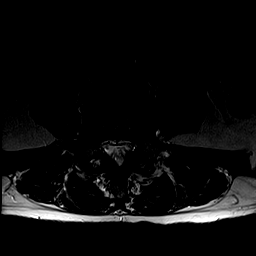
[im 22/51]
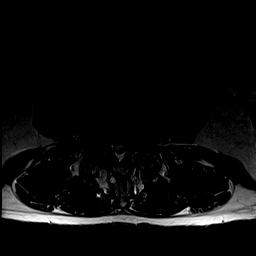
[im 26/51]
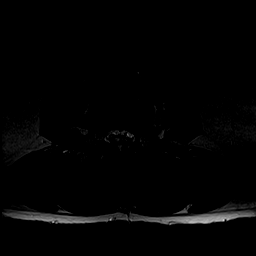
[im 29/51]
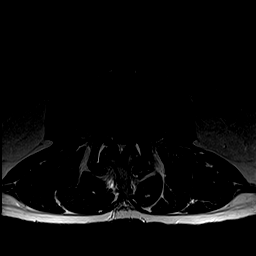
[im 32/51]
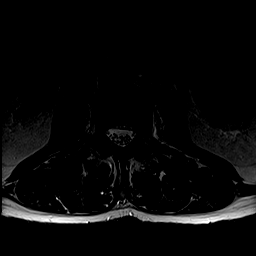
[im 35/51]
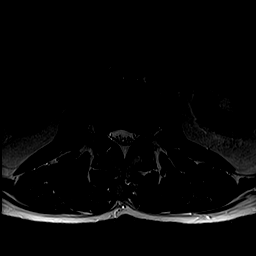
[im 38/51]
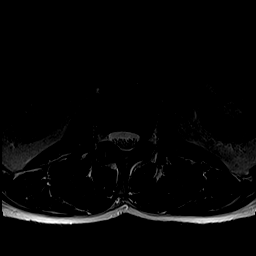
[im 41/51]
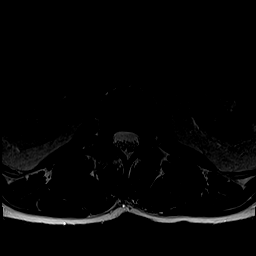
[im 44/51]
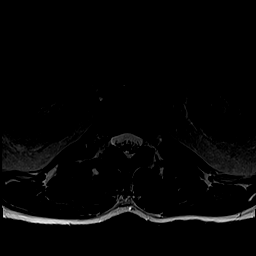
[im 47/51]
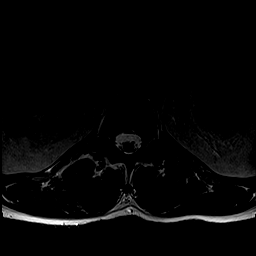
[im 51/51]
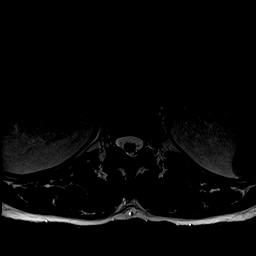

[48 of 48 positions shown; findings below may reference images not displayed]

FINDINGS: Segmentation:  Normal

Alignment:  5 mm anterolisthesis L4-5.  Mild retrolisthesis L5-S1

Vertebrae:  Negative for fracture or mass.

Conus medullaris: Extends to the L1-2 level and appears normal.

Paraspinal and other soft tissues: Negative

Disc levels:

L1-2:  Negative

L2-3: Mild disc bulging and mild facet degeneration. Mild narrowing
of the canal.

L3-4: Progression of disc degeneration. Diffuse disc bulging and
spurring. Bilateral facet and ligamentum flavum hypertrophy. Severe
spinal stenosis. Subarticular and foraminal encroachment left
greater than right.

L4-5: Marked progression of disc degeneration 5 mm anterolisthesis.
Disc bulging and spurring. Moderate facet degeneration. Severe
spinal stenosis. Moderate subarticular and foraminal stenosis
bilaterally

L5-S1: Disc degeneration and diffuse endplate spurring. Mild facet
degeneration. Severe right foraminal encroachment and moderate left
foraminal encroachment.
IMPRESSION: Severe spinal stenosis L3-4 with subarticular foraminal stenosis
left greater than right

Severe spinal stenosis L4-5 with moderate subarticular and foraminal
stenosis bilaterally

Severe right foraminal encroachment and moderate left foraminal
encroachment L5-S1.

## 2018-11-14 DIAGNOSIS — M48062 Spinal stenosis, lumbar region with neurogenic claudication: Secondary | ICD-10-CM | POA: Diagnosis not present

## 2018-11-17 ENCOUNTER — Ambulatory Visit (INDEPENDENT_AMBULATORY_CARE_PROVIDER_SITE_OTHER): Payer: Medicare Other | Admitting: *Deleted

## 2018-11-17 DIAGNOSIS — Z23 Encounter for immunization: Secondary | ICD-10-CM | POA: Diagnosis not present

## 2018-11-20 ENCOUNTER — Encounter: Payer: Self-pay | Admitting: Family Medicine

## 2018-12-19 ENCOUNTER — Telehealth: Payer: Self-pay | Admitting: Family Medicine

## 2018-12-19 ENCOUNTER — Ambulatory Visit: Payer: Self-pay | Admitting: *Deleted

## 2018-12-19 NOTE — Telephone Encounter (Signed)
Contacted pt regarding request for tamiflu; he was previously seen by Dr Sherren Mocha (last CPE 02/27/18); the pt states that he and his wife have been exposed to the flu; their grandchildren are visiting and one has been diagnosed with influenza B; the pt would like tamiflu; his wife is a high risk pt seen be Dr Maudie Mercury; she has already requested tamiflu also; nurse triage initiated  recommendations made per nurse triage protocol; explained to office that the office closed at 1300 due to the holiday, but I would try to contact office; he verbalizes understanding; he uses [place], [place]; the pt can be contacted at [phone] (cell), and [phone] (home); unable to contact flow coordinator via phone [phone] and [phone]; will route to office for final disposition.  Reason for Disposition . [1] Influenza EXPOSURE within last 48 hours (2 days) AND [2] NOT HIGH RISK AND [3] strongly requests antiviral medication  Answer Assessment - Initial Assessment Questions 1. TYPE of EXPOSURE: "How were you exposed?" (e.g., close contact, not a close contact)     Grandchildren diagnosed with influenza 2. DATE of EXPOSURE: "When did the exposure occur?" (e.g., hour, days, weeks)     12/18/18 3. PREGNANCY: "Is there any chance you are pregnant?" "When was your last menstrual period?"     n/a 4. HIGH RISK for COMPLICATIONS: "Do you have any heart or lung problems? Do you have a weakened immune system?" (e.g., CHF, COPD, asthma, HIV positive, chemotherapy, renal failure, diabetes mellitus, sickle cell anemia)     HTN 5. SYMPTOMS: "Do you have any symptoms?" (e.g., cough, fever, sore throat, difficulty breathing).     no  Protocols used: INFLUENZA EXPOSURE-A-AH

## 2018-12-19 NOTE — Telephone Encounter (Signed)
Pt request tamilflu due to flu exposure; see nurse triage note dated 12/19/18 at 1318.

## 2018-12-25 NOTE — Telephone Encounter (Signed)
Spoke with patient and Rx for Tamiflu is not needed at this time.  Patient will check with his insurance company and call back to schedule a transfer of care appointment.

## 2019-01-25 ENCOUNTER — Other Ambulatory Visit: Payer: Self-pay | Admitting: *Deleted

## 2019-01-25 MED ORDER — OMEPRAZOLE 20 MG PO CPDR: 20.0000 mg | DELAYED_RELEASE_CAPSULE | Freq: Every day | ORAL | 1 refills | Status: DC

## 2019-03-06 ENCOUNTER — Ambulatory Visit (INDEPENDENT_AMBULATORY_CARE_PROVIDER_SITE_OTHER): Payer: Medicare Other | Admitting: Family Medicine

## 2019-03-06 ENCOUNTER — Encounter: Payer: Self-pay | Admitting: Family Medicine

## 2019-03-06 VITALS — BP 120/70 | HR 77 | Temp 98.6°F | Ht 70.5 in | Wt 195.5 lb

## 2019-03-06 DIAGNOSIS — I10 Essential (primary) hypertension: Secondary | ICD-10-CM | POA: Diagnosis not present

## 2019-03-06 DIAGNOSIS — K219 Gastro-esophageal reflux disease without esophagitis: Secondary | ICD-10-CM | POA: Diagnosis not present

## 2019-03-06 DIAGNOSIS — M1990 Unspecified osteoarthritis, unspecified site: Secondary | ICD-10-CM | POA: Diagnosis not present

## 2019-03-06 LAB — BASIC METABOLIC PANEL
BUN: 26 mg/dL — ABNORMAL HIGH (ref 6–23)
CHLORIDE: 104 meq/L (ref 96–112)
CO2: 29 mEq/L (ref 19–32)
Calcium: 9 mg/dL (ref 8.4–10.5)
Creatinine, Ser: 1.03 mg/dL (ref 0.40–1.50)
GFR: 85.84 mL/min (ref >60.00)
Glucose, Bld: 99 mg/dL (ref 70–99)
Potassium: 3.5 mEq/L (ref 3.5–5.1)
Sodium: 141 mEq/L (ref 135–145)

## 2019-03-06 LAB — CBC
HEMATOCRIT: 39.2 % (ref 39.0–52.0)
Hemoglobin: 13.5 g/dL (ref 13.0–17.0)
MCHC: 34.6 g/dL (ref 30.0–36.0)
MCV: 85.8 fl (ref 78.0–100.0)
Platelets: 181 10*3/uL (ref 150.0–400.0)
RBC: 4.56 Mil/uL (ref 4.22–5.81)
RDW: 12.9 % (ref 11.5–15.5)
WBC: 4.8 10*3/uL (ref 4.0–10.5)

## 2019-03-06 NOTE — Progress Notes (Signed)
HPI:  Using dictation device. Unfortunately this device frequently misinterprets words/phrases.  Here for a med check: -was going to establish care, has questions about providers taking new patients -used to see Dr. Sherren Mocha -reports is good on medications right now -sees urologist for prostate issues and sees neurologist as well -wonders if he can stop the PPI - reports never had bad GERD issues, ulcers or strictures and he no longer has symptoms -exercises, tries to eat well -no CP, SOB, DOE or other concerns today -sees back specialist for DDD/spinal stenosis - reports these issues have been much better since surgery, has keloid from prior surgery and plans to call his specialist about it as gets caught on belt line at times  ROS: See pertinent positives and negatives per HPI.  Past Medical History:  Diagnosis Date  . Allergy   . Anxiety   . Cancer [facility])    prostate- Dr. Vickey Sages waiting"  . ED (erectile dysfunction)   . Heart murmur    pt thinks he had a murmur many years ago.  Not sure now.maw  . History of hiatal hernia    inguninal  . Hypertension   . Incarcerated right inguinal hernia 12/23/2016  . Neuromuscular disorder (HCC)    neuropathy - mildly in bil hands-is improved since neck surgery  . Neuropathy   . OA (osteoarthritis)    left hip  . Pneumonia     Past Surgical History:  Procedure Laterality Date  . ANTERIOR CERVICAL DECOMP/DISCECTOMY FUSION N/A 02/06/2016   Procedure: Cevical three-four, Cervical four-five, Cervical five-six anterior cervical decompression with fusion interbody prosthesis plating and bonegraft;  Surgeon: Consuella Lose, MD;  Location: [facility];  Service: Neurosurgery;  Laterality: N/A;  . COLONOSCOPY    . INGUINAL HERNIA REPAIR Right 12/23/2016   Procedure: OPEN REPAIR RIGHT INGUINAL HERNIA WITH MESH;  Surgeon: Fanny Skates, MD;  Location: [facility];  Service: General;  Laterality: Right;  . INSERTION OF MESH Right  12/23/2016   Procedure: INSERTION OF MESH;  Surgeon: Fanny Skates, MD;  Location: [facility];  Service: General;  Laterality: Right;  . None    . postate biopsy      Family History  Problem Relation Age of Onset  . Hypertension Father        Deceased, 69  . Hypertension Mother        Deceased, 73  . Healthy Sister   . Esophageal cancer Paternal Grandfather   . Colon cancer Neg Hx   . Rectal cancer Neg Hx   . Stomach cancer Neg Hx     SOCIAL HX: se ehpi   Current Outpatient Medications:  .  amLODipine (NORVASC) 10 MG tablet, Take 1 tablet (10 mg total) by mouth daily., Disp: 90 tablet, Rfl: 4 .  aspirin EC 81 MG tablet, Take 81 mg by mouth daily., Disp: , Rfl:  .  fluticasone (FLONASE) 50 MCG/ACT nasal spray, Place 2 sprays into both nostrils daily., Disp: 16 g, Rfl: 6 .  gabapentin (NEURONTIN) 300 MG capsule, TAKE 1 CAPSULE BY MOUTH TWO TIMES DAILY, Disp: 180 capsule, Rfl: 3 .  Glycerin-Hypromellose-PEG 400 (VISINE TIRED EYE RELIEF) 0.2-0.36-1 % SOLN, Place 1-2 drops into both eyes 3 (three) times daily as needed (for dry/tired eyes.)., Disp: , Rfl:  .  ibuprofen (ADVIL,MOTRIN) 200 MG tablet, Take 400 mg by mouth 2 (two) times daily as needed (for pain relief.)., Disp: , Rfl:  .  levocetirizine (XYZAL) 5 MG tablet, TAKE 1 TABLET BY MOUTH  EVERY EVENING, Disp: 90 tablet, Rfl: 1 .  losartan-hydrochlorothiazide (HYZAAR) 100-12.5 MG tablet, Take 1 tablet by mouth daily., Disp: 90 tablet, Rfl: 3 .  Multiple Vitamin (CALCIUM COMPLEX PO), Take 1 capsule by mouth every evening. GNC CALCIMATE COMPLETE  VITAMIN D3 2000 UNITS-VITAMIN K2 50 MCG-CALCIUM CITRATE 800 MG- MAGNESIUM OXIDE 100 MG-ZINC OXIDE 7.5 MG-COPPER GLYCINATE 1 MG -MANGANESE GLUCONATE 1 MG- MBP 40 MG & BORON 1 MG, Disp: , Rfl:  .  Naproxen Sodium (ALEVE PO), Take by mouth daily after breakfast., Disp: , Rfl:  .  Omega-3 Fatty Acids (FISH OIL TRIPLE STRENGTH) 1400 MG CAPS, Take 1,400 mg by mouth daily at 2 PM. , Disp: , Rfl:  .   omeprazole (PRILOSEC) 20 MG capsule, Take 1 capsule (20 mg total) by mouth daily. Please schedule with a new provider for further refills, Disp: 90 capsule, Rfl: 1 .  potassium chloride SA (K-DUR,KLOR-CON) 20 MEQ tablet, TAKE 1 TABLET BY MOUTH  DAILY, Disp: 90 tablet, Rfl: 4 .  sildenafil (VIAGRA) 100 MG tablet, Take 1 tablet (100 mg total) by mouth daily as needed. (Patient taking differently: Take 100 mg by mouth daily as needed for erectile dysfunction. ), Disp: 10 tablet, Rfl: 11  EXAM:  Vitals:   03/06/19 1407  BP: 120/70  Pulse: 77  Temp: 98.6 F (37 C)    Body mass index is 27.65 kg/m.  GENERAL: vitals reviewed and listed above, alert, oriented, appears well hydrated and in no acute distress  HEENT: atraumatic, conjunttiva clear, no obvious abnormalities on inspection of external nose and ears  NECK: no obvious masses on inspection  LUNGS: clear to auscultation bilaterally, no wheezes, rales or rhonchi, good air movement  CV: HRRR, no peripheral edema  MS: moves all extremities without noticeable abnormality  PSYCH: pleasant and cooperative, no obvious depression or anxiety  ASSESSMENT AND PLAN:  Discussed the following assessment and plan:  Essential hypertension - Plan: Basic metabolic panel, CBC  Gastroesophageal reflux disease, esophagitis presence not specified  Osteoarthritis, unspecified osteoarthritis type, unspecified site  -labs per orders -lifestyle recs -cont management with specialist for urology, neurology and back issues -plans to try trial off of PPI and let us know if does not go well -discussed options for providers taking new patients - he plans to think about this and let us know and schedule appt for in 3-4 months -Patient advised to return or notify a doctor immediately if symptoms worsen or persist or new concerns arise.  Patient Instructions  BEFORE YOU LEAVE: -labs -follow up: New Patient visit with Dr. Ethlyn Gallery, Volanda Napoleon or [place]  in 3-4 months  We have ordered labs or studies at this visit. It can take up to 1-2 weeks for results and processing. IF results require follow up or explanation, we will call you with instructions. Clinically stable results will be released to your [facility]. If you have not heard from [place] or cannot find your results in [facility] in 2 weeks please contact our office at [phone].  If you are not yet signed up for [facility], please consider signing up.  Lucretia Kern, DO

## 2019-03-06 NOTE — Patient Instructions (Signed)
BEFORE YOU LEAVE: -labs -follow up: New Patient visit with Dr. Ethlyn Gallery, Volanda Napoleon or [place] in 3-4 months  We have ordered labs or studies at this visit. It can take up to 1-2 weeks for results and processing. IF results require follow up or explanation, we will call you with instructions. Clinically stable results will be released to your [facility]. If you have not heard from [place] or cannot find your results in [facility] in 2 weeks please contact our office at [phone].  If you are not yet signed up for [facility], please consider signing up.  Marland Kitchen

## 2019-03-27 ENCOUNTER — Other Ambulatory Visit: Payer: Self-pay

## 2019-03-28 NOTE — Telephone Encounter (Signed)
Pt came in for medication refill and did not establish care.

## 2019-03-28 NOTE — Telephone Encounter (Signed)
See previous message

## 2019-03-28 NOTE — Telephone Encounter (Signed)
Pt did establish care with Dr Maudie Mercury.  Pt requesting refill from Dr Maudie Mercury.

## 2019-03-29 ENCOUNTER — Encounter: Payer: Self-pay | Admitting: Family Medicine

## 2019-03-30 ENCOUNTER — Other Ambulatory Visit: Payer: Self-pay | Admitting: Family Medicine

## 2019-03-30 ENCOUNTER — Telehealth: Payer: Self-pay | Admitting: Family Medicine

## 2019-03-30 MED ORDER — GABAPENTIN 300 MG PO CAPS: ORAL_CAPSULE | ORAL | 0 refills | Status: DC

## 2019-03-30 MED ORDER — GABAPENTIN 300 MG PO CAPS: ORAL_CAPSULE | ORAL | 3 refills | Status: DC

## 2019-03-30 NOTE — Telephone Encounter (Signed)
I have sent in refill to [facility]; may want to stretch out the gabapentin he has left to daily use to buy him some time until rx comes by mail. I would be happy to send a small supply locally as well if desired (sometimes insurance has difficult time covering this, but we can offer).   Please apologize for the delay. Not sure if you can tell what happened. My guess is that it was getting sent to Dr. Sherren Mocha and not coming through to [place]. But also not sure why he didn't get anywhere if he was calling our office.

## 2019-03-30 NOTE — Telephone Encounter (Signed)
Copied from [place] [phone] [phone]. Topic: Quick Communication - Rx Refill/Question >> Mar 30, 2019 12:42 PM Leward Quan A wrote: Medication: gabapentin (NEURONTIN) 300 MG capsule  Has the patient contacted their pharmacy? Yes.   (Agent: If no, request that the patient contact the pharmacy for the refill.) (Agent: If yes, when and what did the pharmacy advise?)  Preferred Pharmacy (with phone number or street name): [place], [place] [phone] (Phone) [phone] (Fax)    Agent: Please be advised that RX refills may take up to 3 business days. We ask that you follow-up with your pharmacy.

## 2019-03-30 NOTE — Telephone Encounter (Signed)
TOC appointment scheduled with Dr Jerilee Hoh and refill sent.

## 2019-03-30 NOTE — Telephone Encounter (Signed)
Patient is requesting a refill on   gabapentin (NEURONTIN) 300 MG capsule  Sent to:  [place], [place] [phone] (Phone) [phone] (Fax)

## 2019-04-02 NOTE — Telephone Encounter (Signed)
See My chart message

## 2019-04-02 NOTE — Telephone Encounter (Signed)
Per Apolonio Schneiders she spoke with the pt on 4/3 and informed him the Rx was sent to the pharmacy.

## 2019-04-12 NOTE — Telephone Encounter (Signed)
Per Apolonio Schneiders Rx was sent for the pt-see Mychart message.

## 2019-04-12 NOTE — Telephone Encounter (Signed)
Looks like this was Just refilled by another provider? Ok to reorder if not for 90 days, but needs appt with one of our providers if doesn't have one already. Thanks.

## 2019-05-02 ENCOUNTER — Telehealth: Payer: Self-pay | Admitting: Internal Medicine

## 2019-05-02 ENCOUNTER — Encounter: Payer: Self-pay | Admitting: Internal Medicine

## 2019-05-02 ENCOUNTER — Ambulatory Visit (INDEPENDENT_AMBULATORY_CARE_PROVIDER_SITE_OTHER): Payer: Medicare Other | Admitting: Internal Medicine

## 2019-05-02 ENCOUNTER — Other Ambulatory Visit: Payer: Self-pay

## 2019-05-02 VITALS — BP 150/90 | HR 79 | Temp 98.3°F | Wt 192.5 lb

## 2019-05-02 DIAGNOSIS — M1990 Unspecified osteoarthritis, unspecified site: Secondary | ICD-10-CM

## 2019-05-02 DIAGNOSIS — I10 Essential (primary) hypertension: Secondary | ICD-10-CM

## 2019-05-02 DIAGNOSIS — C61 Malignant neoplasm of prostate: Secondary | ICD-10-CM

## 2019-05-02 DIAGNOSIS — F528 Other sexual dysfunction not due to a substance or known physiological condition: Secondary | ICD-10-CM

## 2019-05-02 DIAGNOSIS — Z981 Arthrodesis status: Secondary | ICD-10-CM

## 2019-05-02 MED ORDER — POTASSIUM CHLORIDE CRYS ER 20 MEQ PO TBCR: 20.0000 meq | EXTENDED_RELEASE_TABLET | Freq: Every day | ORAL | 1 refills | Status: DC

## 2019-05-02 MED ORDER — AMLODIPINE BESYLATE 10 MG PO TABS: 10.0000 mg | ORAL_TABLET | Freq: Every day | ORAL | 1 refills | Status: DC

## 2019-05-02 NOTE — Patient Instructions (Signed)
-It was nice meeting you today!  -Make sure you check your bloos pressure at home 2-3 times a week and bring those measurements in to your next visit.  -Schedule a follow up for your physical and annual wellness visit in 3-4 months (with me, same day).   Hypertension Hypertension, commonly called high blood pressure, is when the force of blood pumping through the arteries is too strong. The arteries are the blood vessels that carry blood from the heart throughout the body. Hypertension forces the heart to work harder to pump blood and may cause arteries to become narrow or stiff. Having untreated or uncontrolled hypertension can cause heart attacks, strokes, kidney disease, and other problems. A blood pressure reading consists of a higher number over a lower number. Ideally, your blood pressure should be below 120/80. The first ("top") number is called the systolic pressure. It is a measure of the pressure in your arteries as your heart beats. The second ("bottom") number is called the diastolic pressure. It is a measure of the pressure in your arteries as the heart relaxes. What are the causes? The cause of this condition is not known. What increases the risk? Some risk factors for high blood pressure are under your control. Others are not. Factors you can change  Smoking.  Having type 2 diabetes mellitus, high cholesterol, or both.  Not getting enough exercise or physical activity.  Being overweight.  Having too much fat, sugar, calories, or salt (sodium) in your diet.  Drinking too much alcohol. Factors that are difficult or impossible to change  Having chronic kidney disease.  Having a family history of high blood pressure.  Age. Risk increases with age.  Race. You may be at higher risk if you are African-American.  Gender. Men are at higher risk than women before age 51. After age 44, women are at higher risk than men.  Having obstructive sleep apnea.  Stress. What are  the signs or symptoms? Extremely high blood pressure (hypertensive crisis) may cause:  Headache.  Anxiety.  Shortness of breath.  Nosebleed.  Nausea and vomiting.  Severe chest pain.  Jerky movements you cannot control (seizures). How is this diagnosed? This condition is diagnosed by measuring your blood pressure while you are seated, with your arm resting on a surface. The cuff of the blood pressure monitor will be placed directly against the skin of your upper arm at the level of your heart. It should be measured at least twice using the same arm. Certain conditions can cause a difference in blood pressure between your right and left arms. Certain factors can cause blood pressure readings to be lower or higher than normal (elevated) for a short period of time:  When your blood pressure is higher when you are in a health care provider's office than when you are at home, this is called white coat hypertension. Most people with this condition do not need medicines.  When your blood pressure is higher at home than when you are in a health care provider's office, this is called masked hypertension. Most people with this condition may need medicines to control blood pressure. If you have a high blood pressure reading during one visit or you have normal blood pressure with other risk factors:  You may be asked to return on a different day to have your blood pressure checked again.  You may be asked to monitor your blood pressure at home for 1 week or longer. If you are diagnosed with hypertension,  you may have other blood or imaging tests to help your health care provider understand your overall risk for other conditions. How is this treated? This condition is treated by making healthy lifestyle changes, such as eating healthy foods, exercising more, and reducing your alcohol intake. Your health care provider may prescribe medicine if lifestyle changes are not enough to get your blood  pressure under control, and if:  Your systolic blood pressure is above 130.  Your diastolic blood pressure is above 80. Your personal target blood pressure may vary depending on your medical conditions, your age, and other factors. Follow these instructions at home: Eating and drinking   Eat a diet that is high in fiber and potassium, and low in sodium, added sugar, and fat. An example eating plan is called the DASH (Dietary Approaches to Stop Hypertension) diet. To eat this way: ? Eat plenty of fresh fruits and vegetables. Try to fill half of your plate at each meal with fruits and vegetables. ? Eat whole grains, such as whole wheat pasta, brown rice, or whole grain bread. Fill about one quarter of your plate with whole grains. ? Eat or drink low-fat dairy products, such as skim milk or low-fat yogurt. ? Avoid fatty cuts of meat, processed or cured meats, and poultry with skin. Fill about one quarter of your plate with lean proteins, such as fish, chicken without skin, beans, eggs, and tofu. ? Avoid premade and processed foods. These tend to be higher in sodium, added sugar, and fat.  Reduce your daily sodium intake. Most people with hypertension should eat less than 1,500 mg of sodium a day.  Limit alcohol intake to no more than 1 drink a day for nonpregnant women and 2 drinks a day for men. One drink equals 12 oz of beer, 5 oz of wine, or 1 oz of hard liquor. Lifestyle   Work with your health care provider to maintain a healthy body weight or to lose weight. Ask what an ideal weight is for you.  Get at least 30 minutes of exercise that causes your heart to beat faster (aerobic exercise) most days of the week. Activities may include walking, swimming, or biking.  Include exercise to strengthen your muscles (resistance exercise), such as pilates or lifting weights, as part of your weekly exercise routine. Try to do these types of exercises for 30 minutes at least 3 days a week.  Do not  use any products that contain nicotine or tobacco, such as cigarettes and e-cigarettes. If you need help quitting, ask your health care provider.  Monitor your blood pressure at home as told by your health care provider.  Keep all follow-up visits as told by your health care provider. This is important. Medicines  Take over-the-counter and prescription medicines only as told by your health care provider. Follow directions carefully. Blood pressure medicines must be taken as prescribed.  Do not skip doses of blood pressure medicine. Doing this puts you at risk for problems and can make the medicine less effective.  Ask your health care provider about side effects or reactions to medicines that you should watch for. Contact a health care provider if:  You think you are having a reaction to a medicine you are taking.  You have headaches that keep coming back (recurring).  You feel dizzy.  You have swelling in your ankles.  You have trouble with your vision. Get help right away if:  You develop a severe headache or confusion.  You have  unusual weakness or numbness.  You feel faint.  You have severe pain in your chest or abdomen.  You vomit repeatedly.  You have trouble breathing. Summary  Hypertension is when the force of blood pumping through your arteries is too strong. If this condition is not controlled, it may put you at risk for serious complications.  Your personal target blood pressure may vary depending on your medical conditions, your age, and other factors. For most people, a normal blood pressure is less than 120/80.  Hypertension is treated with lifestyle changes, medicines, or a combination of both. Lifestyle changes include weight loss, eating a healthy, low-sodium diet, exercising more, and limiting alcohol. This information is not intended to replace advice given to you by your health care provider. Make sure you discuss any questions you have with your health  care provider. Document Released: 12/13/2005 Document Revised: 11/10/2016 Document Reviewed: 11/10/2016 Elsevier Interactive Patient Education  2019 [employer].

## 2019-05-02 NOTE — Telephone Encounter (Signed)
Refills sent

## 2019-05-02 NOTE — Progress Notes (Signed)
Established Patient Office Visit     CC/Reason for Visit: Establish care, follow-up on chronic medical conditions  HPI: Antonio Newton is a 73 y.o. male who is coming in today for the above mentioned reasons. Past Medical History is significant for: Hypertension that has been well controlled, prostate cancer followed by Dr. Alinda Money on observation therapy, GERD controlled on PPI, he is status post a cervical fusion, he has lumbar spinal stenosis with neuropathy and takes gabapentin for this.  He has no acute complaints today.  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, he stays active, walks on the treadmill 3 times a week and is very active in his garden.  He is a never smoker, drinks alcohol occasionally.  He is due for his annual physical and wellness visit.   Past Medical/Surgical History: Past Medical History:  Diagnosis Date  . Allergy   . Anxiety   . Cancer [facility])    prostate- Dr. Vickey Sages waiting"  . ED (erectile dysfunction)   . Heart murmur    pt thinks he had a murmur many years ago.  Not sure now.maw  . History of hiatal hernia    inguninal  . Hypertension   . Incarcerated right inguinal hernia 12/23/2016  . Neuromuscular disorder (HCC)    neuropathy - mildly in bil hands-is improved since neck surgery  . Neuropathy   . OA (osteoarthritis)    left hip  . Pneumonia     Past Surgical History:  Procedure Laterality Date  . ANTERIOR CERVICAL DECOMP/DISCECTOMY FUSION N/A 02/06/2016   Procedure: Cevical three-four, Cervical four-five, Cervical five-six anterior cervical decompression with fusion interbody prosthesis plating and bonegraft;  Surgeon: Consuella Lose, MD;  Location: [place] [facility];  Service: Neurosurgery;  Laterality: N/A;  . COLONOSCOPY    . INGUINAL HERNIA REPAIR Right 12/23/2016   Procedure:  OPEN REPAIR RIGHT INGUINAL HERNIA WITH MESH;  Surgeon: Fanny Skates, MD;  Location: [facility];  Service: General;  Laterality: Right;  . INSERTION OF MESH Right 12/23/2016   Procedure: INSERTION OF MESH;  Surgeon: Fanny Skates, MD;  Location: [facility];  Service: General;  Laterality: Right;  . None    . postate biopsy      Social History:  reports that he has never smoked. He has never used smokeless tobacco. He reports current alcohol use. He reports that he does not use drugs.  Allergies: Allergies  Allergen Reactions  . Penicillins Other (See Comments)    REACTION: family hx Has patient had a PCN reaction causing immediate rash, facial/tongue/throat swelling, SOB or lightheadedness with hypotension:No Has patient had a PCN reaction causing severe rash involving mucus membranes or skin necrosis: No Has patient had a PCN reaction that required hospitalization No Has patient had a PCN reaction occurring within the last 10 years: No If all of the above answers are "NO", then may proceed with Cephalosporin use.     Family History:  Family History  Problem Relation Age of Onset  . Hypertension Father        Deceased, 59  . Hypertension Mother        Deceased, 67  . Healthy Sister   . Esophageal cancer Paternal Grandfather   . Colon cancer Neg Hx   . Rectal cancer Neg Hx   . Stomach cancer Neg Hx  Current Outpatient Medications:  .  amLODipine (NORVASC) 10 MG tablet, Take 1 tablet (10 mg total) by mouth daily., Disp: 90 tablet, Rfl: 4 .  aspirin EC 81 MG tablet, Take 81 mg by mouth daily., Disp: , Rfl:  .  fluticasone (FLONASE) 50 MCG/ACT nasal spray, Place 2 sprays into both nostrils daily., Disp: 16 g, Rfl: 6 .  gabapentin (NEURONTIN) 300 MG capsule, TAKE 1 CAPSULE BY MOUTH TWO TIMES DAILY, Disp: 180 capsule, Rfl: 0 .  Glycerin-Hypromellose-PEG 400 (VISINE TIRED EYE RELIEF) 0.2-0.36-1 % SOLN, Place 1-2 drops into both eyes 3 (three) times daily as needed (for dry/tired  eyes.)., Disp: , Rfl:  .  ibuprofen (ADVIL,MOTRIN) 200 MG tablet, Take 400 mg by mouth 2 (two) times daily as needed (for pain relief.)., Disp: , Rfl:  .  levocetirizine (XYZAL) 5 MG tablet, TAKE 1 TABLET BY MOUTH  EVERY EVENING, Disp: 90 tablet, Rfl: 1 .  losartan-hydrochlorothiazide (HYZAAR) 100-12.5 MG tablet, Take 1 tablet by mouth daily., Disp: 90 tablet, Rfl: 3 .  Naproxen Sodium (ALEVE PO), Take by mouth daily after breakfast., Disp: , Rfl:  .  Omega-3 Fatty Acids (FISH OIL TRIPLE STRENGTH) 1400 MG CAPS, Take 1,400 mg by mouth daily at 2 PM. , Disp: , Rfl:  .  omeprazole (PRILOSEC) 20 MG capsule, Take 1 capsule (20 mg total) by mouth daily. Please schedule with a new provider for further refills, Disp: 90 capsule, Rfl: 1 .  potassium chloride SA (K-DUR,KLOR-CON) 20 MEQ tablet, TAKE 1 TABLET BY MOUTH  DAILY, Disp: 90 tablet, Rfl: 4 .  sildenafil (VIAGRA) 100 MG tablet, Take 1 tablet (100 mg total) by mouth daily as needed. (Patient taking differently: Take 100 mg by mouth daily as needed for erectile dysfunction. ), Disp: 10 tablet, Rfl: 11  Review of Systems:  Constitutional: Denies fever, chills, diaphoresis, appetite change and fatigue.  HEENT: Denies photophobia, eye pain, redness, hearing loss, ear pain, congestion, sore throat, rhinorrhea, sneezing, mouth sores, trouble swallowing, neck pain, neck stiffness and tinnitus.   Respiratory: Denies SOB, DOE, cough, chest tightness,  and wheezing.   Cardiovascular: Denies chest pain, palpitations and leg swelling.  Gastrointestinal: Denies nausea, vomiting, abdominal pain, diarrhea, constipation, blood in stool and abdominal distention.  Genitourinary: Denies dysuria, urgency, frequency, hematuria, flank pain and difficulty urinating.  Endocrine: Denies: hot or cold intolerance, sweats, changes in hair or nails, polyuria, polydipsia. Musculoskeletal: Denies myalgias, back pain, joint swelling, arthralgias and gait problem.  Skin: Denies  pallor, rash and wound.  Neurological: Denies dizziness, seizures, syncope, weakness, light-headedness, and headaches.  Hematological: Denies adenopathy. Easy bruising, personal or family bleeding history  Psychiatric/Behavioral: Denies suicidal ideation, mood changes, confusion, nervousness, sleep disturbance and agitation    Physical Exam: Vitals:   05/02/19 0832  BP: (!) 150/90  Pulse: 79  Temp: 98.3 F (36.8 C)  TempSrc: Oral  SpO2: 97%  Weight: 192 lb 8 oz (87.3 kg)    Body mass index is 27.23 kg/m.    Constitutional: NAD, calm, comfortable Eyes: PERRL, lids and conjunctivae normal, wears corrective lenses ENMT: Mucous membranes are moist.  Neck: normal, supple, no masses, no thyromegaly Respiratory: clear to auscultation bilaterally, no wheezing, no crackles. Normal respiratory effort. No accessory muscle use.  Cardiovascular: Regular rate and rhythm, no murmurs / rubs / gallops. No extremity edema. 2+ pedal pulses. No carotid bruits.  Musculoskeletal: no clubbing / cyanosis. No joint deformity upper and lower extremities. Good ROM, no contractures. Normal muscle tone.  Skin: no rashes,  lesions, ulcers. No induration Neurologic: Grossly intact and nonfocal  psychiatric: Normal judgment and insight. Alert and oriented x 3. Normal mood.    Impression and Plan:  ERECTILE DYSFUNCTION, MILD -Managed by urology, takes sildenafil as needed  Essential hypertension -Blood pressure is elevated today at 150/90, he states he was anxious at meeting a new provider and had not taken his medications yet this morning. -Will give him the benefit of the doubt and have him follow-up in 3 months. -He has been advised to do ambulatory blood pressure monitoring.  Osteoarthritis, unspecified osteoarthritis type, unspecified site -Takes Aleve or Motrin on a daily basis, we have discussed the pros and cons of this.  Prostate cancer [facility]) -Followed by urology, Dr. Alinda Money. -Currently on  observation basis with twice annual follow-ups and PSAs.  S/P cervical spinal fusion/lumbar spine stenosis -He takes NSAIDs daily as well as gabapentin 300 mg twice daily for neuropathy and burning sensation in his toes and fingers, appears relatively well controlled. -He also receives back injections 2-3 times a year.    Patient Instructions  -It was nice meeting you today!  -Make sure you check your bloos pressure at home 2-3 times a week and bring those measurements in to your next visit.  -Schedule a follow up for your physical and annual wellness visit in 3-4 months (with me, same day).   Hypertension Hypertension, commonly called high blood pressure, is when the force of blood pumping through the arteries is too strong. The arteries are the blood vessels that carry blood from the heart throughout the body. Hypertension forces the heart to work harder to pump blood and may cause arteries to become narrow or stiff. Having untreated or uncontrolled hypertension can cause heart attacks, strokes, kidney disease, and other problems. A blood pressure reading consists of a higher number over a lower number. Ideally, your blood pressure should be below 120/80. The first ("top") number is called the systolic pressure. It is a measure of the pressure in your arteries as your heart beats. The second ("bottom") number is called the diastolic pressure. It is a measure of the pressure in your arteries as the heart relaxes. What are the causes? The cause of this condition is not known. What increases the risk? Some risk factors for high blood pressure are under your control. Others are not. Factors you can change  Smoking.  Having type 2 diabetes mellitus, high cholesterol, or both.  Not getting enough exercise or physical activity.  Being overweight.  Having too much fat, sugar, calories, or salt (sodium) in your diet.  Drinking too much alcohol. Factors that are difficult or impossible to  change  Having chronic kidney disease.  Having a family history of high blood pressure.  Age. Risk increases with age.  Race. You may be at higher risk if you are African-American.  Gender. Men are at higher risk than women before age 67. After age 45, women are at higher risk than men.  Having obstructive sleep apnea.  Stress. What are the signs or symptoms? Extremely high blood pressure (hypertensive crisis) may cause:  Headache.  Anxiety.  Shortness of breath.  Nosebleed.  Nausea and vomiting.  Severe chest pain.  Jerky movements you cannot control (seizures). How is this diagnosed? This condition is diagnosed by measuring your blood pressure while you are seated, with your arm resting on a surface. The cuff of the blood pressure monitor will be placed directly against the skin of your upper arm at the level  of your heart. It should be measured at least twice using the same arm. Certain conditions can cause a difference in blood pressure between your right and left arms. Certain factors can cause blood pressure readings to be lower or higher than normal (elevated) for a short period of time:  When your blood pressure is higher when you are in a health care provider's office than when you are at home, this is called white coat hypertension. Most people with this condition do not need medicines.  When your blood pressure is higher at home than when you are in a health care provider's office, this is called masked hypertension. Most people with this condition may need medicines to control blood pressure. If you have a high blood pressure reading during one visit or you have normal blood pressure with other risk factors:  You may be asked to return on a different day to have your blood pressure checked again.  You may be asked to monitor your blood pressure at home for 1 week or longer. If you are diagnosed with hypertension, you may have other blood or imaging tests to help  your health care provider understand your overall risk for other conditions. How is this treated? This condition is treated by making healthy lifestyle changes, such as eating healthy foods, exercising more, and reducing your alcohol intake. Your health care provider may prescribe medicine if lifestyle changes are not enough to get your blood pressure under control, and if:  Your systolic blood pressure is above 130.  Your diastolic blood pressure is above 80. Your personal target blood pressure may vary depending on your medical conditions, your age, and other factors. Follow these instructions at home: Eating and drinking   Eat a diet that is high in fiber and potassium, and low in sodium, added sugar, and fat. An example eating plan is called the DASH (Dietary Approaches to Stop Hypertension) diet. To eat this way: ? Eat plenty of fresh fruits and vegetables. Try to fill half of your plate at each meal with fruits and vegetables. ? Eat whole grains, such as whole wheat pasta, brown rice, or whole grain bread. Fill about one quarter of your plate with whole grains. ? Eat or drink low-fat dairy products, such as skim milk or low-fat yogurt. ? Avoid fatty cuts of meat, processed or cured meats, and poultry with skin. Fill about one quarter of your plate with lean proteins, such as fish, chicken without skin, beans, eggs, and tofu. ? Avoid premade and processed foods. These tend to be higher in sodium, added sugar, and fat.  Reduce your daily sodium intake. Most people with hypertension should eat less than 1,500 mg of sodium a day.  Limit alcohol intake to no more than 1 drink a day for nonpregnant women and 2 drinks a day for men. One drink equals 12 oz of beer, 5 oz of wine, or 1 oz of hard liquor. Lifestyle   Work with your health care provider to maintain a healthy body weight or to lose weight. Ask what an ideal weight is for you.  Get at least 30 minutes of exercise that causes your  heart to beat faster (aerobic exercise) most days of the week. Activities may include walking, swimming, or biking.  Include exercise to strengthen your muscles (resistance exercise), such as pilates or lifting weights, as part of your weekly exercise routine. Try to do these types of exercises for 30 minutes at least 3 days a week.  Do not use any products that contain nicotine or tobacco, such as cigarettes and e-cigarettes. If you need help quitting, ask your health care provider.  Monitor your blood pressure at home as told by your health care provider.  Keep all follow-up visits as told by your health care provider. This is important. Medicines  Take over-the-counter and prescription medicines only as told by your health care provider. Follow directions carefully. Blood pressure medicines must be taken as prescribed.  Do not skip doses of blood pressure medicine. Doing this puts you at risk for problems and can make the medicine less effective.  Ask your health care provider about side effects or reactions to medicines that you should watch for. Contact a health care provider if:  You think you are having a reaction to a medicine you are taking.  You have headaches that keep coming back (recurring).  You feel dizzy.  You have swelling in your ankles.  You have trouble with your vision. Get help right away if:  You develop a severe headache or confusion.  You have unusual weakness or numbness.  You feel faint.  You have severe pain in your chest or abdomen.  You vomit repeatedly.  You have trouble breathing. Summary  Hypertension is when the force of blood pumping through your arteries is too strong. If this condition is not controlled, it may put you at risk for serious complications.  Your personal target blood pressure may vary depending on your medical conditions, your age, and other factors. For most people, a normal blood pressure is less than 120/80.   Hypertension is treated with lifestyle changes, medicines, or a combination of both. Lifestyle changes include weight loss, eating a healthy, low-sodium diet, exercising more, and limiting alcohol. This information is not intended to replace advice given to you by your health care provider. Make sure you discuss any questions you have with your health care provider. Document Released: 12/13/2005 Document Revised: 11/10/2016 Document Reviewed: 11/10/2016 Elsevier Interactive Patient Education  2019 [place], [place] [facility]

## 2019-05-02 NOTE — Telephone Encounter (Signed)
Copied from [place] [id number]. Topic: Quick Communication - Rx Refill/Question >> May 02, 2019 10:54 AM Keene Breath wrote: Medication: potassium chloride SA (K-DUR,KLOR-CON) 20 MEQ tablet / amLODipine (NORVASC) 10 MG tablet  Patient called to request a refill for the above medication.  Please make note that Dr. Isaac Bliss is the patient's new doctor and she did not show in the [facility] registry.  It needs to be updated with her information, per patient.  Preferred Pharmacy (with phone number or street name): [place], [place] [employer] [phone] (Phone) [phone] (Fax)

## 2019-05-12 ENCOUNTER — Encounter: Payer: Self-pay | Admitting: Internal Medicine

## 2019-05-15 ENCOUNTER — Other Ambulatory Visit: Payer: Self-pay | Admitting: Internal Medicine

## 2019-05-15 MED ORDER — OMEPRAZOLE 20 MG PO CPDR: 20.0000 mg | DELAYED_RELEASE_CAPSULE | Freq: Two times a day (BID) | ORAL | 1 refills | Status: DC

## 2019-05-23 ENCOUNTER — Telehealth: Payer: Self-pay | Admitting: Internal Medicine

## 2019-05-23 MED ORDER — LEVOCETIRIZINE DIHYDROCHLORIDE 5 MG PO TABS: 5.0000 mg | ORAL_TABLET | Freq: Every evening | ORAL | 1 refills | Status: DC

## 2019-05-23 NOTE — Telephone Encounter (Signed)
Copied from [place] [id number]. Topic: Quick Communication - Rx Refill/Question >> May 23, 2019 11:44 AM Sheran Luz wrote: Medication: levocetirizine (XYZAL) 5 MG tablet   Patient is requesting refill.   Preferred Pharmacy (with phone number or street name): [place], [place] [phone] (Phone) [phone] (Fax)    Agent: Please be advised that RX refills may take up to 3 business days. We ask that you follow-up with your pharmacy.

## 2019-06-12 ENCOUNTER — Encounter: Payer: Self-pay | Admitting: Internal Medicine

## 2019-06-13 NOTE — Telephone Encounter (Signed)
Pt calling back and would like to know if his questions could be answered before he makes an appointment. Please advise.   TR#[id number]

## 2019-07-18 ENCOUNTER — Encounter: Payer: Self-pay | Admitting: Internal Medicine

## 2019-07-18 ENCOUNTER — Ambulatory Visit (INDEPENDENT_AMBULATORY_CARE_PROVIDER_SITE_OTHER): Payer: Medicare Other | Admitting: Internal Medicine

## 2019-07-18 ENCOUNTER — Other Ambulatory Visit: Payer: Self-pay

## 2019-07-18 VITALS — BP 140/80 | HR 77 | Temp 98.2°F | Ht 70.5 in | Wt 190.6 lb

## 2019-07-18 DIAGNOSIS — K219 Gastro-esophageal reflux disease without esophagitis: Secondary | ICD-10-CM | POA: Insufficient documentation

## 2019-07-18 DIAGNOSIS — Z0000 Encounter for general adult medical examination without abnormal findings: Secondary | ICD-10-CM

## 2019-07-18 DIAGNOSIS — I10 Essential (primary) hypertension: Secondary | ICD-10-CM | POA: Diagnosis not present

## 2019-07-18 DIAGNOSIS — E785 Hyperlipidemia, unspecified: Secondary | ICD-10-CM

## 2019-07-18 DIAGNOSIS — C61 Malignant neoplasm of prostate: Secondary | ICD-10-CM

## 2019-07-18 DIAGNOSIS — F528 Other sexual dysfunction not due to a substance or known physiological condition: Secondary | ICD-10-CM

## 2019-07-18 LAB — COMPREHENSIVE METABOLIC PANEL
ALT: 18 U/L (ref 0–53)
AST: 19 U/L (ref 0–37)
Albumin: 4.8 g/dL (ref 3.5–5.2)
Alkaline Phosphatase: 74 U/L (ref 39–117)
BUN: 18 mg/dL (ref 6–23)
CO2: 31 mEq/L (ref 19–32)
Calcium: 9.4 mg/dL (ref 8.4–10.5)
Chloride: 99 mEq/L (ref 96–112)
Creatinine, Ser: 1.01 mg/dL (ref 0.40–1.50)
GFR: 87.72 mL/min (ref >60.00)
Glucose, Bld: 93 mg/dL (ref 70–99)
Potassium: 3.3 mEq/L — ABNORMAL LOW (ref 3.5–5.1)
Sodium: 139 mEq/L (ref 135–145)
Total Bilirubin: 1 mg/dL (ref 0.2–1.2)
Total Protein: 7.3 g/dL (ref 6.0–8.3)

## 2019-07-18 LAB — CBC WITH DIFFERENTIAL/PLATELET
Basophils Absolute: 0 10*3/uL (ref 0.0–0.1)
Basophils Relative: 0.4 % (ref 0.0–3.0)
Eosinophils Absolute: 0.3 10*3/uL (ref 0.0–0.7)
Eosinophils Relative: 5.7 % — ABNORMAL HIGH (ref 0.0–5.0)
HCT: 42.9 % (ref 39.0–52.0)
Hemoglobin: 14.7 g/dL (ref 13.0–17.0)
Lymphocytes Relative: 18.7 % (ref 12.0–46.0)
Lymphs Abs: 0.9 10*3/uL (ref 0.7–4.0)
MCHC: 34.4 g/dL (ref 30.0–36.0)
MCV: 87.9 fl (ref 78.0–100.0)
Monocytes Absolute: 0.3 10*3/uL (ref 0.1–1.0)
Monocytes Relative: 6.5 % (ref 3.0–12.0)
Neutro Abs: 3.5 10*3/uL (ref 1.4–7.7)
Neutrophils Relative %: 68.7 % (ref 43.0–77.0)
Platelets: 187 10*3/uL (ref 150.0–400.0)
RBC: 4.87 Mil/uL (ref 4.22–5.81)
RDW: 12.9 % (ref 11.5–15.5)
WBC: 5 10*3/uL (ref 4.0–10.5)

## 2019-07-18 LAB — HEMOGLOBIN A1C: Hgb A1c MFr Bld: 4.8 % (ref 4.6–6.5)

## 2019-07-18 LAB — LIPID PANEL
Cholesterol: 168 mg/dL (ref 0–200)
HDL: 44 mg/dL (ref >39.00)
LDL Cholesterol: 96 mg/dL (ref 0–99)
NonHDL: 123.77
Total CHOL/HDL Ratio: 4
Triglycerides: 139 mg/dL (ref 0.0–149.0)
VLDL: 27.8 mg/dL (ref 0.0–40.0)

## 2019-07-18 LAB — VITAMIN D 25 HYDROXY (VIT D DEFICIENCY, FRACTURES): VITD: 54.2 ng/mL (ref 30.00–100.00)

## 2019-07-18 LAB — VITAMIN B12: Vitamin B-12: 359 pg/mL (ref 211–911)

## 2019-07-18 LAB — TSH: TSH: 1.35 u[IU]/mL (ref 0.35–4.50)

## 2019-07-18 MED ORDER — HYDROCHLOROTHIAZIDE 25 MG PO TABS: 25.0000 mg | ORAL_TABLET | Freq: Every day | ORAL | 1 refills | Status: DC

## 2019-07-18 MED ORDER — LOSARTAN POTASSIUM 100 MG PO TABS: 100.0000 mg | ORAL_TABLET | Freq: Every day | ORAL | 1 refills | Status: DC

## 2019-07-18 NOTE — Patient Instructions (Signed)
-Nice seeing you today!!  -Lab work today; will notify you once results are available.  -STOP combination blood pressure pill.  -Start Losartan 100 mg daily and HCTZ 25 mg daily.  -Schedule follow up in 8 weeks for blood pressure check.   Preventive Care 73 Years and Older, Male Preventive care refers to lifestyle choices and visits with your health care provider that can promote health and wellness. This includes:  A yearly physical exam. This is also called an annual well check.  Regular dental and eye exams.  Immunizations.  Screening for certain conditions.  Healthy lifestyle choices, such as diet and exercise. What can I expect for my preventive care visit? Physical exam Your health care provider will check:  Height and weight. These may be used to calculate body mass index (BMI), which is a measurement that tells if you are at a healthy weight.  Heart rate and blood pressure.  Your skin for abnormal spots. Counseling Your health care provider may ask you questions about:  Alcohol, tobacco, and drug use.  Emotional well-being.  Home and relationship well-being.  Sexual activity.  Eating habits.  History of falls.  Memory and ability to understand (cognition).  Work and work Statistician. What immunizations do I need?  Influenza (flu) vaccine  This is recommended every year. Tetanus, diphtheria, and pertussis (Tdap) vaccine  You may need a Td booster every 10 years. Varicella (chickenpox) vaccine  You may need this vaccine if you have not already been vaccinated. Zoster (shingles) vaccine  You may need this after age 45. Pneumococcal conjugate (PCV13) vaccine  One dose is recommended after age 58. Pneumococcal polysaccharide (PPSV23) vaccine  One dose is recommended after age 19. Measles, mumps, and rubella (MMR) vaccine  You may need at least one dose of MMR if you were born in 1957 or later. You may also need a second dose. Meningococcal  conjugate (MenACWY) vaccine  You may need this if you have certain conditions. Hepatitis A vaccine  You may need this if you have certain conditions or if you travel or work in places where you may be exposed to hepatitis A. Hepatitis B vaccine  You may need this if you have certain conditions or if you travel or work in places where you may be exposed to hepatitis B. Haemophilus influenzae type b (Hib) vaccine  You may need this if you have certain conditions. You may receive vaccines as individual doses or as more than one vaccine together in one shot (combination vaccines). Talk with your health care provider about the risks and benefits of combination vaccines. What tests do I need? Blood tests  Lipid and cholesterol levels. These may be checked every 5 years, or more frequently depending on your overall health.  Hepatitis C test.  Hepatitis B test. Screening  Lung cancer screening. You may have this screening every year starting at age 32 if you have a 30-pack-year history of smoking and currently smoke or have quit within the past 15 years.  Colorectal cancer screening. All adults should have this screening starting at age 52 and continuing until age 99. Your health care provider may recommend screening at age 4 if you are at increased risk. You will have tests every 1-10 years, depending on your results and the type of screening test.  Prostate cancer screening. Recommendations will vary depending on your family history and other risks.  Diabetes screening. This is done by checking your blood sugar (glucose) after you have not eaten for  a while (fasting). You may have this done every 1-3 years.  Abdominal aortic aneurysm (AAA) screening. You may need this if you are a current or former smoker.  Sexually transmitted disease (STD) testing. Follow these instructions at home: Eating and drinking  Eat a diet that includes fresh fruits and vegetables, whole grains, lean  protein, and low-fat dairy products. Limit your intake of foods with high amounts of sugar, saturated fats, and salt.  Take vitamin and mineral supplements as recommended by your health care provider.  Do not drink alcohol if your health care provider tells you not to drink.  If you drink alcohol: ? Limit how much you have to 0-2 drinks a day. ? Be aware of how much alcohol is in your drink. In the [place] one drink equals one 12 oz bottle of beer (355 mL), one 5 oz glass of wine (148 mL), or one 1 oz glass of hard liquor (44 mL). Lifestyle  Take daily care of your teeth and gums.  Stay active. Exercise for at least 30 minutes on 5 or more days each week.  Do not use any products that contain nicotine or tobacco, such as cigarettes, e-cigarettes, and chewing tobacco. If you need help quitting, ask your health care provider.  If you are sexually active, practice safe sex. Use a condom or other form of protection to prevent STIs (sexually transmitted infections).  Talk with your health care provider about taking a low-dose aspirin or statin. What's next?  Visit your health care provider once a year for a well check visit.  Ask your health care provider how often you should have your eyes and teeth checked.  Stay up to date on all vaccines. This information is not intended to replace advice given to you by your health care provider. Make sure you discuss any questions you have with your health care provider. Document Released: 01/09/2016 Document Revised: 12/07/2018 Document Reviewed: 12/07/2018 Elsevier Patient Education  2020 [employer].

## 2019-07-18 NOTE — Progress Notes (Signed)
Established Patient Office Visit     CC/Reason for Visit: Annual preventive exam and subsequent Medicare wellness visit  HPI: Antonio Newton is a 73 y.o. male who is coming in today for the above mentioned reasons. Past Medical History is significant for: Hypertension that has been well controlled, prostate cancer followed by Dr. Alinda Money on observation therapy, GERD has not been well controlled on once daily PPI, he is status post a cervical fusion, he has lumbar spinal stenosis with neuropathy and takes gabapentin for this.   He is complaining of some increased dyspepsia type symptoms and is wondering if he can increase his omeprazole to twice daily.  He has some concerns about his blood pressures at home still been elevated. BP log below:       Past Medical/Surgical History: Past Medical History:  Diagnosis Date  . Allergy   . Anxiety   . Cancer [facility])    prostate- Dr. Vickey Sages waiting"  . ED (erectile dysfunction)   . Heart murmur    pt thinks he had a murmur many years ago.  Not sure now.maw  . History of hiatal hernia    inguninal  . Hypertension   . Incarcerated right inguinal hernia 12/23/2016  . Neuromuscular disorder (HCC)    neuropathy - mildly in bil hands-is improved since neck surgery  . Neuropathy   . OA (osteoarthritis)    left hip  . Pneumonia     Past Surgical History:  Procedure Laterality Date  . ANTERIOR CERVICAL DECOMP/DISCECTOMY FUSION N/A 02/06/2016   Procedure: Cevical three-four, Cervical four-five, Cervical five-six anterior cervical decompression with fusion interbody prosthesis plating and bonegraft;  Surgeon: Consuella Lose, MD;  Location: [facility];  Service: Neurosurgery;  Laterality: N/A;  . COLONOSCOPY    . INGUINAL HERNIA REPAIR Right 12/23/2016   Procedure: OPEN REPAIR RIGHT INGUINAL HERNIA WITH MESH;  Surgeon: Fanny Skates, MD;  Location: [facility];  Service: General;  Laterality: Right;  . INSERTION OF MESH  Right 12/23/2016   Procedure: INSERTION OF MESH;  Surgeon: Fanny Skates, MD;  Location: [facility];  Service: General;  Laterality: Right;  . None    . postate biopsy      Social History:  reports that he has never smoked. He has never used smokeless tobacco. He reports current alcohol use. He reports that he does not use drugs.  Allergies: Allergies  Allergen Reactions  . Penicillins Other (See Comments)    REACTION: family hx Has patient had a PCN reaction causing immediate rash, facial/tongue/throat swelling, SOB or lightheadedness with hypotension:No Has patient had a PCN reaction causing severe rash involving mucus membranes or skin necrosis: No Has patient had a PCN reaction that required hospitalization No Has patient had a PCN reaction occurring within the last 10 years: No If all of the above answers are "NO", then may proceed with Cephalosporin use.     Family History:  Family History  Problem Relation Age of Onset  . Hypertension Father        Deceased, 15  . Hypertension Mother        Deceased, 71  . Healthy Sister   . Esophageal cancer Paternal Grandfather   . Colon cancer Neg Hx   . Rectal cancer Neg Hx   . Stomach cancer Neg Hx      Current Outpatient Medications:  .  amLODipine (NORVASC) 10 MG tablet, Take 1 tablet (10 mg total) by mouth daily., Disp: 90 tablet, Rfl: 1 .  aspirin EC 81 MG tablet, Take 81 mg by mouth daily., Disp: , Rfl:  .  fluticasone (FLONASE) 50 MCG/ACT nasal spray, Place 2 sprays into both nostrils daily., Disp: 16 g, Rfl: 6 .  gabapentin (NEURONTIN) 300 MG capsule, TAKE 1 CAPSULE BY MOUTH TWO TIMES DAILY, Disp: 180 capsule, Rfl: 0 .  Glycerin-Hypromellose-PEG 400 (VISINE TIRED EYE RELIEF) 0.2-0.36-1 % SOLN, Place 1-2 drops into both eyes 3 (three) times daily as needed (for dry/tired eyes.)., Disp: , Rfl:  .  ibuprofen (ADVIL,MOTRIN) 200 MG tablet, Take 400 mg by mouth 2 (two) times daily as needed (for pain relief.)., Disp: , Rfl:  .   levocetirizine (XYZAL) 5 MG tablet, Take 1 tablet (5 mg total) by mouth every evening., Disp: 90 tablet, Rfl: 1 .  Naproxen Sodium (ALEVE PO), Take by mouth daily after breakfast., Disp: , Rfl:  .  Omega-3 Fatty Acids (FISH OIL TRIPLE STRENGTH) 1400 MG CAPS, Take 1,400 mg by mouth daily at 2 PM. , Disp: , Rfl:  .  omeprazole (PRILOSEC) 20 MG capsule, Take 1 capsule (20 mg total) by mouth 2 (two) times daily before a meal. Please schedule with a new provider for further refills, Disp: 180 capsule, Rfl: 1 .  potassium chloride SA (K-DUR) 20 MEQ tablet, Take 1 tablet (20 mEq total) by mouth daily., Disp: 90 tablet, Rfl: 1 .  sildenafil (VIAGRA) 100 MG tablet, Take 1 tablet (100 mg total) by mouth daily as needed. (Patient taking differently: Take 100 mg by mouth daily as needed for erectile dysfunction. ), Disp: 10 tablet, Rfl: 11 .  hydrochlorothiazide (HYDRODIURIL) 25 MG tablet, Take 1 tablet (25 mg total) by mouth daily., Disp: 90 tablet, Rfl: 1 .  losartan (COZAAR) 100 MG tablet, Take 1 tablet (100 mg total) by mouth daily., Disp: 90 tablet, Rfl: 1  Review of Systems:  Constitutional: Denies fever, chills, diaphoresis, appetite change and fatigue.  HEENT: Denies photophobia, eye pain, redness, hearing loss, ear pain, congestion, sore throat, rhinorrhea, sneezing, mouth sores, trouble swallowing, neck pain, neck stiffness and tinnitus.   Respiratory: Denies SOB, DOE, cough, chest tightness,  and wheezing.   Cardiovascular: Denies chest pain, palpitations and leg swelling.  Gastrointestinal: Denies nausea, vomiting, abdominal pain, diarrhea, constipation, blood in stool and abdominal distention.  Genitourinary: Denies dysuria, urgency, frequency, hematuria, flank pain and difficulty urinating.  Endocrine: Denies: hot or cold intolerance, sweats, changes in hair or nails, polyuria, polydipsia. Musculoskeletal: Denies myalgias, back pain, joint swelling, arthralgias and gait problem.  Skin: Denies  pallor, rash and wound.  Neurological: Denies dizziness, seizures, syncope, weakness, light-headedness, numbness and headaches.  Hematological: Denies adenopathy. Easy bruising, personal or family bleeding history  Psychiatric/Behavioral: Denies suicidal ideation, mood changes, confusion, nervousness, sleep disturbance and agitation    Physical Exam: Vitals:   07/18/19 0838  BP: 140/80  Pulse: 77  Temp: 98.2 F (36.8 C)  TempSrc: Oral  SpO2: 97%  Weight: 190 lb 9.6 oz (86.5 kg)  Height: 5' 10.5" (1.791 m)    Body mass index is 26.96 kg/m.   Constitutional: NAD, calm, comfortable Eyes: PERRL, lids and conjunctivae normal, wears corrective lenses ENMT: Mucous membranes are moist. Posterior pharynx clear of any exudate or lesions. Normal dentition. Tympanic membrane is pearly white, no erythema or bulging. Neck: normal, supple, no masses, no thyromegaly Respiratory: clear to auscultation bilaterally, no wheezing, no crackles. Normal respiratory effort. No accessory muscle use.  Cardiovascular: Regular rate and rhythm, no murmurs / rubs / gallops. No extremity edema.  2+ pedal pulses. No carotid bruits.  Abdomen: no tenderness, no masses palpated. No hepatosplenomegaly. Bowel sounds positive.  Musculoskeletal: no clubbing / cyanosis. No joint deformity upper and lower extremities. Good ROM, no contractures. Normal muscle tone.  Skin: no rashes, lesions, ulcers. No induration Neurologic: CN 2-12 grossly intact. Sensation intact, DTR normal. Strength 5/5 in all 4.  Psychiatric: Normal judgment and insight. Alert and oriented x 3. Normal mood.   Subsequent Medicare wellness visit   1. Risk factors, based on past  M,S,F -cardiovascular disease risk factors include age, gender, history of hypertension, history of hyperlipidemia   2.  Physical activities: He remains quite physically active, walks on the treadmill 3 times a week and is very active in his garden.   3.  Depression/mood:   Mood is stable, not depressed   4.  Hearing:  No issues   5.  ADL's: Independent in all ADLs   6.  Fall risk:  Low fall risk   7.  Home safety: No problems identified   8.  Height weight, and visual acuity: Height and weight as above, visual acuity is 20/15 on the left, 20/15 on the right and 20/15 with both eyes   9.  Counseling:  Advised routine follow-up as scheduled with his urologist   10. Lab orders based on risk factors: Laboratory update will be reviewed   11. Referral :  None today   12. Care plan:  Return in 8 weeks for blood pressure follow-up   13. Cognitive assessment:  No cognitive impairment   14. Screening: Patient provided with a written and personalized 5-10 year screening schedule in the AVS.   yes   15. Provider List Update:   PCP, urology Dr. Alinda Money, ophthalmology (unknown provider)  16. Advance Directives: Full code     Office Visit from 07/18/2019 in [facility]  PHQ-9 Total Score  0      Fall Risk  07/18/2019 05/02/2019 12/07/2016 09/17/2016 01/14/2015  Falls in the past year? 0 0 No Yes No  Number falls in past yr: 0 0 - 1 -  Injury with Fall? 0 0 - No -  Risk for fall due to : - - - Other (Comment) -  Follow up - - - Falls evaluation completed;Education provided;Falls prevention discussed -     Impression and Plan:  Encounter for preventive health examination -Has routine eye and dental care. -All immunizations are up-to-date. -Healthy lifestyle has been discussed in detail. -Screening labs to be performed today. -Had a colonoscopy in 2016 and is a 10-year callback. -Has a history of prostate cancer and follows routinely with urology.  He tells me he is scheduled to have a repeat biopsy in September.  Essential hypertension  -Not well controlled. -He is on amlodipine 10 mg, losartan 100 mg and hydrochlorothiazide 12.5 mg. -Will increase hydrochlorothiazide to 25 mg and he will return in 8 weeks for blood pressure  follow-up.  Prostate cancer ([place])  -Advised continued follow-up with neurology.  ERECTILE DYSFUNCTION, MILD  -Uses phosphodiesterase inhibitors as needed, has not requested refills today.  Gastroesophageal reflux disease without esophagitis  -This is currently an issue for him. -Will try increasing omeprazole 20 mg from once to twice daily and follow-up. -He had an upper endoscopy in 2016 with no evidence for peptic ulcer disease.  Hyperlipidemia, unspecified hyperlipidemia type -Last LDL was 173 in February 2019. -If still elevated I would consider starting a statin given his cardiovascular disease risk profile.  Patient Instructions  -Nice seeing you today!!  -Lab work today; will notify you once results are available.  -STOP combination blood pressure pill.  -Start Losartan 100 mg daily and HCTZ 25 mg daily.  -Schedule follow up in 8 weeks for blood pressure check.   Preventive Care 72 Years and Older, Male Preventive care refers to lifestyle choices and visits with your health care provider that can promote health and wellness. This includes:  A yearly physical exam. This is also called an annual well check.  Regular dental and eye exams.  Immunizations.  Screening for certain conditions.  Healthy lifestyle choices, such as diet and exercise. What can I expect for my preventive care visit? Physical exam Your health care provider will check:  Height and weight. These may be used to calculate body mass index (BMI), which is a measurement that tells if you are at a healthy weight.  Heart rate and blood pressure.  Your skin for abnormal spots. Counseling Your health care provider may ask you questions about:  Alcohol, tobacco, and drug use.  Emotional well-being.  Home and relationship well-being.  Sexual activity.  Eating habits.  History of falls.  Memory and ability to understand (cognition).  Work and work Statistician. What immunizations do  I need?  Influenza (flu) vaccine  This is recommended every year. Tetanus, diphtheria, and pertussis (Tdap) vaccine  You may need a Td booster every 10 years. Varicella (chickenpox) vaccine  You may need this vaccine if you have not already been vaccinated. Zoster (shingles) vaccine  You may need this after age 63. Pneumococcal conjugate (PCV13) vaccine  One dose is recommended after age 61. Pneumococcal polysaccharide (PPSV23) vaccine  One dose is recommended after age 30. Measles, mumps, and rubella (MMR) vaccine  You may need at least one dose of MMR if you were born in 1957 or later. You may also need a second dose. Meningococcal conjugate (MenACWY) vaccine  You may need this if you have certain conditions. Hepatitis A vaccine  You may need this if you have certain conditions or if you travel or work in places where you may be exposed to hepatitis A. Hepatitis B vaccine  You may need this if you have certain conditions or if you travel or work in places where you may be exposed to hepatitis B. Haemophilus influenzae type b (Hib) vaccine  You may need this if you have certain conditions. You may receive vaccines as individual doses or as more than one vaccine together in one shot (combination vaccines). Talk with your health care provider about the risks and benefits of combination vaccines. What tests do I need? Blood tests  Lipid and cholesterol levels. These may be checked every 5 years, or more frequently depending on your overall health.  Hepatitis C test.  Hepatitis B test. Screening  Lung cancer screening. You may have this screening every year starting at age 57 if you have a 30-pack-year history of smoking and currently smoke or have quit within the past 15 years.  Colorectal cancer screening. All adults should have this screening starting at age 41 and continuing until age 17. Your health care provider may recommend screening at age 24 if you are at  increased risk. You will have tests every 1-10 years, depending on your results and the type of screening test.  Prostate cancer screening. Recommendations will vary depending on your family history and other risks.  Diabetes screening. This is done by checking your blood sugar (glucose) after you  have not eaten for a while (fasting). You may have this done every 1-3 years.  Abdominal aortic aneurysm (AAA) screening. You may need this if you are a current or former smoker.  Sexually transmitted disease (STD) testing. Follow these instructions at home: Eating and drinking  Eat a diet that includes fresh fruits and vegetables, whole grains, lean protein, and low-fat dairy products. Limit your intake of foods with high amounts of sugar, saturated fats, and salt.  Take vitamin and mineral supplements as recommended by your health care provider.  Do not drink alcohol if your health care provider tells you not to drink.  If you drink alcohol: ? Limit how much you have to 0-2 drinks a day. ? Be aware of how much alcohol is in your drink. In the [place] one drink equals one 12 oz bottle of beer (355 mL), one 5 oz glass of wine (148 mL), or one 1 oz glass of hard liquor (44 mL). Lifestyle  Take daily care of your teeth and gums.  Stay active. Exercise for at least 30 minutes on 5 or more days each week.  Do not use any products that contain nicotine or tobacco, such as cigarettes, e-cigarettes, and chewing tobacco. If you need help quitting, ask your health care provider.  If you are sexually active, practice safe sex. Use a condom or other form of protection to prevent STIs (sexually transmitted infections).  Talk with your health care provider about taking a low-dose aspirin or statin. What's next?  Visit your health care provider once a year for a well check visit.  Ask your health care provider how often you should have your eyes and teeth checked.  Stay up to date on all vaccines.  This information is not intended to replace advice given to you by your health care provider. Make sure you discuss any questions you have with your health care provider. Document Released: 01/09/2016 Document Revised: 12/07/2018 Document Reviewed: 12/07/2018 Elsevier Patient Education  2020 [place], [place] [facility]

## 2019-08-18 ENCOUNTER — Emergency Department (HOSPITAL_BASED_OUTPATIENT_CLINIC_OR_DEPARTMENT_OTHER): Payer: Medicare Other

## 2019-08-18 ENCOUNTER — Emergency Department (HOSPITAL_BASED_OUTPATIENT_CLINIC_OR_DEPARTMENT_OTHER)
Admission: EM | Admit: 2019-08-18 | Discharge: 2019-08-18 | Disposition: A | Discharge status: Home / Self Care | Payer: Medicare Other | Attending: Emergency Medicine | Admitting: Emergency Medicine

## 2019-08-18 ENCOUNTER — Encounter (HOSPITAL_BASED_OUTPATIENT_CLINIC_OR_DEPARTMENT_OTHER): Payer: Self-pay | Admitting: Emergency Medicine

## 2019-08-18 ENCOUNTER — Other Ambulatory Visit: Payer: Self-pay

## 2019-08-18 DIAGNOSIS — Z7982 Long term (current) use of aspirin: Secondary | ICD-10-CM | POA: Diagnosis not present

## 2019-08-18 DIAGNOSIS — Z79899 Other long term (current) drug therapy: Secondary | ICD-10-CM | POA: Insufficient documentation

## 2019-08-18 DIAGNOSIS — R110 Nausea: Secondary | ICD-10-CM | POA: Diagnosis not present

## 2019-08-18 DIAGNOSIS — Z8546 Personal history of malignant neoplasm of prostate: Secondary | ICD-10-CM | POA: Insufficient documentation

## 2019-08-18 DIAGNOSIS — R079 Chest pain, unspecified: Secondary | ICD-10-CM | POA: Insufficient documentation

## 2019-08-18 LAB — BASIC METABOLIC PANEL
Anion gap: 12 (ref 5–15)
BUN: 18 mg/dL (ref 8–23)
CO2: 27 mmol/L (ref 22–32)
Calcium: 9.3 mg/dL (ref 8.9–10.3)
Chloride: 99 mmol/L (ref 98–111)
Creatinine, Ser: 1.01 mg/dL (ref 0.61–1.24)
GFR calc Af Amer: >60 mL/min (ref >60)
GFR calc non Af Amer: >60 mL/min (ref >60)
Glucose, Bld: 108 mg/dL — ABNORMAL HIGH (ref 70–99)
Potassium: 3.2 mmol/L — ABNORMAL LOW (ref 3.5–5.1)
Sodium: 138 mmol/L (ref 135–145)

## 2019-08-18 LAB — CBC
HCT: 43.1 % (ref 39.0–52.0)
Hemoglobin: 14.7 g/dL (ref 13.0–17.0)
MCH: 29.4 pg (ref 26.0–34.0)
MCHC: 34.1 g/dL (ref 30.0–36.0)
MCV: 86.2 fL (ref 80.0–100.0)
Platelets: 173 10*3/uL (ref 150–400)
RBC: 5 MIL/uL (ref 4.22–5.81)
RDW: 11.8 % (ref 11.5–15.5)
WBC: 7.2 10*3/uL (ref 4.0–10.5)
nRBC: 0 % (ref 0.0–0.2)

## 2019-08-18 LAB — HEPATIC FUNCTION PANEL
ALT: 21 U/L (ref 0–44)
AST: 21 U/L (ref 15–41)
Albumin: 4.7 g/dL (ref 3.5–5.0)
Alkaline Phosphatase: 73 U/L (ref 38–126)
Bilirubin, Direct: 0.1 mg/dL (ref 0.0–0.2)
Indirect Bilirubin: 0.7 mg/dL (ref 0.3–0.9)
Total Bilirubin: 0.8 mg/dL (ref 0.3–1.2)
Total Protein: 8 g/dL (ref 6.5–8.1)

## 2019-08-18 LAB — TROPONIN I (HIGH SENSITIVITY): Troponin I (High Sensitivity): 3 ng/L (ref <18)

## 2019-08-18 LAB — LIPASE, BLOOD: Lipase: 24 U/L (ref 11–51)

## 2019-08-18 MED ORDER — ONDANSETRON 4 MG PO TBDP: ORAL_TABLET | ORAL | 0 refills | Status: DC

## 2019-08-18 MED ORDER — ONDANSETRON HCL 4 MG/2ML IJ SOLN
4.0000 mg | Freq: Once | INTRAMUSCULAR | Status: AC
  Administered 2019-08-18: 4 mg via INTRAVENOUS
  Filled 2019-08-18: qty 2

## 2019-08-18 MED ORDER — ALUM & MAG HYDROXIDE-SIMETH 200-200-20 MG/5ML PO SUSP
30.0000 mL | Freq: Once | ORAL | Status: AC
  Administered 2019-08-18: 30 mL via ORAL
  Filled 2019-08-18: qty 30

## 2019-08-18 MED ORDER — SODIUM CHLORIDE 0.9 % IV BOLUS
1000.0000 mL | Freq: Once | INTRAVENOUS | Status: AC
  Administered 2019-08-18: 1000 mL via INTRAVENOUS

## 2019-08-18 NOTE — ED Provider Notes (Signed)
[facility] EMERGENCY DEPARTMENT Provider Note   CSN: [id number] Arrival date & time: 08/18/19  1640     History   Chief Complaint Chief Complaint  Patient presents with  . Nausea  . Chest Pain    HPI Antonio Newton is a 73 y.o. male.     74 yo M with a chief complaints of an upset stomach.  This is been going on for at least 3 weeks if not longer.  At one point the patient mentioned that this is been going on for at least 3 months.  States that every now and again he will get this issue where he feels that his stomach is really sick and he typically drinks ginger ale or tea with ginger in it and it eventually gets better.  Has had a poor appetite but has been able to eat off and on.  His family doctor increased his Protonix to twice daily about 3 to 4 months ago without significant improvement.  He felt that his symptoms had worsened today and he had been having muscular spasms off and on and once or twice went into his chest about 8 or so hours ago.  He became concerned that he was having may be a heart attack or stroke and so came to the ED for evaluation.  Denies exertional symptoms denies diaphoresis denies vomiting.  Has been having loose stools off and on for at least a week.  Describes them as dark but not darker than normal.  The history is provided by the patient and the spouse.  Chest Pain Associated symptoms: nausea   Associated symptoms: no abdominal pain, no fever, no headache, no palpitations, no shortness of breath and no vomiting   Illness Severity:  Moderate Onset quality:  Gradual Duration:  4 months Timing:  Constant Progression:  Unchanged Chronicity:  New Associated symptoms: chest pain and nausea   Associated symptoms: no abdominal pain, no congestion, no diarrhea, no fever, no headaches, no myalgias, no rash, no shortness of breath and no vomiting     Past Medical History:  Diagnosis Date  . Allergy   . Anxiety   . Cancer [facility])    prostate- Dr. Vickey Sages waiting"  . ED (erectile dysfunction)   . Heart murmur    pt thinks he had a murmur many years ago.  Not sure now.maw  . History of hiatal hernia    inguninal  . Hypertension   . Incarcerated right inguinal hernia 12/23/2016  . Neuromuscular disorder (HCC)    neuropathy - mildly in bil hands-is improved since neck surgery  . Neuropathy   . OA (osteoarthritis)    left hip  . Pneumonia     Patient Active Problem List   Diagnosis Date Noted  . GERD (gastroesophageal reflux disease) 07/18/2019  . Hyperlipidemia 07/18/2019  . Seasonal allergic rhinitis due to pollen 05/09/2018  . Routine general medical examination at a health care facility 02/27/2018  . S/P cervical spinal fusion 09/17/2016  . Prostate cancer ([place]) 01/14/2015  . Cough secondary to angiotensin converting enzyme inhibitor (ACE-I) 02/05/2014  . Low testosterone 12/06/2012  . ERECTILE DYSFUNCTION, MILD 01/17/2009  . Essential hypertension 01/17/2009  . Osteoarthritis 01/17/2009    Past Surgical History:  Procedure Laterality Date  . ANTERIOR CERVICAL DECOMP/DISCECTOMY FUSION N/A 02/06/2016   Procedure: Cevical three-four, Cervical four-five, Cervical five-six anterior cervical decompression with fusion interbody prosthesis plating and bonegraft;  Surgeon: Consuella Lose, MD;  Location: [facility];  Service:  Neurosurgery;  Laterality: N/A;  . COLONOSCOPY    . INGUINAL HERNIA REPAIR Right 12/23/2016   Procedure: OPEN REPAIR RIGHT INGUINAL HERNIA WITH MESH;  Surgeon: Fanny Skates, MD;  Location: [facility] ORS;  Service: General;  Laterality: Right;  . INSERTION OF MESH Right 12/23/2016   Procedure: INSERTION OF MESH;  Surgeon: Fanny Skates, MD;  Location: [facility] ORS;  Service: General;  Laterality: Right;  . None    . postate biopsy          Home Medications    Prior to Admission medications   Medication Sig Start Date End Date Taking? Authorizing Provider  amLODipine  (NORVASC) 10 MG tablet Take 1 tablet (10 mg total) by mouth daily. 05/02/19   Isaac Bliss, Rayford Halsted, MD  aspirin EC 81 MG tablet Take 81 mg by mouth daily.    [provider]  fluticasone (FLONASE) 50 MCG/ACT nasal spray Place 2 sprays into both nostrils daily. 02/27/18   Dorena Cookey, MD  gabapentin (NEURONTIN) 300 MG capsule TAKE 1 CAPSULE BY MOUTH TWO TIMES DAILY 03/30/19   Isaac Bliss, Rayford Halsted, MD  Glycerin-Hypromellose-PEG 400 (VISINE TIRED EYE RELIEF) 0.2-0.36-1 % SOLN Place 1-2 drops into both eyes 3 (three) times daily as needed (for dry/tired eyes.).    [provider]  hydrochlorothiazide (HYDRODIURIL) 25 MG tablet Take 1 tablet (25 mg total) by mouth daily. 07/18/19   Isaac Bliss, Rayford Halsted, MD  ibuprofen (ADVIL,MOTRIN) 200 MG tablet Take 400 mg by mouth 2 (two) times daily as needed (for pain relief.).    [provider]  levocetirizine (XYZAL) 5 MG tablet Take 1 tablet (5 mg total) by mouth every evening. 05/23/19   Isaac Bliss, Rayford Halsted, MD  losartan (COZAAR) 100 MG tablet Take 1 tablet (100 mg total) by mouth daily. 07/18/19   Isaac Bliss, Rayford Halsted, MD  Naproxen Sodium (ALEVE PO) Take by mouth daily after breakfast.    [provider]  Omega-3 Fatty Acids (FISH OIL TRIPLE STRENGTH) 1400 MG CAPS Take 1,400 mg by mouth daily at 2 PM.     [provider]  omeprazole (PRILOSEC) 20 MG capsule Take 1 capsule (20 mg total) by mouth 2 (two) times daily before a meal. Please schedule with a new provider for further refills 05/15/19   Isaac Bliss, Rayford Halsted, MD  ondansetron (ZOFRAN ODT) 4 MG disintegrating tablet 4mg  ODT q4 hours prn nausea/vomit 08/18/19   Deno Etienne, DO  potassium chloride SA (K-DUR) 20 MEQ tablet Take 1 tablet (20 mEq total) by mouth daily. 05/02/19   Isaac Bliss, Rayford Halsted, MD  sildenafil (VIAGRA) 100 MG tablet Take 1 tablet (100 mg total) by mouth daily as needed. Patient taking differently: Take 100 mg by  mouth daily as needed for erectile dysfunction.  01/14/15   Dorena Cookey, MD    Family History Family History  Problem Relation Age of Onset  . Hypertension Father        Deceased, 62  . Hypertension Mother        Deceased, 30  . Healthy Sister   . Esophageal cancer Paternal Grandfather   . Colon cancer Neg Hx   . Rectal cancer Neg Hx   . Stomach cancer Neg Hx     Social History Social History   Tobacco Use  . Smoking status: Never Smoker  . Smokeless tobacco: Never Used  Substance Use Topics  . Alcohol use: Yes    Alcohol/week: 0.0 standard drinks  Comment: Occasionally (few times per month)  . Drug use: No     Allergies   Penicillins   Review of Systems Review of Systems  Constitutional: Negative for chills and fever.  HENT: Negative for congestion and facial swelling.   Eyes: Negative for discharge and visual disturbance.  Respiratory: Negative for shortness of breath.   Cardiovascular: Positive for chest pain. Negative for palpitations.  Gastrointestinal: Positive for nausea. Negative for abdominal pain, diarrhea and vomiting.  Musculoskeletal: Negative for arthralgias and myalgias.  Skin: Negative for color change and rash.  Neurological: Negative for tremors, syncope and headaches.  Psychiatric/Behavioral: Negative for confusion and dysphoric mood.     Physical Exam Updated Vital Signs BP (!) 169/92   Pulse 76   Temp 98.4 F (36.9 C) (Oral)   Resp 18   Ht 6' (1.829 m)   Wt 86.2 kg   SpO2 98%   BMI 25.77 kg/m   Physical Exam Vitals signs and nursing note reviewed.  Constitutional:      Appearance: He is well-developed.  HENT:     Head: Normocephalic and atraumatic.     Mouth/Throat:   Eyes:     Pupils: Pupils are equal, round, and reactive to light.  Neck:     Musculoskeletal: Normal range of motion and neck supple.     Vascular: No JVD.  Cardiovascular:     Rate and Rhythm: Normal rate and regular rhythm.     Heart sounds: No  murmur. No friction rub. No gallop.   Pulmonary:     Effort: No respiratory distress.     Breath sounds: No wheezing.  Abdominal:     General: There is no distension.     Tenderness: There is no guarding or rebound.  Musculoskeletal: Normal range of motion.  Skin:    Coloration: Skin is not pale.     Findings: No rash.  Neurological:     Mental Status: He is alert and oriented to person, place, and time.  Psychiatric:        Behavior: Behavior normal.      ED Treatments / Results  Labs (all labs ordered are listed, but only abnormal results are displayed) Labs Reviewed  BASIC METABOLIC PANEL - Abnormal; Notable for the following components:      Result Value   Potassium 3.2 (*)    Glucose, Bld 108 (*)    All other components within normal limits  CBC  HEPATIC FUNCTION PANEL  LIPASE, BLOOD  TROPONIN I (HIGH SENSITIVITY)  TROPONIN I (HIGH SENSITIVITY)    EKG EKG Interpretation  Date/Time:  Saturday August 18 2019 17:20:52 EDT Ventricular Rate:  77 PR Interval:  186 QRS Duration: 86 QT Interval:  376 QTC Calculation: 425 R Axis:   30 Text Interpretation:  Normal sinus rhythm Minimal voltage criteria for LVH, may be normal variant Borderline ECG No significant change since last tracing Confirmed by Deno Etienne [phone]) on 08/18/2019 5:24:53 PM   Radiology Dg Chest 2 View  Result Date: 08/18/2019 CLINICAL DATA:  Chest pain EXAM: CHEST - 2 VIEW COMPARISON:  None. FINDINGS: Heart and mediastinal contours are within normal limits. No focal opacities or effusions. No acute bony abnormality. IMPRESSION: No active cardiopulmonary disease. Electronically Signed   By: Rolm Baptise M.D.   On: 08/18/2019 18:14    Procedures Procedures (including critical care time)  Medications Ordered in ED Medications  sodium chloride 0.9 % bolus 1,000 mL (1,000 mLs Intravenous New Bag/Given 08/18/19 1833)  ondansetron (ZOFRAN)  injection 4 mg (4 mg Intravenous Given 08/18/19 1833)  alum &  mag hydroxide-simeth (MAALOX/MYLANTA) 200-200-20 MG/5ML suspension 30 mL (30 mLs Oral Given 08/18/19 1833)     Initial Impression / Assessment and Plan / ED Course  I have reviewed the triage vital signs and the nursing notes.  Pertinent labs & imaging results that were available during my care of the patient were reviewed by me and considered in my medical decision making (see chart for details).        73 yo M with a chief complaints of nausea.  He describes himself as having an upset stomach that is been off and on for at least 3 to 4 months.  He is on Protonix twice daily without improvement.  Had a significant episode today that concerned that maybe was having a heart attack and so came to the ED.  Not complaining of any chest pain.  Described it as a spasm that went to the chest that lasted for about a second.  I will obtain a laboratory evaluation.  Chest x-ray and troponin were ordered in triage.  I do not feel that he will require a second line if the first 1 is negative.  Give a bolus of IV fluids Zofran and a GI cocktail.  The patient's history is most consistent with reflux disease.  Since he has been on Protonix twice daily for about 4 months then he likely needs to follow-up with his family doctor and they may decide to refer him to GI.  He also has bleeding at the gumline of the left lower second molar.  Irritation versus periodontal disease.  No obvious signs of infection.  Patient's lab work is returned as unremarkable.  His troponin is negative he has had more than 3 hours from the last time he had pain.  Symptoms are also so atypical I do not feel it needs to be repeated.  LFT's are unremarkable his lipase is normal.  I feel this is very unlikely to be hepatitis or pancreatitis.  We will have him follow-up with his family doctor.  Return precautions given.  7:35 PM:  I have discussed the diagnosis/risks/treatment options with the patient and believe the pt to be eligible for  discharge home to follow-up with PCP. We also discussed returning to the ED immediately if new or worsening sx occur. We discussed the sx which are most concerning (e.g., sudden worsening pain, fever, inability to tolerate by mouth) that necessitate immediate return. Medications administered to the patient during their visit and any new prescriptions provided to the patient are listed below.  Medications given during this visit Medications  sodium chloride 0.9 % bolus 1,000 mL (1,000 mLs Intravenous New Bag/Given 08/18/19 1833)  ondansetron (ZOFRAN) injection 4 mg (4 mg Intravenous Given 08/18/19 1833)  alum & mag hydroxide-simeth (MAALOX/MYLANTA) 200-200-20 MG/5ML suspension 30 mL (30 mLs Oral Given 08/18/19 1833)     The patient appears reasonably screen and/or stabilized for discharge and I doubt any other medical condition or other [facility] requiring further screening, evaluation, or treatment in the ED at this time prior to discharge.    Final Clinical Impressions(s) / ED Diagnoses   Final diagnoses:  Nausea    ED Discharge Orders         Ordered    ondansetron (ZOFRAN ODT) 4 MG disintegrating tablet     08/18/19 1932           Deno Etienne, DO 08/18/19 1935

## 2019-08-18 NOTE — ED Triage Notes (Addendum)
intermittent nausea x a few weeks. Denies pain. Also concerned about bleeding around a tooth on the L side.   Pt now adds he had some chest pain earlier today.

## 2019-08-18 NOTE — ED Notes (Signed)
Patient transported to X-ray 

## 2019-08-18 NOTE — Discharge Instructions (Signed)
You take the Zofran as needed for nausea.  You can take Maalox as needed for a sick stomach.  Please call your family doctor on Monday and discuss your visit here to the emergency department and see when they want to see you back in the office.  As we discussed about your episodic neck pain you can apply Voltaren gel which is something that you can now buy over-the-counter at the pharmacy.  Please return to the emergency department for exertional symptoms, when you are working in the yard you feel worsening chest pain or shortness of breath.

## 2019-08-20 ENCOUNTER — Encounter: Payer: Self-pay | Admitting: Internal Medicine

## 2019-08-29 NOTE — Telephone Encounter (Signed)
Spoke with patient and appointment scheduled 

## 2019-08-30 ENCOUNTER — Inpatient Hospital Stay: Payer: Medicare Other | Admitting: Internal Medicine

## 2019-08-31 ENCOUNTER — Telehealth (INDEPENDENT_AMBULATORY_CARE_PROVIDER_SITE_OTHER): Payer: Medicare Other | Admitting: Internal Medicine

## 2019-08-31 ENCOUNTER — Encounter: Payer: Self-pay | Admitting: Internal Medicine

## 2019-08-31 ENCOUNTER — Other Ambulatory Visit: Payer: Self-pay

## 2019-08-31 DIAGNOSIS — I10 Essential (primary) hypertension: Secondary | ICD-10-CM

## 2019-08-31 DIAGNOSIS — K219 Gastro-esophageal reflux disease without esophagitis: Secondary | ICD-10-CM

## 2019-08-31 DIAGNOSIS — Z09 Encounter for follow-up examination after completed treatment for conditions other than malignant neoplasm: Secondary | ICD-10-CM | POA: Diagnosis not present

## 2019-08-31 DIAGNOSIS — E876 Hypokalemia: Secondary | ICD-10-CM | POA: Diagnosis not present

## 2019-08-31 NOTE — Progress Notes (Signed)
Virtual Visit via Video Note  I connected with Antonio Newton on 08/31/19 at  2:00 PM EDT by a video enabled telemedicine application and verified that I am speaking with the correct person using two identifiers.  Location patient: home Location provider: work office Persons participating in the virtual visit: patient, provider  I discussed the limitations of evaluation and management by telemedicine and the availability of in person appointments. The patient expressed understanding and agreed to proceed.   HPI: He has scheduled this visit to discuss some acute issues.  1. He was seen in the ED on 8/22 with stomach upset and nausea. We had seen him a few months back for the same and had increased his PPI to BID. He states that has had no effect. He was asked by the ED to take Maalox in addition to BID PPI (this has helped) and to follow up with me. He is not on OTC NSAIDs.  2. States his BP remains high in [id number] range systolic. He is on norvasc 10, losartan 100 and HCTZ 25. Sometimes it is below 130.  3. In the ED he has told K was low. He has always taken KCl 20 meq daily, in March I had asked him to take 2 tabs daily for 3 days when he was found to be hypokalemic. He wants to know what his K and Mg levels are and if ne needs any supplementation.   ROS: Constitutional: Denies fever, chills, diaphoresis, appetite change and fatigue.  HEENT: Denies photophobia, eye pain, redness, hearing loss, ear pain, congestion, sore throat, rhinorrhea, sneezing, mouth sores, trouble swallowing, neck pain, neck stiffness and tinnitus.   Respiratory: Denies SOB, DOE, cough, chest tightness,  and wheezing.   Cardiovascular: Denies chest pain, palpitations and leg swelling.  Gastrointestinal: Denies vomiting, diarrhea, constipation, blood in stool and abdominal distention.  Genitourinary: Denies dysuria, urgency, frequency, hematuria, flank pain and difficulty urinating.  Endocrine: Denies: hot  or cold intolerance, sweats, changes in hair or nails, polyuria, polydipsia. Musculoskeletal: Denies myalgias, back pain, joint swelling, arthralgias and gait problem.  Skin: Denies pallor, rash and wound.  Neurological: Denies dizziness, seizures, syncope, weakness, light-headedness, numbness and headaches.  Hematological: Denies adenopathy. Easy bruising, personal or family bleeding history  Psychiatric/Behavioral: Denies suicidal ideation, mood changes, confusion, nervousness, sleep disturbance and agitation   Past Medical History:  Diagnosis Date  . Allergy   . Anxiety   . Cancer [facility])    prostate- Dr. Vickey Sages waiting"  . ED (erectile dysfunction)   . Heart murmur    pt thinks he had a murmur many years ago.  Not sure now.maw  . History of hiatal hernia    inguninal  . Hypertension   . Incarcerated right inguinal hernia 12/23/2016  . Neuromuscular disorder (HCC)    neuropathy - mildly in bil hands-is improved since neck surgery  . Neuropathy   . OA (osteoarthritis)    left hip  . Pneumonia     Past Surgical History:  Procedure Laterality Date  . ANTERIOR CERVICAL DECOMP/DISCECTOMY FUSION N/A 02/06/2016   Procedure: Cevical three-four, Cervical four-five, Cervical five-six anterior cervical decompression with fusion interbody prosthesis plating and bonegraft;  Surgeon: Consuella Lose, MD;  Location: [facility];  Service: Neurosurgery;  Laterality: N/A;  . COLONOSCOPY    . INGUINAL HERNIA REPAIR Right 12/23/2016   Procedure: OPEN REPAIR RIGHT INGUINAL HERNIA WITH MESH;  Surgeon: Fanny Skates, MD;  Location: [facility];  Service: General;  Laterality: Right;  .  INSERTION OF MESH Right 12/23/2016   Procedure: INSERTION OF MESH;  Surgeon: Fanny Skates, MD;  Location: [facility];  Service: General;  Laterality: Right;  . None    . postate biopsy      Family History  Problem Relation Age of Onset  . Hypertension Father        Deceased, 61  . Hypertension  Mother        Deceased, 23  . Healthy Sister   . Esophageal cancer Paternal Grandfather   . Colon cancer Neg Hx   . Rectal cancer Neg Hx   . Stomach cancer Neg Hx     SOCIAL HX:   reports that he has never smoked. He has never used smokeless tobacco. He reports current alcohol use. He reports that he does not use drugs.   Current Outpatient Medications:  .  amLODipine (NORVASC) 10 MG tablet, Take 1 tablet (10 mg total) by mouth daily., Disp: 90 tablet, Rfl: 1 .  aspirin EC 81 MG tablet, Take 81 mg by mouth daily., Disp: , Rfl:  .  fluticasone (FLONASE) 50 MCG/ACT nasal spray, Place 2 sprays into both nostrils daily., Disp: 16 g, Rfl: 6 .  gabapentin (NEURONTIN) 300 MG capsule, TAKE 1 CAPSULE BY MOUTH TWO TIMES DAILY, Disp: 180 capsule, Rfl: 0 .  Glycerin-Hypromellose-PEG 400 (VISINE TIRED EYE RELIEF) 0.2-0.36-1 % SOLN, Place 1-2 drops into both eyes 3 (three) times daily as needed (for dry/tired eyes.)., Disp: , Rfl:  .  hydrochlorothiazide (HYDRODIURIL) 25 MG tablet, Take 1 tablet (25 mg total) by mouth daily., Disp: 90 tablet, Rfl: 1 .  ibuprofen (ADVIL,MOTRIN) 200 MG tablet, Take 400 mg by mouth 2 (two) times daily as needed (for pain relief.)., Disp: , Rfl:  .  levocetirizine (XYZAL) 5 MG tablet, Take 1 tablet (5 mg total) by mouth every evening., Disp: 90 tablet, Rfl: 1 .  losartan (COZAAR) 100 MG tablet, Take 1 tablet (100 mg total) by mouth daily., Disp: 90 tablet, Rfl: 1 .  Naproxen Sodium (ALEVE PO), Take by mouth daily after breakfast., Disp: , Rfl:  .  Omega-3 Fatty Acids (FISH OIL TRIPLE STRENGTH) 1400 MG CAPS, Take 1,400 mg by mouth daily at 2 PM. , Disp: , Rfl:  .  omeprazole (PRILOSEC) 20 MG capsule, Take 1 capsule (20 mg total) by mouth 2 (two) times daily before a meal. Please schedule with a new provider for further refills, Disp: 180 capsule, Rfl: 1 .  ondansetron (ZOFRAN ODT) 4 MG disintegrating tablet, 4mg  ODT q4 hours prn nausea/vomit, Disp: 20 tablet, Rfl: 0 .   potassium chloride SA (K-DUR) 20 MEQ tablet, Take 1 tablet (20 mEq total) by mouth daily., Disp: 90 tablet, Rfl: 1 .  sildenafil (VIAGRA) 100 MG tablet, Take 1 tablet (100 mg total) by mouth daily as needed. (Patient taking differently: Take 100 mg by mouth daily as needed for erectile dysfunction. ), Disp: 10 tablet, Rfl: 11  EXAM:   VITALS per patient if applicable: BP on average 140/70-150/80.  GENERAL: alert, oriented, appears well and in no acute distress  HEENT: atraumatic, conjunttiva clear, no obvious abnormalities on inspection of external nose and ears, wears corrective lenses.  NECK: normal movements of the head and neck  LUNGS: on inspection no signs of respiratory distress, breathing rate appears normal, no obvious gross increased work of breathing, gasping or wheezing  CV: no obvious cyanosis  MS: moves all visible extremities without noticeable abnormality  PSYCH/NEURO: pleasant and cooperative, no obvious depression or  anxiety, speech and thought processing grossly intact  ASSESSMENT AND PLAN:   Hospital discharge follow-up Gastroesophageal reflux disease without esophagitis -Some improvement with Maalox added to BID PPI. -Will refer to GI. Will likely need EGD.  Essential hypertension -Have asked him to fax over his BP chart so we can determine if we need to escalate his anti-hypertensive therapy.  Hypokalemia  Hypomagnesemia  -He will return early next week for lab draw. I will determine after if he needs further supplementation.     I discussed the assessment and treatment plan with the patient. The patient was provided an opportunity to ask questions and all were answered. The patient agreed with the plan and demonstrated an understanding of the instructions.   The patient was advised to call back or seek an in-person evaluation if the symptoms worsen or if the condition fails to improve as anticipated.    Lelon Frohlich, MD  [facility]

## 2019-09-01 ENCOUNTER — Other Ambulatory Visit: Payer: Self-pay | Admitting: Internal Medicine

## 2019-09-06 ENCOUNTER — Telehealth: Payer: Self-pay | Admitting: Internal Medicine

## 2019-09-06 NOTE — Telephone Encounter (Signed)
Pt called in because he said that his strength for his medication should have been increase to 40 MCQ instead, potassium chloride SA (K-DUR) 20 MEQ tablet.   Pt says that this has caused him to almost be out of his medication. Pt would like further assistance with this.

## 2019-09-07 MED ORDER — POTASSIUM CHLORIDE CRYS ER 20 MEQ PO TBCR: 40.0000 meq | EXTENDED_RELEASE_TABLET | Freq: Every day | ORAL | 0 refills | Status: DC

## 2019-09-07 NOTE — Telephone Encounter (Signed)
Chart reviewed for correct Potassium Chloride dose;   07/18/19 CMP showed K+ 3.3; pt. Advised to take an extra Potassium tablet for 3 days.  08/31/19: PCP ordered to repeat labs to determine if needed any supplementation of potassium.  09/04/19: Potassium Chloride SA 20 mEQ, take 1 tablet daily; #90, RFx 1 given.  Pt. Called to ask about getting the Potassium Chloride 40 mEQ. Strength.  Will route to PCP to review/ advise.

## 2019-09-07 NOTE — Telephone Encounter (Signed)
Patient is calling to check on the status of his Potassium Chloride 40MCQ. Instead of 20MCQ.  Can this be called in to [facility]. Please.  The patient will run out of medication on Sunday.   The patient is asking if a weeks worth can be called into his local [place].  Patient is requesting a call back to ensure that this is called in to his mail order and local pharmacy.   CB- [phone]

## 2019-09-07 NOTE — Addendum Note (Signed)
Addended by: Westley Hummer B on: 09/07/2019 05:20 PM   Modules accepted: Orders

## 2019-09-10 ENCOUNTER — Other Ambulatory Visit: Payer: Self-pay

## 2019-09-10 ENCOUNTER — Other Ambulatory Visit (INDEPENDENT_AMBULATORY_CARE_PROVIDER_SITE_OTHER): Payer: Medicare Other

## 2019-09-10 DIAGNOSIS — E876 Hypokalemia: Secondary | ICD-10-CM

## 2019-09-10 LAB — BASIC METABOLIC PANEL
BUN: 21 mg/dL (ref 6–23)
CO2: 30 mEq/L (ref 19–32)
Calcium: 9.5 mg/dL (ref 8.4–10.5)
Chloride: 98 mEq/L (ref 96–112)
Creatinine, Ser: 1.15 mg/dL (ref 0.40–1.50)
GFR: 75.48 mL/min (ref >60.00)
Glucose, Bld: 115 mg/dL — ABNORMAL HIGH (ref 70–99)
Potassium: 3.3 mEq/L — ABNORMAL LOW (ref 3.5–5.1)
Sodium: 140 mEq/L (ref 135–145)

## 2019-09-10 LAB — MAGNESIUM: Magnesium: 1.8 mg/dL (ref 1.5–2.5)

## 2019-09-12 ENCOUNTER — Other Ambulatory Visit: Payer: Self-pay | Admitting: Internal Medicine

## 2019-09-12 ENCOUNTER — Encounter: Payer: Self-pay | Admitting: Internal Medicine

## 2019-09-12 DIAGNOSIS — E876 Hypokalemia: Secondary | ICD-10-CM | POA: Insufficient documentation

## 2019-09-12 MED ORDER — MAGNESIUM OXIDE 400 MG PO CAPS: 400.0000 mg | ORAL_CAPSULE | Freq: Three times a day (TID) | ORAL | 0 refills | Status: AC

## 2019-09-13 ENCOUNTER — Other Ambulatory Visit: Payer: Self-pay | Admitting: *Deleted

## 2019-09-13 ENCOUNTER — Encounter: Payer: Self-pay | Admitting: *Deleted

## 2019-09-13 ENCOUNTER — Inpatient Hospital Stay: Payer: Medicare Other | Admitting: Internal Medicine

## 2019-09-13 ENCOUNTER — Encounter: Payer: Self-pay | Admitting: Internal Medicine

## 2019-09-13 MED ORDER — POTASSIUM CHLORIDE CRYS ER 20 MEQ PO TBCR: EXTENDED_RELEASE_TABLET | ORAL | 1 refills | Status: DC

## 2019-09-13 NOTE — Telephone Encounter (Signed)
Pt is requesting 90 day supply of potassium chloride SA (K-DUR) 40 MEQ tablet be sent to:   [place], [facility] [phone] (Phone) [phone] (Fax)

## 2019-09-13 NOTE — Telephone Encounter (Signed)
Spoke with patient and reviewed lab results.  New Rx for K sent to [facility]

## 2019-09-13 NOTE — Patient Instructions (Signed)
Magnesium Test Why am I having this test? A magnesium test is done to determine how much magnesium you have in your blood. You may have this test if you:  Are a pregnant woman and you have signs of high blood pressure (preeclampsia).  Have diabetes (diabetes mellitus).  Have a history of alcohol abuse.  Have recently had surgery and are unable to eat normally.  Have a disorder of the thyroid or parathyroid gland.  Use antacid medicines frequently.  Have long-term (chronic) kidney disease.  Have levels of potassium and calcium that are regularly (chronically) low.  Lack certain nutrients in your diet (malnourishment). What is being tested? This test measures the amount of magnesium in your blood. Magnesium is a mineral that helps with many processes in the body, including nerve and heart function. It helps regulate your heartbeat and blood pressure. Your potassium and calcium levels may also be tested because blood magnesium levels are closely related to the levels of these other minerals. What kind of sample is taken?  A blood sample is required for this test. It is usually collected by inserting a needle into a blood vessel. How are the results reported? Your test results will be reported as a value that indicates how much magnesium is in your blood. Your health care provider will compare your results to normal ranges that were established after testing a large group of people (reference ranges). Reference ranges may vary among labs and hospitals. For this test, common normal reference ranges are:  Adult: 1.3-2.1 mEq/L or 0.65-1.05 mmol/L (SI units).  Child: 1.4-1.7 mEq/L.  Newborn: 1.4-2 mEq/L. What do the results mean? Results that are higher than your reference range mean that you have too much magnesium in your blood, which may result from:  Poor kidney function (renal insufficiency).  Addison's disease, also called primary adrenal insufficiency.  Excess use of antacid  medicines.  The thyroid gland not producing enough thyroid hormone (hypothyroidism). Results that are lower than your reference range mean that you have too little magnesium in your blood, which may result from:  Not eating enough nutrients (malnutrition).  Your intestines not absorbing nutrients normally.  The parathyroid glands not producing enough hormones (hypoparathyroidism).  Alcohol abuse.  Chronic kidney disease.  Poorly-controlled diabetes (diabetic ketoacidosis). Talk with your health care provider about what your results mean. Questions to ask your health care provider Ask your health care provider, or the department that is doing the test:  When will my results be ready?  How will I get my results?  What are my treatment options?  What other tests do I need?  What are my next steps? Summary  A magnesium test is done to determine how much magnesium you have in your blood.  Your potassium and calcium levels may also be tested because blood magnesium levels are closely related to the levels of these other minerals.  Talk with your health care provider about what your results mean. Certain medical conditions can cause high or low magnesium levels. This information is not intended to replace advice given to you by your health care provider. Make sure you discuss any questions you have with your health care provider. Document Released: 01/15/2005 Document Revised: 08/15/2017 Document Reviewed: 08/15/2017 Elsevier Patient Education  2020 [employer].  Hypokalemia Hypokalemia means that the amount of potassium in the blood is lower than normal. Potassium is a chemical (electrolyte) that helps regulate the amount of fluid in the body. It also stimulates muscle tightening (contraction) and  helps nerves work properly. Normally, most of the body's potassium is inside cells, and only a very small amount is in the blood. Because the amount in the blood is so small, minor  changes to potassium levels in the blood can be life-threatening. What are the causes? This condition may be caused by:  Antibiotic medicine.  Diarrhea or vomiting. Taking too much of a medicine that helps you have a bowel movement (laxative) can cause diarrhea and lead to hypokalemia.  Chronic kidney disease (CKD).  Medicines that help the body get rid of excess fluid (diuretics).  Eating disorders, such as bulimia.  Low magnesium levels in the body.  Sweating a lot. What are the signs or symptoms? Symptoms of this condition include:  Weakness.  Constipation.  Fatigue.  Muscle cramps.  Mental confusion.  Skipped heartbeats or irregular heartbeat (palpitations).  Tingling or numbness. How is this diagnosed? This condition is diagnosed with a blood test. How is this treated? This condition may be treated by:  Taking potassium supplements by mouth.  Adjusting the medicines that you take.  Eating more foods that contain a lot of potassium. If your potassium level is very low, you may need to get potassium through an IV and be monitored in the hospital. Follow these instructions at home:   Take over-the-counter and prescription medicines only as told by your health care provider. This includes vitamins and supplements.  Eat a healthy diet. A healthy diet includes fresh fruits and vegetables, whole grains, healthy fats, and lean proteins.  If instructed, eat more foods that contain a lot of potassium. This includes: ? Nuts, such as peanuts and pistachios. ? Seeds, such as sunflower seeds and pumpkin seeds. ? Peas, lentils, and lima beans. ? Whole grain and bran cereals and breads. ? Fresh fruits and vegetables, such as apricots, avocado, bananas, cantaloupe, kiwi, oranges, tomatoes, asparagus, and potatoes. ? Orange juice. ? Tomato juice. ? Red meats. ? Yogurt.  Keep all follow-up visits as told by your health care provider. This is important. Contact a  health care provider if you:  Have weakness that gets worse.  Feel your heart pounding or racing.  Vomit.  Have diarrhea.  Have diabetes (diabetes mellitus) and you have trouble keeping your blood sugar (glucose) in your target range. Get help right away if you:  Have chest pain.  Have shortness of breath.  Have vomiting or diarrhea that lasts for more than 2 days.  Faint. Summary  Hypokalemia means that the amount of potassium in the blood is lower than normal.  This condition is diagnosed with a blood test.  Hypokalemia may be treated by taking potassium supplements, adjusting the medicines that you take, or eating more foods that are high in potassium.  If your potassium level is very low, you may need to get potassium through an IV and be monitored in the hospital. This information is not intended to replace advice given to you by your health care provider. Make sure you discuss any questions you have with your health care provider. Document Released: 12/13/2005 Document Revised: 07/26/2018 Document Reviewed: 07/26/2018 Elsevier Patient Education  2020 [employer].

## 2019-09-14 ENCOUNTER — Encounter: Payer: Self-pay | Admitting: *Deleted

## 2019-09-22 ENCOUNTER — Other Ambulatory Visit: Payer: Self-pay

## 2019-09-22 ENCOUNTER — Ambulatory Visit (INDEPENDENT_AMBULATORY_CARE_PROVIDER_SITE_OTHER): Payer: Medicare Other

## 2019-09-22 DIAGNOSIS — Z23 Encounter for immunization: Secondary | ICD-10-CM | POA: Diagnosis not present

## 2019-09-27 ENCOUNTER — Other Ambulatory Visit: Payer: Self-pay | Admitting: Internal Medicine

## 2019-10-04 ENCOUNTER — Ambulatory Visit: Payer: Medicare Other | Admitting: Internal Medicine

## 2019-10-22 ENCOUNTER — Other Ambulatory Visit: Payer: Self-pay | Admitting: Internal Medicine

## 2019-11-14 ENCOUNTER — Ambulatory Visit: Payer: Medicare Other | Admitting: Internal Medicine

## 2019-11-19 ENCOUNTER — Other Ambulatory Visit: Payer: Self-pay | Admitting: Internal Medicine

## 2019-11-19 DIAGNOSIS — I10 Essential (primary) hypertension: Secondary | ICD-10-CM

## 2019-11-21 ENCOUNTER — Other Ambulatory Visit: Payer: Self-pay | Admitting: Internal Medicine

## 2019-11-21 DIAGNOSIS — I10 Essential (primary) hypertension: Secondary | ICD-10-CM

## 2019-12-13 ENCOUNTER — Encounter: Payer: Self-pay | Admitting: *Deleted

## 2020-01-01 ENCOUNTER — Ambulatory Visit
Admission: RE | Admit: 2020-01-01 | Discharge: 2020-01-01 | Disposition: A | Discharge status: Home / Self Care | Payer: Medicare Other | Source: Ambulatory Visit | Attending: Radiation Oncology | Admitting: Radiation Oncology

## 2020-01-01 ENCOUNTER — Encounter: Payer: Self-pay | Admitting: Radiation Oncology

## 2020-01-01 ENCOUNTER — Other Ambulatory Visit: Payer: Self-pay

## 2020-01-01 VITALS — Ht 72.0 in | Wt 192.0 lb

## 2020-01-01 DIAGNOSIS — C61 Malignant neoplasm of prostate: Secondary | ICD-10-CM

## 2020-01-01 HISTORY — DX: Malignant neoplasm of prostate: C61

## 2020-01-01 NOTE — Progress Notes (Signed)
Radiation Oncology         [phone] ________________________________  Initial outpatient Consultation - Conducted via MyChart due to current [id number] concerns for limiting patient exposure  Name: Antonio Newton MRN: [id number]  Date: 01/01/2020  DOB: Sep 20, 1946  [id number] Antonio Beals, MD  Raynelle Bring, MD   REFERRING PHYSICIAN: Raynelle Bring, MD  DIAGNOSIS: 74 y.o. gentleman with Stage T1c adenocarcinoma of the prostate with Gleason score of 3+4, and PSA of 9.59.    ICD-10-CM   1. Malignant neoplasm of prostate ([facility])  C61     HISTORY OF PRESENT ILLNESS: Antonio Newton is a 74 y.o. male with a diagnosis of prostate cancer. He was initially seen by Dr. Risa Grill for an elevated PSA of 6.01. He underwent biopsy on 05/27/2014, which showed Gleason 3+3 prostate cancer in 5% of 3 cores on the right. They opted for active surveillance at that time.  PSA 09/2014 - 5.94 01/2015 - 6.09 12/2015 - 6.65 06/2016 - 6.10 12/2016 - 7.39 06/2017 - 6.32 02/2018 - 7.52 10/2018 - 6.52 04/2019 - 7.92  He had additional surveillance biopsies in 2016 and 2018 which revealed stable Gleason 6 disease with stable PSA in the 6-7 range and DRE remaining without concerning nodularity.  Since Dr. Risa Grill stopped practicing clinically in 2018, he has transitioned his care to Dr. Alinda Money and continued in routine surveillance follow-up.  His most recent PSA on 11/16/2019 was further elevated at 9.59 and a repeat prostate biopsy was performed that same day. The prostate volume measured 65.7 cc.  Out of 12 core biopsies, 4 were positive.  The maximum Gleason score was 3+4, and this was seen in the right apex lateral, right base lateral (small focus), and right apex.  Additionally, Gleason 3+3 was seen in the right mid (small focus).  The patient reviewed the biopsy results with his urologist and he has kindly been referred today for discussion of potential radiation treatment options.  PREVIOUS  RADIATION THERAPY: No  PAST MEDICAL HISTORY:  Past Medical History:  Diagnosis Date  . Allergy   . Anxiety   . ED (erectile dysfunction)   . Heart murmur    pt thinks he had a murmur many years ago.  Not sure now.maw  . History of hiatal hernia    inguninal  . Hypertension   . Incarcerated right inguinal hernia 12/23/2016  . Neuromuscular disorder ([facility])    neuropathy - mildly in bil hands-is improved since neck surgery  . Neuropathy   . OA (osteoarthritis)    left hip  . Pneumonia   . Prostate cancer ([facility])       PAST SURGICAL HISTORY: Past Surgical History:  Procedure Laterality Date  . ANTERIOR CERVICAL DECOMP/DISCECTOMY FUSION N/A 02/06/2016   Procedure: Cevical three-four, Cervical four-five, Cervical five-six anterior cervical decompression with fusion interbody prosthesis plating and bonegraft;  Surgeon: Consuella Lose, MD;  Location: [facility];  Service: Neurosurgery;  Laterality: N/A;  . COLONOSCOPY    . INGUINAL HERNIA REPAIR Right 12/23/2016   Procedure: OPEN REPAIR RIGHT INGUINAL HERNIA WITH MESH;  Surgeon: Fanny Skates, MD;  Location: [facility];  Service: General;  Laterality: Right;  . INSERTION OF MESH Right 12/23/2016   Procedure: INSERTION OF MESH;  Surgeon: Fanny Skates, MD;  Location: [facility];  Service: General;  Laterality: Right;  . None    . postate biopsy      FAMILY HISTORY:  Family History  Problem Relation Age of Onset  . Hypertension Father  Deceased, 30  . Prostate cancer Father   . Hypertension Mother        Deceased, 69  . Renal cancer Mother        mets to lung  . Colon cancer Mother   . Healthy Sister   . Esophageal cancer Paternal Grandfather   . Rectal cancer Neg Hx   . Stomach cancer Neg Hx   . Breast cancer Neg Hx   . Pancreatic cancer Neg Hx     SOCIAL HISTORY:  Social History   Socioeconomic History  . Marital status: Married    Spouse name: Not on file  . Number of children: 3  . Years of education: Not on  file  . Highest education level: Not on file  Occupational History  . Occupation: Realtor    Comment: full time  Tobacco Use  . Smoking status: Never Smoker  . Smokeless tobacco: Never Used  Substance and Sexual Activity  . Alcohol use: Yes    Alcohol/week: 0.0 standard drinks    Comment: Occasionally (few times per month)  . Drug use: No  . Sexual activity: Yes  Other Topics Concern  . Not on file  Social History Narrative   Works as a Nature conservation officer   Lives with wife.  They have three grown children (2 daughters and 1 son).   Social Determinants of Health   Financial Resource Strain:   . Difficulty of Paying Living Expenses: Not on file  Food Insecurity:   . Worried About Charity fundraiser in the Last Year: Not on file  . Ran Out of Food in the Last Year: Not on file  Transportation Needs:   . Lack of Transportation (Medical): Not on file  . Lack of Transportation (Non-Medical): Not on file  Physical Activity:   . Days of Exercise per Week: Not on file  . Minutes of Exercise per Session: Not on file  Stress:   . Feeling of Stress : Not on file  Social Connections:   . Frequency of Communication with Friends and Family: Not on file  . Frequency of Social Gatherings with Friends and Family: Not on file  . Attends Religious Services: Not on file  . Active Member of Clubs or Organizations: Not on file  . Attends Archivist Meetings: Not on file  . Marital Status: Not on file  Intimate Partner Violence:   . Fear of Current or Ex-Partner: Not on file  . Emotionally Abused: Not on file  . Physically Abused: Not on file  . Sexually Abused: Not on file    ALLERGIES: Patient has no known allergies.  MEDICATIONS:  Current Outpatient Medications  Medication Sig Dispense Refill  . amLODipine (NORVASC) 10 MG tablet TAKE 1 TABLET BY MOUTH  DAILY 90 tablet 1  . aspirin EC 81 MG tablet Take 81 mg by mouth daily.    Marland Kitchen gabapentin  (NEURONTIN) 300 MG capsule TAKE 1 CAPSULE BY MOUTH TWO TIMES DAILY 180 capsule 0  . Glycerin-Hypromellose-PEG 400 (VISINE TIRED EYE RELIEF) 0.2-0.36-1 % SOLN Place 1-2 drops into both eyes 3 (three) times daily as needed (for dry/tired eyes.).    Marland Kitchen hydrochlorothiazide (HYDRODIURIL) 25 MG tablet TAKE 1 TABLET BY MOUTH  DAILY 90 tablet 1  . losartan (COZAAR) 100 MG tablet TAKE 1 TABLET BY MOUTH  DAILY 90 tablet 1  . Magnesium 500 MG TABS Take by mouth.    . Multiple Vitamins-Minerals (MULTIVITAMIN ADULT PO) Take by  mouth.    . Naproxen Sodium (ALEVE PO) Take by mouth daily after breakfast.    . Omega-3 Fatty Acids (FISH OIL TRIPLE STRENGTH) 1400 MG CAPS Take 1,400 mg by mouth daily at 2 PM.     . omeprazole (PRILOSEC) 20 MG capsule TAKE 1 CAPSULE BY MOUTH 2  TIMES DAILY BEFORE MEALS 180 capsule 1  . potassium chloride SA (K-DUR) 20 MEQ tablet Take one tab daily 90 tablet 1  . fluticasone (FLONASE) 50 MCG/ACT nasal spray Place 2 sprays into both nostrils daily. (Patient not taking: Reported on 01/01/2020) 16 g 6  . levocetirizine (XYZAL) 5 MG tablet Take 1 tablet (5 mg total) by mouth every evening. (Patient not taking: Reported on 01/01/2020) 90 tablet 1  . ondansetron (ZOFRAN ODT) 4 MG disintegrating tablet 4mg  ODT q4 hours prn nausea/vomit (Patient not taking: Reported on 01/01/2020) 20 tablet 0  . sildenafil (VIAGRA) 100 MG tablet Take 1 tablet (100 mg total) by mouth daily as needed. (Patient not taking: Reported on 01/01/2020) 10 tablet 11   No current facility-administered medications for this encounter.    REVIEW OF SYSTEMS:  On review of systems, the patient reports that he is doing well overall. He denies any chest pain, shortness of breath, cough, fevers, chills, night sweats, unintended weight changes. He denies any bowel disturbances, and denies abdominal pain, nausea or vomiting. He denies any new musculoskeletal or joint aches or pains. His IPSS was 2, indicating mild urinary symptoms. He  reports rare/occasional scant leakage and intermittent constipation. His SHIM was 15 with Viagra, indicating he has moderately controlled erectile dysfunction. A complete review of systems is obtained and is otherwise negative.    PHYSICAL EXAM:  Wt Readings from Last 3 Encounters:  01/01/20 192 lb (87.1 kg)  08/18/19 190 lb (86.2 kg)  07/18/19 190 lb 9.6 oz (86.5 kg)   Temp Readings from Last 3 Encounters:  08/18/19 98.4 F (36.9 C) (Oral)  07/18/19 98.2 F (36.8 C) (Oral)  05/02/19 98.3 F (36.8 C) (Oral)   BP Readings from Last 3 Encounters:  08/18/19 (!) 169/92  07/18/19 140/80  05/02/19 (!) 150/90   Pulse Readings from Last 3 Encounters:  08/18/19 76  07/18/19 77  05/02/19 79   Pain Assessment Pain Score: 0-No pain/10  In general this is a well appearing African American gentleman in no acute distress. He's alert and oriented x4 and appropriate throughout the examination. Cardiopulmonary assessment is negative for acute distress and he exhibits normal effort.    KPS = 90  100 - Normal; no complaints; no evidence of disease. 90   - Able to carry on normal activity; minor signs or symptoms of disease. 80   - Normal activity with effort; some signs or symptoms of disease. 59   - Cares for self; unable to carry on normal activity or to do active work. 60   - Requires occasional assistance, but is able to care for most of his personal needs. 50   - Requires considerable assistance and frequent medical care. 10   - Disabled; requires special care and assistance. 61   - Severely disabled; hospital admission is indicated although death not imminent. 25   - Very sick; hospital admission necessary; active supportive treatment necessary. 10   - Moribund; fatal processes progressing rapidly. 0     - Dead  Karnofsky DA, Abelmann WH, Craver LS and Burchenal [facility] [phone]) The use of the nitrogen mustards in the palliative treatment of carcinoma: with particular  reference to  bronchogenic carcinoma Cancer 1 634-56  LABORATORY DATA:  Lab Results  Component Value Date   WBC 7.2 08/18/2019   HGB 14.7 08/18/2019   HCT 43.1 08/18/2019   MCV 86.2 08/18/2019   PLT 173 08/18/2019   Lab Results  Component Value Date   NA 140 09/10/2019   K 3.3 (L) 09/10/2019   CL 98 09/10/2019   CO2 30 09/10/2019   Lab Results  Component Value Date   ALT 21 08/18/2019   AST 21 08/18/2019   ALKPHOS 73 08/18/2019   BILITOT 0.8 08/18/2019     RADIOGRAPHY: No results found.    IMPRESSION/PLAN: This visit was conducted via MyChart to spare the patient unnecessary potential exposure in the healthcare setting during the current COVID-19 pandemic. 1. 74 y.o. gentleman with Stage T1c adenocarcinoma of the prostate with Gleason Score of 3+4, and PSA of 9.59. We discussed the patient's workup and outlined the nature of prostate cancer in this setting. The patient's T stage, Gleason's score, and PSA put him into the favorable intermediate risk group. Accordingly, he is eligible for a variety of potential treatment options including brachytherapy, 5.5 weeks of external radiation or prostatectomy. We discussed the available radiation techniques, and focused on the details and logistics of delivery. We discussed and outlined the risks, benefits, short and long-term effects associated with radiotherapy and compared and contrasted these with prostatectomy. We discussed the role of SpaceOAR in reducing the rectal toxicity associated with radiotherapy.  He and his family were encouraged to ask questions that were answered to their stated satisfaction.  At the end of the conversation, the patient remains undecided regarding his treatment preference, most interested in either brachytherapy or prostatectomy.  He plans to follow up with Dr. Alinda Money to learn more about his surgical options and once he has all of his questions answered, he will contact us or Dr. Lynne Logan office with his decision so that we  can move forward with treatment planning accordingly.  We enjoyed meeting him and his family today and would be more than happy to continue to participate in his care should he elect to proceed with radiotherapy for treatment of his prostate cancer.  Given current concerns for patient exposure during the COVID-19 pandemic, this encounter was conducted via video-enabled MyChart visit. The patient has given verbal consent for this type of encounter. The time spent during this encounter was 60 minutes. The attendants for this meeting include Tyler Pita MD, Ashlyn Bruning PA-C, Katie Daubenspeck- scribe, patient, Antonio Newton and his wife and daughter. During the encounter, Tyler Pita MD, Ashlyn Bruning PA-C, and scribe, Wilburn Mylar were located at [place].  Patient, Antonio Newton and his wife and daughter were located at home.    Nicholos Johns, PA-C    Tyler Pita, MD  [facility] Direct Dial: [phone]  Fax: [phone] [email or web address]  Skype  [email or web address]  This document serves as a record of services personally performed by Tyler Pita, MD and Freeman Caldron, PA-C. It was created on their behalf by Wilburn Mylar, a trained medical scribe. The creation of this record is based on the scribe's personal observations and the provider's statements to them. This document has been checked and approved by the attending provider.

## 2020-01-01 NOTE — Progress Notes (Signed)
GU Location of Tumor / Histology: prostatic adenocarcinoma  If Prostate Cancer, Gleason Score is (3 + 4) and PSA is (9.59). Prostate volume: 65.7 cc  Derinda Late MATAS LAURENZI was initially diagnosed with prostate cancer in June 2015 by Dr. Risa Grill. Patient noted to have Gleason 3+3=6 in 3 of 12 cores. Patient elected to pursue active surveillance. His most recent PSA increased to 9.59 prompting his most recent biopsy on 11/16/19 indicative of upgraded adenocarcinoma 3+4=7 in 4 out of 12 cores.   Biopsies of prostate (if applicable) revealed:   Past/Anticipated interventions by urology, if any: biopsy, surveillance, biopsy, referral for consideration of radiotherapy.  Past/Anticipated interventions by medical oncology, if any: no  Weight changes, if any: denies  Bowel/Bladder complaints, if any: IPSS 2. SHIM 15 with aid Viagra. Denies dysuria or hematuria. Reports rare occasional scant leakage. Reports intermittent constipation.   Nausea/Vomiting, if any: no  Pain issues, if any:  no  SAFETY ISSUES:  Prior radiation? no  Pacemaker/ICD? no  Possible current pregnancy? no, male  Is the patient on methotrexate? no  Current Complaints / other details:  74 year old male. Married. 3 daughters and 1 son. Works as a Cabin crew.

## 2020-01-07 ENCOUNTER — Telehealth: Payer: Self-pay | Admitting: Medical Oncology

## 2020-01-07 NOTE — Telephone Encounter (Signed)
Spoke with patient to introduce myself as the prostate nurse navigator. He is on his way out the door to another appointment and asked if I can call him tomorrow.

## 2020-01-14 ENCOUNTER — Telehealth: Payer: Self-pay | Admitting: Medical Oncology

## 2020-01-14 NOTE — Telephone Encounter (Signed)
Left a message asking for a return call to discuss treatment decision.

## 2020-01-25 ENCOUNTER — Telehealth: Payer: Self-pay | Admitting: Medical Oncology

## 2020-01-25 NOTE — Telephone Encounter (Signed)
Spoke with patient to introduce myself as the prostate nurse navigator and discuss my role. I have not been able to connect with him but have left messages without a return call. He consulted with Dr.Manning on 1/5 but has not made his treatment decision. He is leaning towards  in brachytherapy but also has interest in surgery. We discussed brachytherapy in detail and all questions were answered. He states he wrote a list of questions down and he feel we have discussed the majority of them, but he would like to find his list and call me back on Monday. I gave him my contact information and I will follow up with him on Monday.

## 2020-01-29 ENCOUNTER — Telehealth: Payer: Self-pay | Admitting: *Deleted

## 2020-01-29 ENCOUNTER — Telehealth: Payer: Self-pay | Admitting: Medical Oncology

## 2020-01-29 NOTE — Telephone Encounter (Signed)
CALLED PATIENT TO ASK QUESTIONS, SPOKE WITH PATIENT 

## 2020-01-29 NOTE — Telephone Encounter (Signed)
Patient called stating he has decided to move forward with brachytherapy to treat his prostate.We discussed the procedure and he is aware Antonio Newton will call him to schedule appointments.

## 2020-01-30 ENCOUNTER — Telehealth: Payer: Self-pay

## 2020-01-30 NOTE — Telephone Encounter (Signed)
Spoke with patient he wishes to add his daughter Antonio Newton [phone] to his list of approved contacts.

## 2020-02-01 ENCOUNTER — Telehealth: Payer: Self-pay | Admitting: *Deleted

## 2020-02-01 NOTE — Telephone Encounter (Signed)
CALLED PATIENT TO UPDATE, SPOKE WITH PATIENT 

## 2020-02-04 ENCOUNTER — Encounter: Payer: Self-pay | Admitting: Internal Medicine

## 2020-02-04 ENCOUNTER — Encounter: Payer: Self-pay | Admitting: Urology

## 2020-02-05 ENCOUNTER — Telehealth: Payer: Self-pay | Admitting: Radiation Oncology

## 2020-02-05 NOTE — Telephone Encounter (Signed)
Received voicemail message from patient requesting Freeman Caldron, PA-C phone him. Phoned patient back to inquire. No answer. Left detailed voicemail message providing my direct number for future questions along with upcoming appointment date and time. Encouraged patient to phone back with additional questions/needs.

## 2020-02-05 NOTE — Telephone Encounter (Signed)
Patient phoned back. Patient questions if his wife can accompany him to his simulation and follow up appointment on 02/14/20 since his "memory isn't very good." Questioned if he could put his wife on speaker phone when necessary so she could be his second set of ears and ask questions. Patient neither confirmed or denied his ability to do this. Patient requested again this RN question if his wife may accompany him. Also, provided patient directions to the cancer center. Patient understands this RN will phone back with providers decision about his wife. Patient verbalized understanding and expressed appreciation for the call back.

## 2020-02-06 ENCOUNTER — Other Ambulatory Visit: Payer: Self-pay | Admitting: Urology

## 2020-02-06 ENCOUNTER — Telehealth: Payer: Self-pay | Admitting: *Deleted

## 2020-02-06 ENCOUNTER — Telehealth: Payer: Self-pay | Admitting: Radiation Oncology

## 2020-02-06 NOTE — Telephone Encounter (Signed)
Called patient to inform of pre-seed appts and implant date, spoke with patient and he is aware of these appts. and this procedure

## 2020-02-06 NOTE — Telephone Encounter (Signed)
Phoned patient back. Explained his wife MAY accompany him to his preseed appointment on 02/14/2020 since his memory isn't very good. Patient verbalized understanding and expressed appreciation for the assistance.

## 2020-02-06 NOTE — Telephone Encounter (Signed)
We are allowing 1 visitor/family member to accompany patients to radiation visits when necessary, due to functional or cognitive status ,which this sounds to be the case.  Visitors are allowed to accompany patient's to visits where they will be meeting with the MD/PA, such as CT SIM and PUTs but not daily treatment visits, without having to ask permission.  If the request is for the visitor to be present daily, that would require special permission from Dr. Lisbeth Renshaw. Antonio Newton

## 2020-02-07 ENCOUNTER — Other Ambulatory Visit: Payer: Self-pay | Admitting: Urology

## 2020-02-07 DIAGNOSIS — C61 Malignant neoplasm of prostate: Secondary | ICD-10-CM

## 2020-02-08 ENCOUNTER — Telehealth: Payer: Self-pay | Admitting: Radiation Oncology

## 2020-02-08 NOTE — Telephone Encounter (Signed)
Received voicemail message from patient requesting return call. Phoned patient back promptly. Patient has a history of spinal stenosis. Patient explains that in recent days he has begun to experience back pain that is keeping him from sleeping. Patient explains this pain presented one week after he stopped taking Aleve daily. Patient questions whom he should speak to about this pain and if it will interfere with his radiation. Encouraged patient to reach out to the physician that manages his pain. Explained if a procedure is needed the physician will reach out to Dr. Tammi Klippel but if an injection is needed (as has been the case in the past) it won't interfere with his upcoming pre seed appointment. Patient verbalized understanding and expressed appreciation for the return call.

## 2020-02-13 ENCOUNTER — Telehealth: Payer: Self-pay | Admitting: *Deleted

## 2020-02-13 NOTE — Telephone Encounter (Signed)
CALLED PATIENT TO REMIND OF PRE-SEED APPTS. FOR 02-14-20, SPOKE WITH PATIENT AND HE IS AWARE OF THESE APPTS.

## 2020-02-13 NOTE — Telephone Encounter (Signed)
CALLED PATIENT TO INFORM THAT APPTS. HAVE BEEN MOVED TO 02/21/20, SPOKE WITH PATIENT AND HE IS AWARE OF THIS CHANGE AND IS GOOD WITH IT

## 2020-02-13 NOTE — Telephone Encounter (Signed)
CALLED PATIENT TO INFORM THAT WE WILL BE OPENING TOMORROW 02/14/20 @ 10 AM, ARRIVAL TIME FOR PATIENT- 10:15 AM  FOR REGISTRATION, SPOKE WITH PATIENT AND HE IS AWARE OF THE APPT. CHANGES

## 2020-02-14 ENCOUNTER — Ambulatory Visit: Payer: Medicare Other | Admitting: Urology

## 2020-02-14 ENCOUNTER — Encounter (HOSPITAL_COMMUNITY): Admission: RE | Admit: 2020-02-14 | Payer: Medicare Other | Source: Ambulatory Visit

## 2020-02-14 ENCOUNTER — Other Ambulatory Visit: Payer: Self-pay | Admitting: Internal Medicine

## 2020-02-14 ENCOUNTER — Ambulatory Visit: Payer: Medicare Other | Admitting: Radiation Oncology

## 2020-02-14 ENCOUNTER — Inpatient Hospital Stay (HOSPITAL_COMMUNITY): Admission: RE | Admit: 2020-02-14 | Payer: Medicare Other | Source: Ambulatory Visit

## 2020-02-20 ENCOUNTER — Telehealth: Payer: Self-pay | Admitting: *Deleted

## 2020-02-20 NOTE — Telephone Encounter (Signed)
CALLED PATIENT TO REMIND OF PRE-SEED APPTS. AND CHEST X-RAY AND EKG FOR 02-21-20, SPOKE WITH PATIENT AND HE IS AWARE OF THESE APPTS.

## 2020-02-21 ENCOUNTER — Ambulatory Visit (HOSPITAL_COMMUNITY)
Admission: RE | Admit: 2020-02-21 | Discharge: 2020-02-21 | Disposition: A | Discharge status: Home / Self Care | Payer: Medicare Other | Source: Ambulatory Visit | Attending: Urology | Admitting: Urology

## 2020-02-21 ENCOUNTER — Encounter: Payer: Self-pay | Admitting: Medical Oncology

## 2020-02-21 ENCOUNTER — Ambulatory Visit
Admission: RE | Admit: 2020-02-21 | Discharge: 2020-02-21 | Disposition: A | Discharge status: Home / Self Care | Payer: Medicare Other | Source: Ambulatory Visit | Attending: Urology | Admitting: Urology

## 2020-02-21 ENCOUNTER — Ambulatory Visit: Payer: Medicare Other | Admitting: Radiation Oncology

## 2020-02-21 ENCOUNTER — Encounter (HOSPITAL_COMMUNITY)
Admission: RE | Admit: 2020-02-21 | Discharge: 2020-02-21 | Disposition: A | Discharge status: Home / Self Care | Payer: Medicare Other | Source: Ambulatory Visit | Attending: Urology | Admitting: Urology

## 2020-02-21 ENCOUNTER — Other Ambulatory Visit: Payer: Self-pay

## 2020-02-21 ENCOUNTER — Ambulatory Visit
Admission: RE | Admit: 2020-02-21 | Discharge: 2020-02-21 | Disposition: A | Discharge status: Home / Self Care | Payer: Medicare Other | Source: Ambulatory Visit | Attending: Radiation Oncology | Admitting: Radiation Oncology

## 2020-02-21 DIAGNOSIS — C61 Malignant neoplasm of prostate: Secondary | ICD-10-CM | POA: Insufficient documentation

## 2020-02-21 NOTE — Progress Notes (Signed)
  Radiation Oncology         [phone] ________________________________  Name: Antonio Newton MRN: [id number]  Date: 02/21/2020  DOB: 08/30/1946  SIMULATION AND TREATMENT PLANNING NOTE PUBIC ARCH STUDY  [id number] [name], MD  Raynelle Bring, MD  DIAGNOSIS: 74 y.o. gentleman with Stage T1c adenocarcinoma of the prostate with Gleason score of 3+4, and PSA of 9.59     ICD-10-CM   1. Malignant neoplasm of prostate ([place])  C61     COMPLEX SIMULATION:  The patient presented today for evaluation for possible prostate seed implant. He was brought to the radiation planning suite and placed supine on the CT couch. A 3-dimensional image study set was obtained in upload to the planning computer. There, on each axial slice, I contoured the prostate gland. Then, using three-dimensional radiation planning tools I reconstructed the prostate in view of the structures from the transperineal needle pathway to assess for possible pubic arch interference. In doing so, I did not appreciate any pubic arch interference. Also, the patient's prostate volume was estimated based on the drawn structure. The volume was 65 cc.  Given the pubic arch appearance and prostate volume, patient remains a good candidate to proceed with prostate seed implant. Today, he freely provided informed written consent to proceed.    PLAN: The patient will undergo prostate seed implant.   ________________________________  Sheral Apley. Tammi Klippel, M.D.

## 2020-03-11 ENCOUNTER — Encounter (HOSPITAL_BASED_OUTPATIENT_CLINIC_OR_DEPARTMENT_OTHER): Payer: Self-pay | Admitting: Urology

## 2020-03-11 ENCOUNTER — Other Ambulatory Visit: Payer: Self-pay

## 2020-03-11 ENCOUNTER — Telehealth: Payer: Self-pay | Admitting: *Deleted

## 2020-03-11 HISTORY — PX: OTHER SURGICAL HISTORY: SHX169

## 2020-03-11 NOTE — Progress Notes (Addendum)
Addendum Spoke with patient by phone, patient wishes to take potassium todt. Patient instructed he can take potassium today.  Spoke w/ via phone for pre-op interview---[name] needs dos----   none           Lab results------ekg 02-21-2020 epic, chest xray, 02-21-2020 has lab appt on 03-13-2020 at 850 am for cbc, cmet, pt, ptt  COVID test ------[id number] am Arrive at -------1045 am 03-17-2020 NPO after ------midnight Medications to take morning of surgery -----gabapentin, amlodipine, omeprazole Diabetic medication -----n/a Patient Special Instructions ----- Pre-Op special Istructions -----fleets enema am of surgery Patient verbalized understanding of instructions that were given at this phone interview. Patient denies shortness of breath, chest pain, fever, cough a this phone interview.

## 2020-03-11 NOTE — Telephone Encounter (Signed)
Called patient to remind of labs and Covid testing for 03-13-20, spoke with patient and he is aware of these appts.

## 2020-03-13 ENCOUNTER — Other Ambulatory Visit (HOSPITAL_COMMUNITY)
Admission: RE | Admit: 2020-03-13 | Discharge: 2020-03-13 | Disposition: A | Discharge status: Home / Self Care | Payer: Medicare Other | Source: Ambulatory Visit | Attending: Urology | Admitting: Urology

## 2020-03-13 ENCOUNTER — Encounter (HOSPITAL_COMMUNITY)
Admission: RE | Admit: 2020-03-13 | Discharge: 2020-03-13 | Disposition: A | Discharge status: Home / Self Care | Payer: Medicare Other | Source: Ambulatory Visit | Attending: Urology | Admitting: Urology

## 2020-03-13 ENCOUNTER — Other Ambulatory Visit (HOSPITAL_COMMUNITY): Payer: Medicare Other

## 2020-03-13 ENCOUNTER — Other Ambulatory Visit: Payer: Self-pay

## 2020-03-13 DIAGNOSIS — Z20822 Contact with and (suspected) exposure to covid-19: Secondary | ICD-10-CM | POA: Diagnosis not present

## 2020-03-13 DIAGNOSIS — Z01812 Encounter for preprocedural laboratory examination: Secondary | ICD-10-CM | POA: Insufficient documentation

## 2020-03-13 LAB — PROTIME-INR
INR: 1 (ref 0.8–1.2)
Prothrombin Time: 12.6 seconds (ref 11.4–15.2)

## 2020-03-13 LAB — COMPREHENSIVE METABOLIC PANEL
ALT: 25 U/L (ref 0–44)
AST: 26 U/L (ref 15–41)
Albumin: 4.4 g/dL (ref 3.5–5.0)
Alkaline Phosphatase: 57 U/L (ref 38–126)
Anion gap: 11 (ref 5–15)
BUN: 27 mg/dL — ABNORMAL HIGH (ref 8–23)
CO2: 27 mmol/L (ref 22–32)
Calcium: 9.1 mg/dL (ref 8.9–10.3)
Chloride: 102 mmol/L (ref 98–111)
Creatinine, Ser: 1.01 mg/dL (ref 0.61–1.24)
GFR calc Af Amer: >60 mL/min (ref >60)
GFR calc non Af Amer: >60 mL/min (ref >60)
Glucose, Bld: 97 mg/dL (ref 70–99)
Potassium: 3 mmol/L — ABNORMAL LOW (ref 3.5–5.1)
Sodium: 140 mmol/L (ref 135–145)
Total Bilirubin: 1.1 mg/dL (ref 0.3–1.2)
Total Protein: 7.6 g/dL (ref 6.5–8.1)

## 2020-03-13 LAB — CBC
HCT: 40.2 % (ref 39.0–52.0)
Hemoglobin: 13.6 g/dL (ref 13.0–17.0)
MCH: 29.7 pg (ref 26.0–34.0)
MCHC: 33.8 g/dL (ref 30.0–36.0)
MCV: 87.8 fL (ref 80.0–100.0)
Platelets: 193 10*3/uL (ref 150–400)
RBC: 4.58 MIL/uL (ref 4.22–5.81)
RDW: 12.1 % (ref 11.5–15.5)
WBC: 8.8 10*3/uL (ref 4.0–10.5)
nRBC: 0 % (ref 0.0–0.2)

## 2020-03-13 LAB — SARS CORONAVIRUS 2 (TAT 6-24 HRS): SARS Coronavirus 2: NEGATIVE

## 2020-03-13 LAB — APTT: aPTT: 31 seconds (ref 24–36)

## 2020-03-14 ENCOUNTER — Telehealth: Payer: Self-pay | Admitting: *Deleted

## 2020-03-14 NOTE — Progress Notes (Signed)
JESSICA ZANETTO PA MADE AWARE POTASSIUM IS 3.0 ON LABS 03-13-2020.

## 2020-03-14 NOTE — Telephone Encounter (Signed)
CALLED PATIENT TO REMIND OF IMPLANT FOR 03-17-20, SPOKE WITH PATIENT AND HE IS AWARE OF THIS PROCEDURE

## 2020-03-15 NOTE — Anesthesia Preprocedure Evaluation (Addendum)
Anesthesia Evaluation  Patient identified by MRN, date of birth, ID band Patient awake    Reviewed: Allergy & Precautions, NPO status , Patient's Chart, lab work & pertinent test results  History of Anesthesia Complications Negative for: history of anesthetic complications  Airway Mallampati: II  TM Distance: >3 FB Neck ROM: Full    Dental no notable dental hx.    Pulmonary neg pulmonary ROS,    Pulmonary exam normal        Cardiovascular hypertension, Pt. on medications Normal cardiovascular exam     Neuro/Psych Anxiety negative neurological ROS     GI/Hepatic Neg liver ROS, GERD  Medicated and Controlled,  Endo/Other  negative endocrine ROS  Renal/GU negative Renal ROS  negative genitourinary   Musculoskeletal  (+) Arthritis ,   Abdominal   Peds  Hematology negative hematology ROS (+)   Anesthesia Other Findings Day of surgery medications reviewed with patient.  Reproductive/Obstetrics negative OB ROS                            Anesthesia Physical Anesthesia Plan  ASA: II  Anesthesia Plan: General   Post-op Pain Management:    Induction: Intravenous  PONV Risk Score and Plan: 3 and Treatment may vary due to age or medical condition, Ondansetron and Dexamethasone  Airway Management Planned: LMA  Additional Equipment: None  Intra-op Plan:   Post-operative Plan: Extubation in OR  Informed Consent: I have reviewed the patients History and Physical, chart, labs and discussed the procedure including the risks, benefits and alternatives for the proposed anesthesia with the patient or authorized representative who has indicated his/her understanding and acceptance.     Dental advisory given  Plan Discussed with: CRNA  Anesthesia Plan Comments:        Anesthesia Quick Evaluation

## 2020-03-17 ENCOUNTER — Other Ambulatory Visit: Payer: Self-pay

## 2020-03-17 ENCOUNTER — Encounter (HOSPITAL_BASED_OUTPATIENT_CLINIC_OR_DEPARTMENT_OTHER)
Admission: RE | Disposition: A | Discharge status: Home / Self Care | Payer: Self-pay | Source: Home / Self Care | Attending: Urology

## 2020-03-17 ENCOUNTER — Ambulatory Visit (HOSPITAL_BASED_OUTPATIENT_CLINIC_OR_DEPARTMENT_OTHER): Payer: Medicare Other | Admitting: Physician Assistant

## 2020-03-17 ENCOUNTER — Ambulatory Visit (HOSPITAL_BASED_OUTPATIENT_CLINIC_OR_DEPARTMENT_OTHER)
Admission: RE | Admit: 2020-03-17 | Discharge: 2020-03-17 | Disposition: A | Discharge status: Home / Self Care | Payer: Medicare Other | Attending: Urology | Admitting: Urology

## 2020-03-17 ENCOUNTER — Encounter (HOSPITAL_BASED_OUTPATIENT_CLINIC_OR_DEPARTMENT_OTHER): Payer: Self-pay | Admitting: Urology

## 2020-03-17 ENCOUNTER — Ambulatory Visit (HOSPITAL_COMMUNITY): Payer: Medicare Other

## 2020-03-17 DIAGNOSIS — Z79899 Other long term (current) drug therapy: Secondary | ICD-10-CM | POA: Diagnosis not present

## 2020-03-17 DIAGNOSIS — N529 Male erectile dysfunction, unspecified: Secondary | ICD-10-CM | POA: Diagnosis not present

## 2020-03-17 DIAGNOSIS — Z880 Allergy status to penicillin: Secondary | ICD-10-CM | POA: Insufficient documentation

## 2020-03-17 DIAGNOSIS — G9009 Other idiopathic peripheral autonomic neuropathy: Secondary | ICD-10-CM | POA: Insufficient documentation

## 2020-03-17 DIAGNOSIS — M549 Dorsalgia, unspecified: Secondary | ICD-10-CM | POA: Insufficient documentation

## 2020-03-17 DIAGNOSIS — I10 Essential (primary) hypertension: Secondary | ICD-10-CM | POA: Diagnosis not present

## 2020-03-17 DIAGNOSIS — Z7982 Long term (current) use of aspirin: Secondary | ICD-10-CM | POA: Diagnosis not present

## 2020-03-17 DIAGNOSIS — E291 Testicular hypofunction: Secondary | ICD-10-CM | POA: Insufficient documentation

## 2020-03-17 DIAGNOSIS — G8929 Other chronic pain: Secondary | ICD-10-CM | POA: Insufficient documentation

## 2020-03-17 DIAGNOSIS — C61 Malignant neoplasm of prostate: Secondary | ICD-10-CM | POA: Insufficient documentation

## 2020-03-17 HISTORY — PX: SPACE OAR INSTILLATION: SHX6769

## 2020-03-17 HISTORY — PX: RADIOACTIVE SEED IMPLANT: SHX5150

## 2020-03-17 HISTORY — DX: Spinal stenosis, site unspecified: M48.00

## 2020-03-17 HISTORY — PX: CYSTOSCOPY: SHX5120

## 2020-03-17 SURGERY — INSERTION, RADIATION SOURCE, PROSTATE
Anesthesia: General | Site: Prostate

## 2020-03-17 MED ORDER — SODIUM CHLORIDE 0.9 % IV SOLN
INTRAVENOUS | Status: AC | PRN
  Administered 2020-03-17: 1000 mL

## 2020-03-17 MED ORDER — ROCURONIUM BROMIDE 10 MG/ML (PF) SYRINGE
PREFILLED_SYRINGE | INTRAVENOUS | Status: AC
  Filled 2020-03-17: qty 10

## 2020-03-17 MED ORDER — ONDANSETRON HCL 4 MG/2ML IJ SOLN
INTRAMUSCULAR | Status: AC
  Filled 2020-03-17: qty 2

## 2020-03-17 MED ORDER — PROPOFOL 10 MG/ML IV BOLUS
INTRAVENOUS | Status: DC | PRN
  Administered 2020-03-17: 50 mg via INTRAVENOUS
  Administered 2020-03-17: 160 mg via INTRAVENOUS
  Administered 2020-03-17 (×2): 50 mg via INTRAVENOUS
  Administered 2020-03-17: 40 mg via INTRAVENOUS

## 2020-03-17 MED ORDER — STERILE WATER FOR IRRIGATION IR SOLN
Status: DC | PRN
  Administered 2020-03-17: 500 mL

## 2020-03-17 MED ORDER — FENTANYL CITRATE (PF) 100 MCG/2ML IJ SOLN
INTRAMUSCULAR | Status: AC
  Filled 2020-03-17: qty 2

## 2020-03-17 MED ORDER — CIPROFLOXACIN IN D5W 400 MG/200ML IV SOLN
INTRAVENOUS | Status: AC
  Filled 2020-03-17: qty 200

## 2020-03-17 MED ORDER — TRAMADOL HCL 50 MG PO TABS: 50.0000 mg | ORAL_TABLET | Freq: Four times a day (QID) | ORAL | 0 refills | Status: DC | PRN

## 2020-03-17 MED ORDER — LIDOCAINE 2% (20 MG/ML) 5 ML SYRINGE
INTRAMUSCULAR | Status: DC | PRN
  Administered 2020-03-17: 100 mg via INTRAVENOUS

## 2020-03-17 MED ORDER — EPHEDRINE SULFATE 50 MG/ML IJ SOLN
INTRAMUSCULAR | Status: DC | PRN
  Administered 2020-03-17 (×3): 20 mg via INTRAVENOUS

## 2020-03-17 MED ORDER — GLYCOPYRROLATE 0.2 MG/ML IJ SOLN
INTRAMUSCULAR | Status: DC | PRN
  Administered 2020-03-17: .2 mg via INTRAVENOUS

## 2020-03-17 MED ORDER — OXYCODONE HCL 5 MG/5ML PO SOLN
5.0000 mg | Freq: Once | ORAL | Status: DC | PRN
  Filled 2020-03-17: qty 5

## 2020-03-17 MED ORDER — EPHEDRINE 5 MG/ML INJ
INTRAVENOUS | Status: AC
  Filled 2020-03-17: qty 10

## 2020-03-17 MED ORDER — PHENYLEPHRINE 40 MCG/ML (10ML) SYRINGE FOR IV PUSH (FOR BLOOD PRESSURE SUPPORT)
PREFILLED_SYRINGE | INTRAVENOUS | Status: AC
  Filled 2020-03-17: qty 10

## 2020-03-17 MED ORDER — ACETAMINOPHEN 500 MG PO TABS
ORAL_TABLET | ORAL | Status: AC
  Filled 2020-03-17: qty 2

## 2020-03-17 MED ORDER — FENTANYL CITRATE (PF) 100 MCG/2ML IJ SOLN
INTRAMUSCULAR | Status: DC | PRN
  Administered 2020-03-17: 25 ug via INTRAVENOUS
  Administered 2020-03-17: 50 ug via INTRAVENOUS
  Administered 2020-03-17: 25 ug via INTRAVENOUS

## 2020-03-17 MED ORDER — PHENYLEPHRINE HCL (PRESSORS) 10 MG/ML IV SOLN
INTRAVENOUS | Status: DC | PRN
  Administered 2020-03-17: 40 ug via INTRAVENOUS

## 2020-03-17 MED ORDER — CIPROFLOXACIN IN D5W 400 MG/200ML IV SOLN
400.0000 mg | INTRAVENOUS | Status: AC
  Administered 2020-03-17: 400 mg via INTRAVENOUS
  Filled 2020-03-17: qty 200

## 2020-03-17 MED ORDER — KETOROLAC TROMETHAMINE 30 MG/ML IJ SOLN
INTRAMUSCULAR | Status: AC
  Filled 2020-03-17: qty 1

## 2020-03-17 MED ORDER — DOCUSATE SODIUM 100 MG PO CAPS: 100.0000 mg | ORAL_CAPSULE | Freq: Two times a day (BID) | ORAL | 0 refills | Status: DC

## 2020-03-17 MED ORDER — FENTANYL CITRATE (PF) 100 MCG/2ML IJ SOLN
25.0000 ug | INTRAMUSCULAR | Status: DC | PRN
  Filled 2020-03-17: qty 1

## 2020-03-17 MED ORDER — LIDOCAINE 2% (20 MG/ML) 5 ML SYRINGE
INTRAMUSCULAR | Status: AC
  Filled 2020-03-17: qty 5

## 2020-03-17 MED ORDER — GLYCOPYRROLATE PF 0.2 MG/ML IJ SOSY
PREFILLED_SYRINGE | INTRAMUSCULAR | Status: AC
  Filled 2020-03-17: qty 1

## 2020-03-17 MED ORDER — SUGAMMADEX SODIUM 200 MG/2ML IV SOLN
INTRAVENOUS | Status: DC | PRN
  Administered 2020-03-17: 200 mg via INTRAVENOUS

## 2020-03-17 MED ORDER — FLEET ENEMA 7-19 GM/118ML RE ENEM
1.0000 | ENEMA | Freq: Once | RECTAL | Status: DC
  Filled 2020-03-17: qty 1

## 2020-03-17 MED ORDER — PROPOFOL 10 MG/ML IV BOLUS
INTRAVENOUS | Status: AC
  Filled 2020-03-17: qty 40

## 2020-03-17 MED ORDER — ROCURONIUM BROMIDE 100 MG/10ML IV SOLN
INTRAVENOUS | Status: DC | PRN
  Administered 2020-03-17: 60 mg via INTRAVENOUS

## 2020-03-17 MED ORDER — TAMSULOSIN HCL 0.4 MG PO CAPS: 0.4000 mg | ORAL_CAPSULE | Freq: Every day | ORAL | 0 refills | Status: DC

## 2020-03-17 MED ORDER — OXYCODONE HCL 5 MG PO TABS
5.0000 mg | ORAL_TABLET | Freq: Once | ORAL | Status: DC | PRN
  Filled 2020-03-17: qty 1

## 2020-03-17 MED ORDER — DEXAMETHASONE SODIUM PHOSPHATE 4 MG/ML IJ SOLN
INTRAMUSCULAR | Status: DC | PRN
  Administered 2020-03-17: 8 mg via INTRAVENOUS

## 2020-03-17 MED ORDER — ONDANSETRON HCL 4 MG/2ML IJ SOLN
INTRAMUSCULAR | Status: DC | PRN
  Administered 2020-03-17: 4 mg via INTRAVENOUS

## 2020-03-17 MED ORDER — LACTATED RINGERS IV SOLN
INTRAVENOUS | Status: DC
  Administered 2020-03-17: 1000 mL via INTRAVENOUS
  Filled 2020-03-17: qty 1000

## 2020-03-17 MED ORDER — KETOROLAC TROMETHAMINE 30 MG/ML IJ SOLN
INTRAMUSCULAR | Status: DC | PRN
  Administered 2020-03-17: 30 mg via INTRAVENOUS

## 2020-03-17 MED ORDER — PROMETHAZINE HCL 25 MG/ML IJ SOLN
6.2500 mg | INTRAMUSCULAR | Status: DC | PRN
  Filled 2020-03-17: qty 1

## 2020-03-17 MED ORDER — DEXAMETHASONE SODIUM PHOSPHATE 10 MG/ML IJ SOLN
INTRAMUSCULAR | Status: AC
  Filled 2020-03-17: qty 1

## 2020-03-17 MED ORDER — ACETAMINOPHEN 500 MG PO TABS
1000.0000 mg | ORAL_TABLET | Freq: Once | ORAL | Status: AC
  Administered 2020-03-17: 1000 mg via ORAL
  Filled 2020-03-17: qty 2

## 2020-03-17 SURGICAL SUPPLY — 47 items
BAG DRAIN URO-CYSTO SKYTR STRL (DRAIN) ×4 IMPLANT
BAG DRN RND TRDRP ANRFLXCHMBR (UROLOGICAL SUPPLIES) ×2
BAG DRN UROCATH (DRAIN) ×2
BAG URINE DRAIN 2000ML AR STRL (UROLOGICAL SUPPLIES) ×4 IMPLANT
BLADE CLIPPER SENSICLIP SURGIC (BLADE) ×4 IMPLANT
CATH FOLEY 2WAY SLVR  5CC 16FR (CATHETERS) ×4
CATH FOLEY 2WAY SLVR 5CC 16FR (CATHETERS) ×2 IMPLANT
CATH ROBINSON RED A/P 20FR (CATHETERS) ×4 IMPLANT
CLOTH BEACON ORANGE TIMEOUT ST (SAFETY) ×4 IMPLANT
CNTNR URN SCR LID CUP LEK RST (MISCELLANEOUS) ×4 IMPLANT
CONT SPEC 4OZ STRL OR WHT (MISCELLANEOUS) ×8
COVER BACK TABLE 60X90IN (DRAPES) ×4 IMPLANT
COVER MAYO STAND STRL (DRAPES) ×4 IMPLANT
DRSG TEGADERM 4X4.75 (GAUZE/BANDAGES/DRESSINGS) ×8 IMPLANT
DRSG TEGADERM 8X12 (GAUZE/BANDAGES/DRESSINGS) ×6 IMPLANT
GAUZE SPONGE 4X4 8PLY STR LF (GAUZE/BANDAGES/DRESSINGS) ×2 IMPLANT
GLOVE BIO SURGEON STRL SZ 6.5 (GLOVE) ×1 IMPLANT
GLOVE BIO SURGEON STRL SZ7.5 (GLOVE) ×8 IMPLANT
GLOVE BIO SURGEON STRL SZ8 (GLOVE) ×2 IMPLANT
GLOVE BIO SURGEONS STRL SZ 6.5 (GLOVE) ×1
GLOVE BIOGEL PI IND STRL 6.5 (GLOVE) IMPLANT
GLOVE BIOGEL PI IND STRL 7.5 (GLOVE) IMPLANT
GLOVE BIOGEL PI INDICATOR 6.5 (GLOVE) ×2
GLOVE BIOGEL PI INDICATOR 7.5 (GLOVE) ×2
GLOVE SURG ORTHO 8.5 STRL (GLOVE) ×8 IMPLANT
GLOVE SURG SS PI 6.5 STRL IVOR (GLOVE) IMPLANT
GOWN STRL REUS W/TWL LRG LVL3 (GOWN DISPOSABLE) ×8 IMPLANT
HOLDER FOLEY CATH W/STRAP (MISCELLANEOUS) ×2 IMPLANT
IMPL SPACEOAR SYSTEM 10ML (Spacer) ×2 IMPLANT
IMPLANT SPACEOAR SYSTEM 10ML (Spacer) ×4 IMPLANT
IV NS 1000ML (IV SOLUTION) ×4
IV NS 1000ML BAXH (IV SOLUTION) ×2 IMPLANT
KIT TURNOVER CYSTO (KITS) ×4 IMPLANT
MANIFOLD NEPTUNE II (INSTRUMENTS) IMPLANT
MARKER SKIN DUAL TIP RULER LAB (MISCELLANEOUS) ×4 IMPLANT
NS IRRIG 500ML POUR BTL (IV SOLUTION) IMPLANT
PACK CYSTO (CUSTOM PROCEDURE TRAY) ×4 IMPLANT
Radioactive seed ×102 IMPLANT
SURGILUBE 2OZ TUBE FLIPTOP (MISCELLANEOUS) ×4 IMPLANT
SUT BONE WAX W31G (SUTURE) IMPLANT
SYR 10ML LL (SYRINGE) ×2 IMPLANT
TOWEL OR 17X26 10 PK STRL BLUE (TOWEL DISPOSABLE) ×4 IMPLANT
TUBE CONNECTING 12'X1/4 (SUCTIONS)
TUBE CONNECTING 12X1/4 (SUCTIONS) IMPLANT
UNDERPAD 30X30 (UNDERPADS AND DIAPERS) ×8 IMPLANT
WATER STERILE IRR 3000ML UROMA (IV SOLUTION) ×2 IMPLANT
WATER STERILE IRR 500ML POUR (IV SOLUTION) ×4 IMPLANT

## 2020-03-17 NOTE — Op Note (Signed)
Preoperative diagnosis: Clinically localized adenocarcinoma of the prostate (T1c Nx Mx)  Postoperative diagnosis: Clinically localized adenocarcinoma of the prostate  Procedure: 1) Transperineal placement of radioactive seeds into the prostate                    2) Cystoscopy                    3) Insertion of SpaceOAR hydrogel   Surgeon: Pryor Curia. M.D.  Radiation oncologist: Dr. Tyler Pita  Anesthesia: General  EBL: Minimal  Complications: None  Indication: Antonio Newton is a 74 y.o. gentleman with clinically localized prostate cancer. After discussing management options for treatment, he elected to proceed with radiotherapy. He presents today for the above procedures. The potential risks, complications, alternative options, and expected recovery course have been discussed in detail with the patient and he has provided informed consent to proceed.  Description of procedure: The patient was taken to the operating room and general anesthesia was induced. He was administered preoperative antibiotics, placed in the dorsal lithotomy position, and prepped and draped in the usual sterile fashion. Next, intraoperative transrectal ultrasonography was utilized for real-time intraoperative planning by the radiation oncology team. Once the treatment plan was completed and the seed strands created, stranded iodine 125 radiation seeds were placed utilizing a brachytherapy perineal template. 68 radioactive iodine 125 seeds into the prostate through 22 catheter needles.  The brachytherapy template was then removed.  A site in the midline was selected on the perineum for placement of an 18 g needle with saline.  The needle was advanced above the rectum and below Denonvillier's fascia to the mid gland and confirmed to be in the midline on transverse imaging.  One cc of saline was injected confirming appropriate expansion of this space.  A total of 5 cc of saline was then injected to open the  space further bilaterally.  The saline syringe was then removed and the SpaceOAR hydrogel was injected with good distribution bilaterally. Position of the radiation seeds was confirmed on fluoroscopic imaging.  Flexible cystoscopy was then performed and no seeds were identified within the bladder.  No bladder tumors, stones, or other mucosal pathology was identified within the bladder. He tolerated the procedure well and without complications. He was able to be transferred to the recovery unit in satisfactory condition.  He was given a voiding trial in the PACU.

## 2020-03-17 NOTE — Discharge Instructions (Addendum)
You will be prescribed tamsulosin which is a medication to help you urinate over the next month.  You should call Dr. Lynne Logan office [phone]) if you feel you cannot empty your bladder well. Also, call if you develop fever > 101.  You will also be prescribed pain medication and should take an over the counter stool softener over the next week to avoid straining with bowel movements.  You may resume aspirin or other supplements/vitamins 48 hrs after your procedure.  Followup with Dr. Alinda Money and your radiation oncologist as scheduled.    Do not take any Tylenol until after 5:00 pm today. Do not take any nonsteroidal anti inflammatories until after 7:45 pm today.       Post Anesthesia Home Care Instructions  Activity: Get plenty of rest for the remainder of the day. A responsible individual must stay with you for 24 hours following the procedure.  For the next 24 hours, DO NOT: -Drive a car -Paediatric nurse -Drink alcoholic beverages -Take any medication unless instructed by your physician -Make any legal decisions or sign important papers.  Meals: Start with liquid foods such as gelatin or soup. Progress to regular foods as tolerated. Avoid greasy, spicy, heavy foods. If nausea and/or vomiting occur, drink only clear liquids until the nausea and/or vomiting subsides. Call your physician if vomiting continues.  Special Instructions/Symptoms: Your throat may feel dry or sore from the anesthesia or the breathing tube placed in your throat during surgery. If this causes discomfort, gargle with warm salt water. The discomfort should disappear within 24 hours.

## 2020-03-17 NOTE — Interval H&P Note (Signed)
History and Physical Interval Note:  03/17/2020 11:02 AM  Antonio Newton  has presented today for surgery, with the diagnosis of PROSTATE CANCER.  The various methods of treatment have been discussed with the patient and family. After consideration of risks, benefits and other options for treatment, the patient has consented to  Procedure(s): RADIOACTIVE SEED IMPLANT/BRACHYTHERAPY IMPLANT (N/A) SPACE OAR INSTILLATION (N/A) CYSTOSCOPY (N/A) as a surgical intervention.  The patient's history has been reviewed, patient examined, no change in status, stable for surgery.  I have reviewed the patient's chart and labs.  Questions were answered to the patient's satisfaction.     [employer]

## 2020-03-17 NOTE — Progress Notes (Signed)
Father had a bad reaction to pcn and does not want to receive any pcn

## 2020-03-17 NOTE — H&P (Signed)
Office Visit Report     02/26/2020   --------------------------------------------------------------------------------   Antonio Newton  [id number]  DOB: 12/22/1946, 74 year old Male  SSN: [id number]   PRIMARY CARE:  Dellis Filbert A. Sherren Mocha (retired), MD  REFERRING:  Raynelle Bring, Eduardo Osier  PROVIDER:  Rana Snare, M.D.  TREATING:  Mcarthur Rossetti, [place]  LOCATION:  [facility] [phone]     --------------------------------------------------------------------------------   CC/HPI: Pt presents today for pre-operative history and physical exam in anticipation of radioactive seed implant and space oar placement by Dr. Alinda Money on 03/17/20. He is doing well and is without complaint. Pt denies F/C, HA, CP, SOB, N/V, diarrhea/constipation, flank pain, hematuria, and dysuria. He has chronic back pain due to spinal stenosis and will be having injections for this soon.     HX:   CC: Prostate Cancer   PCP:   Antonio Newton is seen today via telehealth due to concerns of risk of transmission of COVID-19 virus. He was initially diagnosed with prostate cancer in June 2015 by Dr. Rana Snare when he was noted to have Gleson 3+3=6 adenocarcinoma in 3 out of 12 biopsy cores. He elected active surveillance management after discussion about options. A surveillance biopsy in July 2106 also confirmed low volume Gleason 3+3=6 disease in 3 cores. A surveillance biopsy in August 2018 was also consistent with low risk disease with 2 cores of Gleason 3+3=6 disease. His most recent PSA increased to 9.59 prompting his most recent biopsy on 11/16/19 that indicated upgraded Gleason 3=4=7 adenocarcinoma with 4 out of 12 biopsy cores positive for malignancy.   Family history: None.   Imaging studies: None.   PMH: He has a history of hypertension and GERD.  PSH: Hernia repair.   TNM stage: cT1c Nx Mx  PSA: 9.59  Gleason score: 3+4=7 (Grade group 2)  Biopsy (11/16/19): 4/12 cores positive   Left: Benign  Right: R apex (80%, 3+4=7), R lateral apex (20%, 3+4=7), R mid (5%, 3+3=6), R lateral base (5%, 3+4=7)  Prostate volume: 65.7 cc  PSAD: 0.15   Nomogram  OC disease: 49%  EPE: 48%  SVI: 5%  LNI: 5%  PFS (5 year, 10 year): 79%, 67%   Urinary function: He denies significant difficulty voiding. He is not on medication.  Erectile function: He can achieve erections adequate for intercourse most of the time with PDE 5 inhibitors.     ALLERGIES: Penicillin - Has never had a reaction to it. Has not taken it because his dad was allergic to it.    MEDICATIONS: Amlodipine Besylate 10 mg tablet Oral  Aspirin 81 MG TABS Oral  Fish Oil 300 mg-1,000 mg capsule Oral  Gabapentin 400 mg capsule Oral  Levocetirizine Dihydrochloride 5 mg tablet  Losartan Potassium 100 mg tablet Oral  Magnesium  Mega Multi Men TBCR Oral  Methocarbamol  Omeprazole 20 mg capsule,delayed release Oral  Potassium Chloride 20 meq packet  Sildenafil Citrate 20 mg tablet 0 Oral     GU PSH: Prostate Needle Biopsy - 11/16/2019, 2018     NON-GU PSH: Hernia Repair - 2017 Neck Surgery - 2017 Surgical Pathology, Gross And Microscopic Examination For Prostate Needle - 11/16/2019, 2018     GU PMH: Acute prostatitis - 08/31/2017 Prostate Cancer - 08/31/2017, - 2018, - 2018, - 2017, Prostate cancer, - 2016 BPH w/o LUTS - 2018 Elevated PSA, PSA elevation - 2017 Unil Inguinal Hernia W/O obst or gang,non-recurrent, Right inguinal hernia - 2017 ED due to  arterial insufficiency, Erectile dysfunction due to arterial insufficiency - 2016 Primary hypogonadism, Hypogonadism, testicular - 2015      PMH Notes:   1) Prostate cancer: He was diagnosed with low risk prostate cancer in June 2015 by Dr. Risa Grill and elected active surveillance management.   Initial diagnosis: June 2015  Clinical stage: cT1c Nx Mx  PSA at diagnosis: 6.01  Gleason score: 3+3=6  Biopsy (05/27/14): 3/12 cores positive - R apex (5%), R mid  (5%), R base (5%)   Surveillance:  Jul 2016: 12 core biopsy (07/07/15): 3/12 cores - R lateral apex (30%), R mid lateral (5%), R lateral base (10%), Vol 38 cc  Aug 2018: 12 core biopsy (08/15/17): 2/12 cores - R lateral apex (20%), R mid (5%), Vol 35 cc   2) Erectile dysfunction:   3) Testosterone deficiency:    spinal stenosis with chronic pain   NON-GU PMH: Personal history of other diseases of the musculoskeletal system and connective tissue, History of degenerative disc disease - 2017 Hypertension Other idiopathic peripheral autonomic neuropathy    FAMILY HISTORY: Family Health Status - Mother's Age - Runs In Family Family Health Status Number - Runs In Family Father Deceased At [employer] ___ - Runs In Family Hypertension - Father Prostate Cancer - Father renal failure - Mother    Notes: maternal uncle with CaP   SOCIAL HISTORY: Marital Status: Married Preferred Language: English; Ethnicity: Not Hispanic Or Latino; Race: Black or African American Current Smoking Status: Patient has never smoked.  Does not use smokeless tobacco. Drinks 1 drink per week. Types of alcohol consumed: Wine. Social Drinker.  Does not use drugs. Drinks 1 caffeinated drink per day. Has not had a blood transfusion. 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     Notes: Never Smoked, Wine on occasion   REVIEW OF SYSTEMS:    GU Review Male:   Patient reports get up at night to urinate. Patient denies frequent urination, hard to postpone urination, burning/ pain with urination, leakage of urine, stream starts and stops, trouble starting your stream, have to strain to urinate , erection problems, and penile pain.  Gastrointestinal (Upper):   Patient denies nausea, vomiting, and indigestion/ heartburn.  Gastrointestinal (Lower):   Patient denies diarrhea and constipation.  Constitutional:   Patient denies fever, night sweats, weight loss, and fatigue.  Skin:   Patient denies skin rash/ lesion and itching.   Eyes:   Patient denies blurred vision and double vision.  Ears/ Nose/ Throat:   Patient denies sore throat and sinus problems.  Hematologic/Lymphatic:   Patient denies swollen glands and easy bruising.  Cardiovascular:   Patient denies leg swelling and chest pains.  Respiratory:   Patient denies cough and shortness of breath.  Endocrine:   Patient denies excessive thirst.  Musculoskeletal:   Patient reports back pain and joint pain.   Neurological:   Patient denies headaches and dizziness.  Psychologic:   Patient denies depression and anxiety.   VITAL SIGNS:      02/26/2020 02:13 PM  Weight 189 lb / 85.73 kg  Height 72 in / 182.88 cm  BP 146/93 mmHg  Pulse 75 /min  Temperature 98.0 F / 36.6 C  BMI 25.6 kg/m   MULTI-SYSTEM PHYSICAL EXAMINATION:    Constitutional: Well-nourished. No physical deformities. Normally developed. Good grooming.  Neck: Neck symmetrical, not swollen. Normal tracheal position.  Respiratory: Normal breath sounds. No labored breathing, no use of accessory muscles.   Cardiovascular: Regular rate and rhythm. No murmur, no gallop.  Lymphatic: No enlargement of neck, axillae, groin.  Skin: No paleness, no jaundice, no cyanosis. No lesion, no ulcer, no rash.  Neurologic / Psychiatric: Oriented to time, oriented to place, oriented to person. No depression, no anxiety, no agitation.  Gastrointestinal: No mass, no tenderness, no rigidity, non obese abdomen.  Eyes: Normal conjunctivae. Normal eyelids.  Ears, Nose, Mouth, and Throat: Left ear no scars, no lesions, no masses. Right ear no scars, no lesions, no masses. Nose no scars, no lesions, no masses. Normal hearing. Normal lips.  Musculoskeletal: Normal gait and station of head and neck.     PAST DATA REVIEWED:  Source Of History:  Patient  Records Review:   Previous Patient Records  Urine Test Review:   Urinalysis   11/18/19  PSA  Total PSA 9.59 ng/mL    02/26/20  Urinalysis  Urine Appearance Clear    Urine Color Yellow   Urine Glucose Neg mg/dL  Urine Bilirubin Neg mg/dL  Urine Ketones Neg mg/dL  Urine Specific Gravity 1.015   Urine Blood Neg ery/uL  Urine pH 7.5   Urine Protein 1+ mg/dL  Urine Urobilinogen 0.2 mg/dL  Urine Nitrites Neg   Urine Leukocyte Esterase Neg leu/uL  Urine WBC/hpf 0 - 5/hpf   Urine RBC/hpf NS (Not Seen)   Urine Epithelial Cells 0 - 5/hpf   Urine Bacteria Rare (0-9/hpf)   Urine Mucous Present   Urine Yeast NS (Not Seen)   Urine Trichomonas Not Present   Urine Cystals NS (Not Seen)   Urine Casts NS (Not Seen)   Urine Sperm Not Present    PROCEDURES:          Urinalysis w/Scope - 81001 Dipstick Dipstick Cont'd Micro  Color: Yellow Bilirubin: Neg mg/dL WBC/hpf: 0 - 5/hpf  Appearance: Clear Ketones: Neg mg/dL RBC/hpf: NS (Not Seen)  Specific Gravity: 1.015 Blood: Neg ery/uL Bacteria: Rare (0-9/hpf)  pH: 7.5 Protein: 1+ mg/dL Cystals: NS (Not Seen)  Glucose: Neg mg/dL Urobilinogen: 0.2 mg/dL Casts: NS (Not Seen)    Nitrites: Neg Trichomonas: Not Present    Leukocyte Esterase: Neg leu/uL Mucous: Present      Epithelial Cells: 0 - 5/hpf      Yeast: NS (Not Seen)      Sperm: Not Present    ASSESSMENT:      ICD-10 Details  1 GU:   Prostate Cancer - C61    PLAN:           Orders Labs Urine Culture          Schedule Return Visit/Planned Activity: Keep Scheduled Appointment - Schedule Surgery          Document Letter(s):  Created for Patient: Clinical Summary         Notes:   There are no changes in the patients history or physical exam since last evaluation by Dr. Alinda Money. Pt is scheduled to undergo seed implant and space oar placement on 03/17/20.   Bacteria noted on UA. Will check culture and call pt with instructions if it is positive.   All pt's questions were answered to the best of my ability.          Next Appointment:      Next Appointment: 03/17/2020 12:30 PM    Appointment Type: Surgery     Location: [facility] [phone]    Provider: Raynelle Bring, M.D.    Reason for Visit: NE/OP RAD SEED, SPACE OAR, CYSTO      * Signed by  Mcarthur Rossetti, PA on 02/26/20 at 2:49 PM (EST)*

## 2020-03-17 NOTE — Anesthesia Postprocedure Evaluation (Signed)
Anesthesia Post Note  Patient: Antonio Newton  Procedure(s) Performed: RADIOACTIVE SEED IMPLANT/BRACHYTHERAPY IMPLANT (N/A Prostate) SPACE OAR INSTILLATION (N/A Prostate) CYSTOSCOPY (N/A Bladder)     Patient location during evaluation: PACU Anesthesia Type: General Level of consciousness: awake and alert Pain management: pain level controlled Vital Signs Assessment: post-procedure vital signs reviewed and stable Respiratory status: spontaneous breathing, nonlabored ventilation, respiratory function stable and patient connected to nasal cannula oxygen Cardiovascular status: blood pressure returned to baseline and stable Postop Assessment: no apparent nausea or vomiting Anesthetic complications: no    Last Vitals:  Vitals:   03/17/20 1430 03/17/20 1437  BP: (!) 144/87 (!) 149/90  Pulse: 89 88  Resp: 12 (!) 22  Temp:    SpO2: 97% 98%    Last Pain:  Vitals:   03/17/20 1123  TempSrc: Oral  PainSc: 0-No pain                 Effie Berkshire

## 2020-03-17 NOTE — Transfer of Care (Signed)
Immediate Anesthesia Transfer of Care Note  Patient: Antonio Newton  Procedure(s) Performed: Procedure(s) (LRB): RADIOACTIVE SEED IMPLANT/BRACHYTHERAPY IMPLANT (N/A) SPACE OAR INSTILLATION (N/A) CYSTOSCOPY (N/A)  Patient Location: PACU  Anesthesia Type: General  Level of Consciousness: awake, sedated, patient cooperative and responds to stimulation  Airway & Oxygen Therapy: Patient Spontanous Breathing and Patient connected to  02 and soft FM   Post-op Assessment: Report given to PACU RN, Post -op Vital signs reviewed and stable and Patient moving all extremities  Post vital signs: Reviewed and stable  Complications: No apparent anesthesia complications

## 2020-03-17 NOTE — Anesthesia Procedure Notes (Addendum)
Procedure Name: Intubation Date/Time: 03/17/2020 12:49 PM Performed by: Brennan Bailey, MD Pre-anesthesia Checklist: Patient identified, Emergency Drugs available, Suction available, Patient being monitored and Timeout performed Patient Re-evaluated:Patient Re-evaluated prior to induction Oxygen Delivery Method: Circle System Utilized Preoxygenation: Pre-oxygenation with 100% oxygen Induction Type: IV induction Ventilation: Mask ventilation without difficulty Laryngoscope Size: Mac and 4 Grade View: Grade III Tube type: Oral Tube size: 8.0 mm Number of attempts: 3 Airway Equipment and Method: Video-laryngoscopy and Rigid stylet Placement Confirmation: ETT inserted through vocal cords under direct vision,  positive ETCO2 and breath sounds checked- equal and bilateral Secured at: 25 cm Tube secured with: Tape Dental Injury: Teeth and Oropharynx as per pre-operative assessment  Difficulty Due To: Difficulty was unanticipated, Difficult Airway- due to anterior larynx and Difficult Airway- due to reduced neck mobility Future Recommendations: Recommend- induction with short-acting agent, and alternative techniques readily available Comments: Initial airway attempt with LMA #5 unable to create adequate seal despite several attempts at repositioning. Mask ventilated between attempts. DL by CRNA with Mac3 blade unable to view glottis. DL by myself with Miller3 blade with grade 3 view, anterior airway noted with limited neck mobility. Glidescope #4 used to secure airway. Grade 2B view with Glidescope despite cricoid pressure. Atraumatic placement of ETT. Recommend Glidescope intubation for future intubations. Daiva Huge, MD

## 2020-03-17 NOTE — Progress Notes (Signed)
  Radiation Oncology         [phone] ________________________________  Name: Antonio Newton MRN: [id number]  Date: 03/17/2020  DOB: [birth year]       Prostate Seed Implant  [id number] Antonio Beals, MD  No ref. provider found  DIAGNOSIS:  Oncology History Overview Note  74 y.o. gentleman with Stage T1c adenocarcinoma of the prostate with Gleason score of 3+4, and PSA of 9.59   Malignant neoplasm of prostate ([place])  01/14/2015 Initial Diagnosis   Malignant neoplasm of prostate ([place])     PROCEDURE: Insertion of radioactive I-125 seeds into the prostate gland.  RADIATION DOSE: 145 Gy, definitive therapy.  TECHNIQUE: Antonio Newton was brought to the operating room with the urologist. He was placed in the dorsolithotomy position. He was catheterized and a rectal tube was inserted. The perineum was shaved, prepped and draped. The ultrasound probe was then introduced into the rectum to see the prostate gland.  TREATMENT DEVICE: A needle grid was attached to the ultrasound probe stand and anchor needles were placed.  3D PLANNING: The prostate was imaged in 3D using a sagittal sweep of the prostate probe. These images were transferred to the planning computer. There, the prostate, urethra and rectum were defined on each axial reconstructed image. Then, the software created an optimized 3D plan and a few seed positions were adjusted. The quality of the plan was reviewed using [facility] information for the target and the following two organs at risk:  Urethra and Rectum.  Then the accepted plan was printed and handed off to the radiation therapist.  Under my supervision, the custom loading of the seeds and spacers was carried out and loaded into sealed vicryl sleeves.  These pre-loaded needles were then placed into the needle holder.Marland Kitchen  PROSTATE VOLUME STUDY:  Using transrectal ultrasound the volume of the prostate was verified to be 42.3 cc.  SPECIAL TREATMENT PROCEDURE/SUPERVISION AND  HANDLING: The pre-loaded needles were then delivered under sagittal guidance. A total of 22 needles were used to deposit 51 seeds in the prostate gland. The individual seed activity was 0.647 mCi.  SpaceOAR:  Yes  COMPLEX SIMULATION: At the end of the procedure, an anterior radiograph of the pelvis was obtained to document seed positioning and count. Cystoscopy was performed to check the urethra and bladder.  MICRODOSIMETRY: At the end of the procedure, the patient was emitting 0.11 mR/hr at 1 meter. Accordingly, he was considered safe for hospital discharge.  PLAN: The patient will return to the radiation oncology clinic for post implant CT dosimetry in three weeks.   ________________________________  Sheral Apley Tammi Klippel, M.D.

## 2020-04-02 ENCOUNTER — Telehealth: Payer: Self-pay | Admitting: *Deleted

## 2020-04-02 NOTE — Telephone Encounter (Signed)
Called patient to remind of post seed appts. and MRI for 04-03-20, spoke with patient and he is aware of these appts.

## 2020-04-03 ENCOUNTER — Encounter: Payer: Self-pay | Admitting: Medical Oncology

## 2020-04-03 ENCOUNTER — Ambulatory Visit (HOSPITAL_COMMUNITY)
Admission: RE | Admit: 2020-04-03 | Discharge: 2020-04-03 | Disposition: A | Discharge status: Home / Self Care | Payer: Medicare Other | Source: Ambulatory Visit | Attending: Urology | Admitting: Urology

## 2020-04-03 ENCOUNTER — Ambulatory Visit
Admission: RE | Admit: 2020-04-03 | Discharge: 2020-04-03 | Disposition: A | Discharge status: Home / Self Care | Payer: Medicare Other | Source: Ambulatory Visit | Attending: Urology | Admitting: Urology

## 2020-04-03 ENCOUNTER — Ambulatory Visit
Admission: RE | Admit: 2020-04-03 | Discharge: 2020-04-03 | Disposition: A | Discharge status: Home / Self Care | Payer: Medicare Other | Source: Ambulatory Visit | Attending: Radiation Oncology | Admitting: Radiation Oncology

## 2020-04-03 ENCOUNTER — Other Ambulatory Visit: Payer: Self-pay

## 2020-04-03 VITALS — BP 151/87 | HR 80 | Temp 98.9°F | Resp 20 | Ht 72.0 in | Wt 189.2 lb

## 2020-04-03 DIAGNOSIS — C61 Malignant neoplasm of prostate: Secondary | ICD-10-CM | POA: Diagnosis present

## 2020-04-03 NOTE — Progress Notes (Signed)
Reports no urinary issues at this time no hematuria dysuria no frequency, leakage, or urgency at this time drinks adequate fluids  nocturia x2

## 2020-04-03 NOTE — Progress Notes (Signed)
Radiation Oncology         [phone] ________________________________  Name: Antonio Newton MRN: [id number]  Date: 04/03/2020  DOB: [birth year]  Post-Seed Follow-Up Visit Note  CC: Isaac Bliss, Rayford Halsted, MD  Raynelle Bring, MD  Diagnosis:   74 y.o. gentleman with Stage T1c adenocarcinoma of the prostate with Gleason score of 3+4, and PSA of 9.59    ICD-10-CM   1. Malignant neoplasm of prostate ([place])  C61     Interval Since Last Radiation:  2.5 weeks 03/17/20:  Insertion of radioactive I-125 seeds into the prostate gland; 145 Gy, definitive therapy with placement of SpaceOAR gel.  Narrative:  The patient returns today for routine follow-up.  He is complaining of increased urinary frequency and urinary hesitation symptoms. He filled out a questionnaire regarding urinary function today providing and overall IPSS score of 11 characterizing his symptoms as mild-moderate with weak stream and occasional sensation of not emptying fully on voiding. He has continued taking Flomax daily as prescribed.  Overall, he is very pleased with his progress and specifically denies gross hematuria, dysuria, straining to void, excessive daytime frequency, urgency or incontinence.  His pre-implant score was 2. He denies any bowel symptoms and reports a healthy appetite.  Energy level remains normal..  ALLERGIES:  has No Known Allergies.  Meds: Current Outpatient Medications  Medication Sig Dispense Refill  . amLODipine (NORVASC) 10 MG tablet TAKE 1 TABLET BY MOUTH  DAILY 90 tablet 1  . docusate sodium (COLACE) 100 MG capsule Take 1 capsule (100 mg total) by mouth 2 (two) times daily. 30 capsule 0  . fluticasone (FLONASE) 50 MCG/ACT nasal spray Place 2 sprays into both nostrils daily. (Patient not taking: Reported on 01/01/2020) 16 g 6  . gabapentin (NEURONTIN) 300 MG capsule TAKE 1 CAPSULE BY MOUTH TWO TIMES DAILY 180 capsule 0  . Glycerin-Hypromellose-PEG 400 (VISINE TIRED EYE RELIEF) 0.2-0.36-1 %  SOLN Place 1-2 drops into both eyes 3 (three) times daily as needed (for dry/tired eyes.).    Marland Kitchen hydrochlorothiazide (HYDRODIURIL) 25 MG tablet TAKE 1 TABLET BY MOUTH  DAILY 90 tablet 1  . levocetirizine (XYZAL) 5 MG tablet Take 1 tablet (5 mg total) by mouth every evening. 90 tablet 1  . losartan (COZAAR) 100 MG tablet TAKE 1 TABLET BY MOUTH  DAILY 90 tablet 1  . Magnesium 500 MG TABS Take by mouth.    . Multiple Vitamins-Minerals (MULTIVITAMIN ADULT PO) Take by mouth.    Marland Kitchen omeprazole (PRILOSEC) 20 MG capsule TAKE 1 CAPSULE BY MOUTH 2  TIMES DAILY BEFORE MEALS 180 capsule 1  . potassium chloride SA (KLOR-CON) 20 MEQ tablet TAKE 1 TABLET BY MOUTH  DAILY 90 tablet 1  . sildenafil (VIAGRA) 100 MG tablet Take 1 tablet (100 mg total) by mouth daily as needed. 10 tablet 11  . tamsulosin (FLOMAX) 0.4 MG CAPS capsule Take 1 capsule (0.4 mg total) by mouth at bedtime. 30 capsule 0  . traMADol (ULTRAM) 50 MG tablet Take 1-2 tablets (50-100 mg total) by mouth every 6 (six) hours as needed (pain). 15 tablet 0   No current facility-administered medications for this visit.    Physical Findings: In general this is a well appearing African American male in no acute distress. He's alert and oriented x4 and appropriate throughout the examination. Cardiopulmonary assessment is negative for acute distress and he exhibits normal effort.   Lab Findings: Lab Results  Component Value Date   WBC 8.8 03/13/2020   HGB 13.6 03/13/2020  HCT 40.2 03/13/2020   MCV 87.8 03/13/2020   PLT 193 03/13/2020    Radiographic Findings:  Patient underwent CT imaging in our clinic for post implant dosimetry. The CT will be reviewed by Dr. Tammi Klippel to confirm there is an adequate distribution of radioactive seeds throughout the prostate gland and ensure that there are no seeds in or near the rectum. His scheduled for prostate MRI following our visit today and those images will be fused with his CT images for further evaluation. We  suspect the final radiation plan and dosimetry will show appropriate coverage of the prostate gland. He understands that we will call and inform him of any unexpected findings on further review of his imaging and dosimetry.  Impression/Plan: 74 y.o. gentleman with Stage T1c adenocarcinoma of the prostate with Gleason score of 3+4, and PSA of 9.59. The patient is recovering from the effects of radiation. His urinary symptoms should gradually improve over the next 4-6 months. We talked about this today. He is encouraged by his improvement already and is otherwise pleased with his outcome. We also talked about long-term follow-up for prostate cancer following seed implant. He understands that ongoing PSA determinations and digital rectal exams will help perform surveillance to rule out disease recurrence. He has a follow up appointment scheduled with Dr. Alinda Money on 05/16/20. He understands what to expect with his PSA measures. Patient was also educated today about some of the long-term effects from radiation including a small risk for rectal bleeding and possibly erectile dysfunction. We talked about some of the general management approaches to these potential complications. However, I did encourage the patient to contact our office or return at any point if he has questions or concerns related to his previous radiation and prostate cancer.    Nicholos Johns, PA-C

## 2020-04-04 ENCOUNTER — Other Ambulatory Visit: Payer: Self-pay | Admitting: Internal Medicine

## 2020-04-07 NOTE — Progress Notes (Signed)
  Radiation Oncology         [phone] ________________________________  Name: Antonio Newton MRN: [id number]  Date: 04/03/2020  DOB: 01/17/1946  COMPLEX SIMULATION NOTE  NARRATIVE:  The patient was brought to the [facility] today following prostate seed implantation approximately one month ago.  Identity was confirmed.  All relevant records and images related to the planned course of therapy were reviewed.  Then, the patient was set-up supine.  CT images were obtained.  The CT images were loaded into the planning software.  Then the prostate and rectum were contoured.  Treatment planning then occurred.  The implanted iodine 125 seeds were identified by the physics staff for projection of radiation distribution  I have requested : 3D Simulation  I have requested a DVH of the following structures: Prostate and rectum.    ________________________________  Sheral Apley Tammi Klippel, M.D.

## 2020-04-24 ENCOUNTER — Encounter: Payer: Self-pay | Admitting: Radiation Oncology

## 2020-04-24 DIAGNOSIS — C61 Malignant neoplasm of prostate: Secondary | ICD-10-CM | POA: Diagnosis not present

## 2020-05-12 ENCOUNTER — Other Ambulatory Visit: Payer: Self-pay | Admitting: Internal Medicine

## 2020-05-27 NOTE — Progress Notes (Signed)
  Radiation Oncology         [phone] ________________________________  Name: Antonio Newton MRN: [id number]  Date: 04/24/2020  DOB: 1946/02/18  3D Planning Note   Prostate Brachytherapy Post-Implant Dosimetry  Diagnosis: 74 y.o. gentleman with Stage T1c adenocarcinoma of the prostate with Gleason score of 3+4, and PSA of 9.59  Narrative: On a previous date, [name] returned following prostate seed implantation for post implant planning. He underwent CT scan complex simulation to delineate the three-dimensional structures of the pelvis and demonstrate the radiation distribution.  Since that time, the seed localization, and complex isodose planning with dose volume histograms have now been completed.  Results:   Prostate Coverage - The dose of radiation delivered to the 90% or more of the prostate gland (D90) was 92.57% of the prescription dose. This exceeds our goal of greater than 90%. Rectal Sparing - The volume of rectal tissue receiving the prescription dose or higher was 0.0 cc. This falls under our thresholds tolerance of 1.0 cc.  Impression: The prostate seed implant appears to show adequate target coverage and appropriate rectal sparing.  Plan:  The patient will continue to follow with urology for ongoing PSA determinations. I would anticipate a high likelihood for local tumor control with minimal risk for rectal morbidity.  ________________________________  Sheral Apley Tammi Klippel, M.D.

## 2020-06-12 ENCOUNTER — Telehealth (INDEPENDENT_AMBULATORY_CARE_PROVIDER_SITE_OTHER): Payer: Medicare Other | Admitting: Family Medicine

## 2020-06-12 ENCOUNTER — Encounter: Payer: Self-pay | Admitting: Family Medicine

## 2020-06-12 VITALS — Wt 189.0 lb

## 2020-06-12 DIAGNOSIS — R059 Cough, unspecified: Secondary | ICD-10-CM

## 2020-06-12 DIAGNOSIS — R0982 Postnasal drip: Secondary | ICD-10-CM

## 2020-06-12 DIAGNOSIS — R05 Cough: Secondary | ICD-10-CM

## 2020-06-12 DIAGNOSIS — R0981 Nasal congestion: Secondary | ICD-10-CM

## 2020-06-12 MED ORDER — BENZONATATE 100 MG PO CAPS: 100.0000 mg | ORAL_CAPSULE | Freq: Two times a day (BID) | ORAL | 0 refills | Status: DC | PRN

## 2020-06-12 NOTE — Progress Notes (Signed)
Virtual Visit via Video Note  I connected with Antonio Newton  on 06/12/20 at 10:00 AM EDT by a video enabled telemedicine application and verified that I am speaking with the correct person using two identifiers.  Location patient: home, [place] Location provider:work or home office Persons participating in the virtual visit: patient, provider  I discussed the limitations of evaluation and management by telemedicine and the availability of in person appointments. The patient expressed understanding and agreed to proceed.   HPI:  Acute visit for cough: -started about 5-6 days ago -symptoms include started as a sore throat, tickle in the throat, PND, clear nasal congestion, sometimes worse at night, sneezing, itchy eyes - but pretty common for him with allergies -he has tried cough drops and some OTC cold/cough medications -Denies fevers, malaise, hemoptysis, NVD, SOB -denies sick contacts -he did attend a funeral last weekend at the church - did wear masks and social distance -fully vaccinated for COVID19  ROS: See pertinent positives and negatives per HPI.  Past Medical History:  Diagnosis Date  . Allergy   . Anxiety   . ED (erectile dysfunction)   . Hypertension   . Incarcerated right inguinal hernia 12/23/2016  . Neuromuscular disorder ([facility])    neuropathy - mildly in bil hands-is improved since neck surgery  . OA (osteoarthritis)    left hip  . Pneumonia YRS AGO  . Prostate cancer ([place])   . Spinal stenosis     Past Surgical History:  Procedure Laterality Date  . ANTERIOR CERVICAL DECOMP/DISCECTOMY FUSION N/A 02/06/2016   Procedure: Cevical three-four, Cervical four-five, Cervical five-six anterior cervical decompression with fusion interbody prosthesis plating and bonegraft;  Surgeon: Consuella Lose, MD;  Location: [facility];  Service: Neurosurgery;  Laterality: N/A;  . COLONOSCOPY    . CYSTOSCOPY N/A 03/17/2020   Procedure: CYSTOSCOPY;  Surgeon: Raynelle Bring, MD;  Location:  [facility];  Service: Urology;  Laterality: N/A;  . INGUINAL HERNIA REPAIR Right 12/23/2016   Procedure: OPEN REPAIR RIGHT INGUINAL HERNIA WITH MESH;  Surgeon: Fanny Skates, MD;  Location: [facility];  Service: General;  Laterality: Right;  . INSERTION OF MESH Right 12/23/2016   Procedure: INSERTION OF MESH;  Surgeon: Fanny Skates, MD;  Location: [facility];  Service: General;  Laterality: Right;  . None    . postate biopsy    . RADIOACTIVE SEED IMPLANT N/A 03/17/2020   Procedure: RADIOACTIVE SEED IMPLANT/BRACHYTHERAPY IMPLANT;  Surgeon: Raynelle Bring, MD;  Location[facility];  Service: Urology;  Laterality: N/A;  . SPACE OAR INSTILLATION N/A 03/17/2020   Procedure: SPACE OAR INSTILLATION;  Surgeon: Raynelle Bring, MD;  Location: [facility];  Service: Urology;  Laterality: N/A;  . steroid injection to back  03/11/2020    Family History  Problem Relation Age of Onset  . Hypertension Father        Deceased, 61  . Prostate cancer Father   . Hypertension Mother        Deceased, 72  . Renal cancer Mother        mets to lung  . Colon cancer Mother   . Healthy Sister   . Esophageal cancer Paternal Grandfather   . Rectal cancer Neg Hx   . Stomach cancer Neg Hx   . Breast cancer Neg Hx   . Pancreatic cancer Neg Hx     SOCIAL HX: see hpi   Current Outpatient Medications:  .  amLODipine (NORVASC) 10 MG tablet, TAKE 1 TABLET BY  MOUTH  DAILY, Disp: 90 tablet, Rfl: 1 .  docusate sodium (COLACE) 100 MG capsule, Take 1 capsule (100 mg total) by mouth 2 (two) times daily., Disp: 30 capsule, Rfl: 0 .  gabapentin (NEURONTIN) 300 MG capsule, TAKE 1 CAPSULE BY MOUTH TWO TIMES DAILY (Patient taking differently: TAKE 1 CAPSULE BY MOUTH one time DAILY), Disp: 180 capsule, Rfl: 0 .  Glycerin-Hypromellose-PEG 400 (VISINE TIRED EYE RELIEF) 0.2-0.36-1 % SOLN, Place 1-2 drops into both eyes 3 (three) times daily as needed (for dry/tired eyes.)., Disp: , Rfl:  .   hydrochlorothiazide (HYDRODIURIL) 25 MG tablet, TAKE 1 TABLET BY MOUTH  DAILY, Disp: 90 tablet, Rfl: 1 .  levocetirizine (XYZAL) 5 MG tablet, TAKE 1 TABLET BY MOUTH IN  THE EVENING, Disp: 90 tablet, Rfl: 1 .  losartan (COZAAR) 100 MG tablet, TAKE 1 TABLET BY MOUTH  DAILY, Disp: 90 tablet, Rfl: 1 .  Magnesium 500 MG TABS, Take by mouth., Disp: , Rfl:  .  Multiple Vitamins-Minerals (MULTIVITAMIN ADULT PO), Take by mouth., Disp: , Rfl:  .  omeprazole (PRILOSEC) 20 MG capsule, TAKE 1 CAPSULE BY MOUTH 2  TIMES DAILY BEFORE MEALS, Disp: 180 capsule, Rfl: 1 .  potassium chloride SA (KLOR-CON) 20 MEQ tablet, TAKE 1 TABLET BY MOUTH  DAILY, Disp: 90 tablet, Rfl: 1 .  sildenafil (VIAGRA) 100 MG tablet, Take 1 tablet (100 mg total) by mouth daily as needed., Disp: 10 tablet, Rfl: 11 .  tamsulosin (FLOMAX) 0.4 MG CAPS capsule, Take 1 capsule (0.4 mg total) by mouth at bedtime., Disp: 30 capsule, Rfl: 0 .  benzonatate (TESSALON PERLES) 100 MG capsule, Take 1 capsule (100 mg total) by mouth 2 (two) times daily as needed., Disp: 20 capsule, Rfl: 0  EXAM:  VITALS per patient if applicable:  GENERAL: alert, oriented, appears well and in no acute distress  HEENT: atraumatic, conjunttiva clear, no obvious abnormalities on inspection of external nose and ears  NECK: normal movements of the head and neck  LUNGS: on inspection no signs of respiratory distress, breathing rate appears normal, no obvious gross SOB, gasping or wheezing  CV: no obvious cyanosis  MS: moves all visible extremities without noticeable abnormality  PSYCH/NEURO: pleasant and cooperative, no obvious depression or anxiety, speech and thought processing grossly intact  ASSESSMENT AND PLAN:  Discussed the following assessment and plan:  Cough  Nasal congestion  PND (post-nasal drip)  -we discussed possible serious and likely etiologies, options for evaluation and workup, limitations of telemedicine visit vs in person visit,  treatment, treatment risks and precautions. Pt prefers to treat via telemedicine empirically rather then risking or undertaking an in person visit at this moment. Query VURI vs allergic PND - opted for symptomatic care with nasal saline, continuation of allergy medication, tessalon. Patient agrees to seek prompt in person care if worsening, new symptoms arise, or if is not improving with treatment.   I discussed the assessment and treatment plan with the patient. The patient was provided an opportunity to ask questions and all were answered. The patient agreed with the plan and demonstrated an understanding of the instructions.   The patient was advised to call back or seek an in-person evaluation if the symptoms worsen or if the condition fails to improve as anticipated.   Lucretia Kern, DO

## 2020-07-07 ENCOUNTER — Other Ambulatory Visit: Payer: Self-pay | Admitting: Internal Medicine

## 2020-07-07 DIAGNOSIS — I10 Essential (primary) hypertension: Secondary | ICD-10-CM

## 2020-07-20 ENCOUNTER — Other Ambulatory Visit: Payer: Self-pay | Admitting: Family Medicine

## 2020-07-25 DIAGNOSIS — G5603 Carpal tunnel syndrome, bilateral upper limbs: Secondary | ICD-10-CM | POA: Diagnosis not present

## 2020-07-25 DIAGNOSIS — G5623 Lesion of ulnar nerve, bilateral upper limbs: Secondary | ICD-10-CM | POA: Diagnosis not present

## 2020-08-27 ENCOUNTER — Other Ambulatory Visit: Payer: Self-pay | Admitting: Internal Medicine

## 2020-08-27 DIAGNOSIS — I10 Essential (primary) hypertension: Secondary | ICD-10-CM

## 2020-09-12 ENCOUNTER — Ambulatory Visit (INDEPENDENT_AMBULATORY_CARE_PROVIDER_SITE_OTHER): Payer: Medicare Other | Admitting: *Deleted

## 2020-09-12 ENCOUNTER — Other Ambulatory Visit: Payer: Self-pay

## 2020-09-12 DIAGNOSIS — Z23 Encounter for immunization: Secondary | ICD-10-CM | POA: Diagnosis not present

## 2020-11-14 DIAGNOSIS — R3912 Poor urinary stream: Secondary | ICD-10-CM | POA: Diagnosis not present

## 2020-11-27 DIAGNOSIS — M48062 Spinal stenosis, lumbar region with neurogenic claudication: Secondary | ICD-10-CM | POA: Diagnosis not present

## 2020-11-27 DIAGNOSIS — G5603 Carpal tunnel syndrome, bilateral upper limbs: Secondary | ICD-10-CM | POA: Diagnosis not present

## 2020-11-27 DIAGNOSIS — I10 Essential (primary) hypertension: Secondary | ICD-10-CM | POA: Diagnosis not present

## 2020-11-27 DIAGNOSIS — G5623 Lesion of ulnar nerve, bilateral upper limbs: Secondary | ICD-10-CM | POA: Diagnosis not present

## 2020-12-10 ENCOUNTER — Encounter: Payer: Self-pay | Admitting: Internal Medicine

## 2020-12-17 ENCOUNTER — Encounter: Payer: Medicare Other | Admitting: Internal Medicine

## 2020-12-24 ENCOUNTER — Encounter: Payer: Self-pay | Admitting: Family Medicine

## 2020-12-24 ENCOUNTER — Telehealth (INDEPENDENT_AMBULATORY_CARE_PROVIDER_SITE_OTHER): Payer: Medicare Other | Admitting: Family Medicine

## 2020-12-24 DIAGNOSIS — R1013 Epigastric pain: Secondary | ICD-10-CM | POA: Diagnosis not present

## 2020-12-24 DIAGNOSIS — K219 Gastro-esophageal reflux disease without esophagitis: Secondary | ICD-10-CM

## 2020-12-24 NOTE — Progress Notes (Signed)
Patient ID: Antonio Newton, male   DOB: [birth year], 74 y.o.   MRN: [id number]  This visit type was conducted due to national recommendations for restrictions regarding the COVID-19 pandemic in an effort to limit this patient's exposure and mitigate transmission in our community.   Virtual Visit via Video Note  I connected with Antonio Newton on 12/24/20 at 10:30 AM EST by a video enabled telemedicine application and verified that I am speaking with the correct person using two identifiers.  Location patient: home Location provider:work or home office Persons participating in the virtual visit: patient, provider  I discussed the limitations of evaluation and management by telemedicine and the availability of in person appointments. The patient expressed understanding and agreed to proceed.   HPI: Antonio Newton called with some recent "stomach issues ".  He states he has had longstanding history of some GERD and dyspepsia and at baseline takes Prilosec 20 mg daily.  He has been on this for about 2 years.  Around last Wednesday he noted some achy pain epigastric area and what he describes as a "sour stomach ".  No diarrhea.  No vomiting but had some low-grade queasiness.  He had eaten fairly heavy recently over the holidays.  About 4 days ago started more bland diet and symptoms are improved today.  No nausea today and appetite improved.  Denies any recent chest pains or dysphagia.  No odynophagia.  No melena.  No exertional symptoms.  No dyspnea.  Weight and appetite are stable.   ROS: See pertinent positives and negatives per HPI.  Past Medical History:  Diagnosis Date  . Allergy   . Anxiety   . ED (erectile dysfunction)   . Hypertension   . Incarcerated right inguinal hernia 12/23/2016  . Neuromuscular disorder (HCC)    neuropathy - mildly in bil hands-is improved since neck surgery  . OA (osteoarthritis)    left hip  . Pneumonia YRS AGO  . Prostate cancer (HCC)   . Spinal  stenosis     Past Surgical History:  Procedure Laterality Date  . ANTERIOR CERVICAL DECOMP/DISCECTOMY FUSION N/A 02/06/2016   Procedure: Cevical three-four, Cervical four-five, Cervical five-six anterior cervical decompression with fusion interbody prosthesis plating and bonegraft;  Surgeon: Lisbeth Renshaw, MD;  Location: [facility];  Service: Neurosurgery;  Laterality: N/A;  . COLONOSCOPY    . CYSTOSCOPY N/A 03/17/2020   Procedure: CYSTOSCOPY;  Surgeon: Heloise Purpura, MD;  Location: [facility];  Service: Urology;  Laterality: N/A;  . INGUINAL HERNIA REPAIR Right 12/23/2016   Procedure: OPEN REPAIR RIGHT INGUINAL HERNIA WITH MESH;  Surgeon: Claud Kelp, MD;  Location: [facility];  Service: General;  Laterality: Right;  . INSERTION OF MESH Right 12/23/2016   Procedure: INSERTION OF MESH;  Surgeon: Claud Kelp, MD;  Location: [facility];  Service: General;  Laterality: Right;  . None    . postate biopsy    . RADIOACTIVE SEED IMPLANT N/A 03/17/2020   Procedure: RADIOACTIVE SEED IMPLANT/BRACHYTHERAPY IMPLANT;  Surgeon: Heloise Purpura, MD;  Location: [facility];  Service: Urology;  Laterality: N/A;  . SPACE OAR INSTILLATION N/A 03/17/2020   Procedure: SPACE OAR INSTILLATION;  Surgeon: Heloise Purpura, MD;  Location: [facility];  Service: Urology;  Laterality: N/A;  . steroid injection to back  03/11/2020    Family History  Problem Relation Age of Onset  . Hypertension Father        Deceased, 66  . Prostate cancer Father   .  Hypertension Mother        Deceased, 77  . Renal cancer Mother        mets to lung  . Colon cancer Mother   . Healthy Sister   . Esophageal cancer Paternal Grandfather   . Rectal cancer Neg Hx   . Stomach cancer Neg Hx   . Breast cancer Neg Hx   . Pancreatic cancer Neg Hx     SOCIAL HX: Non-smoker   Current Outpatient Medications:  .  amLODipine (NORVASC) 10 MG tablet, TAKE 1 TABLET BY MOUTH  DAILY, Disp: 90  tablet, Rfl: 1 .  benzonatate (TESSALON PERLES) 100 MG capsule, Take 1 capsule (100 mg total) by mouth 2 (two) times daily as needed., Disp: 20 capsule, Rfl: 0 .  docusate sodium (COLACE) 100 MG capsule, Take 1 capsule (100 mg total) by mouth 2 (two) times daily., Disp: 30 capsule, Rfl: 0 .  gabapentin (NEURONTIN) 300 MG capsule, TAKE 1 CAPSULE BY MOUTH  TWICE DAILY, Disp: 180 capsule, Rfl: 3 .  Glycerin-Hypromellose-PEG 400 (VISINE TIRED EYE RELIEF) 0.2-0.36-1 % SOLN, Place 1-2 drops into both eyes 3 (three) times daily as needed (for dry/tired eyes.)., Disp: , Rfl:  .  hydrochlorothiazide (HYDRODIURIL) 25 MG tablet, TAKE 1 TABLET BY MOUTH  DAILY, Disp: 90 tablet, Rfl: 1 .  levocetirizine (XYZAL) 5 MG tablet, TAKE 1 TABLET BY MOUTH IN  THE EVENING, Disp: 90 tablet, Rfl: 1 .  losartan (COZAAR) 100 MG tablet, TAKE 1 TABLET BY MOUTH  DAILY, Disp: 90 tablet, Rfl: 1 .  Magnesium 500 MG TABS, Take by mouth., Disp: , Rfl:  .  Multiple Vitamins-Minerals (MULTIVITAMIN ADULT PO), Take by mouth., Disp: , Rfl:  .  omeprazole (PRILOSEC) 20 MG capsule, TAKE 1 CAPSULE BY MOUTH 2  TIMES DAILY BEFORE MEALS, Disp: 180 capsule, Rfl: 1 .  potassium chloride SA (KLOR-CON) 20 MEQ tablet, TAKE 1 TABLET BY MOUTH  DAILY, Disp: 90 tablet, Rfl: 1 .  sildenafil (VIAGRA) 100 MG tablet, Take 1 tablet (100 mg total) by mouth daily as needed., Disp: 10 tablet, Rfl: 11 .  tamsulosin (FLOMAX) 0.4 MG CAPS capsule, Take 1 capsule (0.4 mg total) by mouth at bedtime., Disp: 30 capsule, Rfl: 0  EXAM:  VITALS per patient if applicable:  GENERAL: alert, oriented, appears well and in no acute distress  HEENT: atraumatic, conjunttiva clear, no obvious abnormalities on inspection of external nose and ears  NECK: normal movements of the head and neck  LUNGS: on inspection no signs of respiratory distress, breathing rate appears normal, no obvious gross SOB, gasping or wheezing  CV: no obvious cyanosis  MS: moves all visible  extremities without noticeable abnormality  PSYCH/NEURO: pleasant and cooperative, no obvious depression or anxiety, speech and thought processing grossly intact  ASSESSMENT AND PLAN:  Discussed the following assessment and plan:  Patient relates onset last week of some dyspepsia and mild epigastric discomfort.  Suspect related to GERD.  He denies any red flags such as weight loss, fever, chest pains, dysphagia, melena, vomiting  -We recommend he continue to avoid high fat foods and high glycemic foods as well as specific precipitating foods such as citric acid, mint, chocolate, caffeine -Recommend trial of supplement with over-the-counter Pepcid 20 mg once or twice daily in addition to his 20 mg Prilosec. -He has follow-up with primary next week for physical and discuss further at that time if not improving.     I discussed the assessment and treatment plan with the patient.  The patient was provided an opportunity to ask questions and all were answered. The patient agreed with the plan and demonstrated an understanding of the instructions.   The patient was advised to call back or seek an in-person evaluation if the symptoms worsen or if the condition fails to improve as anticipated.     Carolann Littler, MD

## 2020-12-31 ENCOUNTER — Encounter: Payer: Self-pay | Admitting: Internal Medicine

## 2020-12-31 ENCOUNTER — Other Ambulatory Visit: Payer: Self-pay

## 2020-12-31 ENCOUNTER — Ambulatory Visit (INDEPENDENT_AMBULATORY_CARE_PROVIDER_SITE_OTHER): Payer: Medicare Other | Admitting: Internal Medicine

## 2020-12-31 VITALS — BP 135/80 | HR 71 | Temp 98.3°F | Ht 70.25 in | Wt 192.1 lb

## 2020-12-31 DIAGNOSIS — E876 Hypokalemia: Secondary | ICD-10-CM | POA: Diagnosis not present

## 2020-12-31 DIAGNOSIS — K219 Gastro-esophageal reflux disease without esophagitis: Secondary | ICD-10-CM

## 2020-12-31 DIAGNOSIS — C61 Malignant neoplasm of prostate: Secondary | ICD-10-CM

## 2020-12-31 DIAGNOSIS — I10 Essential (primary) hypertension: Secondary | ICD-10-CM

## 2020-12-31 DIAGNOSIS — Z0000 Encounter for general adult medical examination without abnormal findings: Secondary | ICD-10-CM | POA: Diagnosis not present

## 2020-12-31 DIAGNOSIS — E785 Hyperlipidemia, unspecified: Secondary | ICD-10-CM

## 2020-12-31 LAB — LIPID PANEL
Cholesterol: 164 mg/dL (ref 0–200)
HDL: 41.6 mg/dL (ref >39.00)
LDL Cholesterol: 85 mg/dL (ref 0–99)
NonHDL: 122.35
Total CHOL/HDL Ratio: 4
Triglycerides: 185 mg/dL — ABNORMAL HIGH (ref 0.0–149.0)
VLDL: 37 mg/dL (ref 0.0–40.0)

## 2020-12-31 LAB — COMPREHENSIVE METABOLIC PANEL
ALT: 24 U/L (ref 0–53)
AST: 19 U/L (ref 0–37)
Albumin: 4.8 g/dL (ref 3.5–5.2)
Alkaline Phosphatase: 66 U/L (ref 39–117)
BUN: 22 mg/dL (ref 6–23)
CO2: 33 mEq/L — ABNORMAL HIGH (ref 19–32)
Calcium: 9.6 mg/dL (ref 8.4–10.5)
Chloride: 98 mEq/L (ref 96–112)
Creatinine, Ser: 1.35 mg/dL (ref 0.40–1.50)
GFR: 51.89 mL/min — ABNORMAL LOW (ref >60.00)
Glucose, Bld: 108 mg/dL — ABNORMAL HIGH (ref 70–99)
Potassium: 3.8 mEq/L (ref 3.5–5.1)
Sodium: 140 mEq/L (ref 135–145)
Total Bilirubin: 0.8 mg/dL (ref 0.2–1.2)
Total Protein: 7.5 g/dL (ref 6.0–8.3)

## 2020-12-31 LAB — CBC WITH DIFFERENTIAL/PLATELET
Basophils Absolute: 0 10*3/uL (ref 0.0–0.1)
Basophils Relative: 0.3 % (ref 0.0–3.0)
Eosinophils Absolute: 0.2 10*3/uL (ref 0.0–0.7)
Eosinophils Relative: 3.5 % (ref 0.0–5.0)
HCT: 41.8 % (ref 39.0–52.0)
Hemoglobin: 14.3 g/dL (ref 13.0–17.0)
Lymphocytes Relative: 17.1 % (ref 12.0–46.0)
Lymphs Abs: 0.9 10*3/uL (ref 0.7–4.0)
MCHC: 34.2 g/dL (ref 30.0–36.0)
MCV: 87.3 fl (ref 78.0–100.0)
Monocytes Absolute: 0.4 10*3/uL (ref 0.1–1.0)
Monocytes Relative: 7.9 % (ref 3.0–12.0)
Neutro Abs: 3.6 10*3/uL (ref 1.4–7.7)
Neutrophils Relative %: 71.2 % (ref 43.0–77.0)
Platelets: 192 10*3/uL (ref 150.0–400.0)
RBC: 4.79 Mil/uL (ref 4.22–5.81)
RDW: 13 % (ref 11.5–15.5)
WBC: 5 10*3/uL (ref 4.0–10.5)

## 2020-12-31 LAB — TSH: TSH: 1.43 u[IU]/mL (ref 0.35–4.50)

## 2020-12-31 LAB — VITAMIN B12: Vitamin B-12: 351 pg/mL (ref 211–911)

## 2020-12-31 LAB — VITAMIN D 25 HYDROXY (VIT D DEFICIENCY, FRACTURES): VITD: 58.86 ng/mL (ref 30.00–100.00)

## 2020-12-31 LAB — MAGNESIUM: Magnesium: 2 mg/dL (ref 1.5–2.5)

## 2020-12-31 LAB — HEMOGLOBIN A1C: Hgb A1c MFr Bld: 4.8 % (ref 4.6–6.5)

## 2020-12-31 MED ORDER — PANTOPRAZOLE SODIUM 40 MG PO TBEC: 40.0000 mg | DELAYED_RELEASE_TABLET | Freq: Two times a day (BID) | ORAL | 1 refills | Status: DC

## 2020-12-31 NOTE — Progress Notes (Signed)
Established Patient Office Visit     This visit occurred during the SARS-CoV-2 public health emergency.  Safety protocols were in place, including screening questions prior to the visit, additional usage of staff PPE, and extensive cleaning of exam room while observing appropriate contact time as indicated for disinfecting solutions.    CC/Reason for Visit: Annual preventive exam and subsequent Medicare wellness visit  HPI: Antonio Newton is a 75 y.o. male who is coming in today for the above mentioned reasons. Past Medical History is significant for: Hypertension, GERD that has not been well controlled on PPI therapy, status post cervical fusion with lumbar spinal stenosis with neuropathy on gabapentin.  He tells me he is scheduled for an epidural steroid injection later this week.  He has been taking Aleve almost on a daily basis due to ongoing back pain.  His acid reflux has been flaring up again.  He has been having epigastric pain with a burning sensation of his chest almost on a daily basis.  In December he had a visit with another provider in the office who suggested adding Pepcid to his twice daily OTC Prilosec.  This has helped some.  He had an upper endoscopy in 2016.  He has routine eye and dental care.  He had a colonoscopy in 2016.  He has a history of hypokalemia and hypomagnesemia and wants these levels checked today.  He has completed his Covid vaccination series.  He is scheduled for his second shingles vaccine in February.   Past Medical/Surgical History: Past Medical History:  Diagnosis Date  . Allergy   . Anxiety   . ED (erectile dysfunction)   . Hypertension   . Incarcerated right inguinal hernia 12/23/2016  . Neuromuscular disorder (HCC)    neuropathy - mildly in bil hands-is improved since neck surgery  . OA (osteoarthritis)    left hip  . Pneumonia YRS AGO  . Prostate cancer ([place])   . Spinal stenosis     Past Surgical History:  Procedure Laterality  Date  . ANTERIOR CERVICAL DECOMP/DISCECTOMY FUSION N/A 02/06/2016   Procedure: Cevical three-four, Cervical four-five, Cervical five-six anterior cervical decompression with fusion interbody prosthesis plating and bonegraft;  Surgeon: Consuella Lose, MD;  Location: [facility];  Service: Neurosurgery;  Laterality: N/A;  . COLONOSCOPY    . CYSTOSCOPY N/A 03/17/2020   Procedure: CYSTOSCOPY;  Surgeon: Raynelle Bring, MD;  Location: [facility];  Service: Urology;  Laterality: N/A;  . INGUINAL HERNIA REPAIR Right 12/23/2016   Procedure: OPEN REPAIR RIGHT INGUINAL HERNIA WITH MESH;  Surgeon: Fanny Skates, MD;  Location: [facility];  Service: General;  Laterality: Right;  . INSERTION OF MESH Right 12/23/2016   Procedure: INSERTION OF MESH;  Surgeon: Fanny Skates, MD;  Location: [facility];  Service: General;  Laterality: Right;  . None    . postate biopsy    . RADIOACTIVE SEED IMPLANT N/A 03/17/2020   Procedure: RADIOACTIVE SEED IMPLANT/BRACHYTHERAPY IMPLANT;  Surgeon: Raynelle Bring, MD;  Location: [facility];  Service: Urology;  Laterality: N/A;  . SPACE OAR INSTILLATION N/A 03/17/2020   Procedure: SPACE OAR INSTILLATION;  Surgeon: Raynelle Bring, MD;  Location: [facility];  Service: Urology;  Laterality: N/A;  . steroid injection to back  03/11/2020    Social History:  reports that he has never smoked. He has never used smokeless tobacco. He reports current alcohol use. He reports that he does not use drugs.  Allergies: No Known  Allergies  Family History:  Family History  Problem Relation Age of Onset  . Hypertension Father        Deceased, 4  . Prostate cancer Father   . Hypertension Mother        Deceased, 53  . Renal cancer Mother        mets to lung  . Colon cancer Mother   . Healthy Sister   . Esophageal cancer Paternal Grandfather   . Rectal cancer Neg Hx   . Stomach cancer Neg Hx   . Breast cancer Neg Hx   . Pancreatic cancer  Neg Hx      Current Outpatient Medications:  .  amLODipine (NORVASC) 10 MG tablet, TAKE 1 TABLET BY MOUTH  DAILY, Disp: 90 tablet, Rfl: 1 .  docusate sodium (COLACE) 100 MG capsule, Take 1 capsule (100 mg total) by mouth 2 (two) times daily., Disp: 30 capsule, Rfl: 0 .  gabapentin (NEURONTIN) 300 MG capsule, TAKE 1 CAPSULE BY MOUTH  TWICE DAILY, Disp: 180 capsule, Rfl: 3 .  Glycerin-Hypromellose-PEG 400 0.2-0.36-1 % SOLN, Place 1-2 drops into both eyes 3 (three) times daily as needed (for dry/tired eyes.)., Disp: , Rfl:  .  hydrochlorothiazide (HYDRODIURIL) 25 MG tablet, TAKE 1 TABLET BY MOUTH  DAILY, Disp: 90 tablet, Rfl: 1 .  levocetirizine (XYZAL) 5 MG tablet, TAKE 1 TABLET BY MOUTH IN  THE EVENING, Disp: 90 tablet, Rfl: 1 .  losartan (COZAAR) 100 MG tablet, TAKE 1 TABLET BY MOUTH  DAILY, Disp: 90 tablet, Rfl: 1 .  Magnesium 500 MG TABS, Take by mouth., Disp: , Rfl:  .  Multiple Vitamins-Minerals (MULTIVITAMIN ADULT PO), Take by mouth., Disp: , Rfl:  .  omeprazole (PRILOSEC) 20 MG capsule, TAKE 1 CAPSULE BY MOUTH 2  TIMES DAILY BEFORE MEALS, Disp: 180 capsule, Rfl: 1 .  pantoprazole (PROTONIX) 40 MG tablet, Take 1 tablet (40 mg total) by mouth 2 (two) times daily., Disp: 180 tablet, Rfl: 1 .  potassium chloride SA (KLOR-CON) 20 MEQ tablet, TAKE 1 TABLET BY MOUTH  DAILY, Disp: 90 tablet, Rfl: 1 .  sildenafil (VIAGRA) 100 MG tablet, Take 1 tablet (100 mg total) by mouth daily as needed., Disp: 10 tablet, Rfl: 11  Review of Systems:  Constitutional: Denies fever, chills, diaphoresis, appetite change and fatigue.  HEENT: Denies photophobia, eye pain, redness, hearing loss, ear pain, congestion, sore throat, rhinorrhea, sneezing, mouth sores, trouble swallowing, neck pain, neck stiffness and tinnitus.   Respiratory: Denies SOB, DOE, cough, chest tightness,  and wheezing.   Cardiovascular: Denies chest pain, palpitations and leg swelling.  Gastrointestinal: Denies nausea, vomiting,diarrhea,  constipation, blood in stool and abdominal distention.  Genitourinary: Denies dysuria, urgency, frequency, hematuria, flank pain and difficulty urinating.  Endocrine: Denies: hot or cold intolerance, sweats, changes in hair or nails, polyuria, polydipsia. Musculoskeletal: Denies myalgias, back pain, joint swelling, arthralgias and gait problem.  Skin: Denies pallor, rash and wound.  Neurological: Denies dizziness, seizures, syncope, weakness, light-headedness, numbness and headaches.  Hematological: Denies adenopathy. Easy bruising, personal or family bleeding history  Psychiatric/Behavioral: Denies suicidal ideation, mood changes, confusion, nervousness, sleep disturbance and agitation    Physical Exam: Vitals:   12/31/20 0746  BP: 135/80  Pulse: 71  Temp: 98.3 F (36.8 C)  TempSrc: Oral  SpO2: 98%  Weight: 192 lb 1.6 oz (87.1 kg)  Height: 5' 10.25" (1.784 m)    Body mass index is 27.37 kg/m.   Constitutional: NAD, calm, comfortable Eyes: PERRL, lids and conjunctivae normal,  wears corrective lenses ENMT: Mucous membranes are moist. Posterior pharynx clear of any exudate or lesions. Normal dentition. Tympanic membrane is pearly white, no erythema or bulging. Neck: normal, supple, no masses, no thyromegaly Respiratory: clear to auscultation bilaterally, no wheezing, no crackles. Normal respiratory effort. No accessory muscle use.  Cardiovascular: Regular rate and rhythm, no murmurs / rubs / gallops. No extremity edema. 2+ pedal pulses.   Abdomen: no tenderness, no masses palpated. No hepatosplenomegaly. Bowel sounds positive.  Musculoskeletal: no clubbing / cyanosis. No joint deformity upper and lower extremities. Good ROM, no contractures. Normal muscle tone.  Skin: no rashes, lesions, ulcers. No induration Neurologic: CN 2-12 grossly intact. Sensation intact, DTR normal. Strength 5/5 in all 4.  Psychiatric: Normal judgment and insight. Alert and oriented x 3. Normal mood.     Subsequent Medicare wellness visit   1. Risk factors, based on past  M,S,F -cardiovascular disease risk factors include age, gender, history of hypertension, history of hyperlipidemia   2.  Physical activities: Walks 30 minutes 3-4 times a week, more limited now due to ongoing back pain   3.  Depression/mood:  Stable, not depressed   4.  Hearing:  No perceived issues   5.  ADL's: Independent in all ADLs   6.  Fall risk:  Low fall risk   7.  Home safety: No problems identified   8.  Height weight, and visual acuity: height and weight as above, vision:   Visual Acuity Screening   Right eye Left eye Both eyes  Without correction:     With correction: '20/25 20/25 20/25 '     9.  Counseling:  Advised that he immediately stop all NSAID use   10. Lab orders based on risk factors: Laboratory update will be reviewed   11. Referral :  None today   12. Care plan:  Follow-up with me in 3 months   13. Cognitive assessment:  No cognitive impairment   14. Screening: Patient provided with a written and personalized 5-10 year screening schedule in the AVS.   yes   15. Provider List Update:   PCP, neurologist  10. Advance Directives: Full code   [facility] Office Visit from 12/31/2020 in [place] at [place]  PHQ-9 Total Score 0      Fall Risk  12/31/2020 07/18/2019 05/02/2019 12/07/2016 09/17/2016  Falls in the past year? 0 0 0 No Yes  Number falls in past yr: 0 0 0 - 1  Injury with Fall? 0 0 0 - No  Risk for fall due to : - - - - Other (Comment)  Follow up - - - - Falls evaluation completed;Education provided;Falls prevention discussed     Impression and Plan:  Encounter for preventive health examination -He has routine eye and dental care. -All immunizations are up-to-date, he is scheduled for his second shingles vaccine in February. -Screening labs today. -Healthy lifestyle discussed in detail. -He had a colonoscopy in 2016 and is a 10-year callback. -His  PSA is followed by his urologist due to his history of prostate cancer.  Hypokalemia -Check basic metabolic profile today.  Hypomagnesemia  - Plan: Magnesium  Gastroesophageal reflux disease without esophagitis -This is his main concern today, symptoms are still not resolved. -I have asked him to discontinue all NSAID use, he has been using Aleve on an almost daily basis which is certainly not helping. -I have given him a list of foods to avoid. -Discontinue Pepcid and Prilosec OTC. -Start Protonix 40 mg twice  daily. -If no improvement over the next 8 weeks or so, consider referral back to GI for consideration of repeat upper endoscopy.  Hyperlipidemia, unspecified hyperlipidemia type  - Plan: Lipid panel  Essential hypertension -Well-controlled on amlodipine 10 mg, hydrochlorothiazide 25 mg and losartan 100 mg.  Malignant neoplasm of prostate ([place]) -Followed by urology and radiation  oncology status post seed implants.   Patient Instructions  -Nice seeing you today!!  -Lab work today; will notify you once results are available.  -STOP Aleve.  -Start Protonix 40 mg twice daily (STOP pepcid and prilosec).  -Schedule follow up in 3 months.   Preventive Care 69 Years and Older, Male Preventive care refers to lifestyle choices and visits with your health care provider that can promote health and wellness. This includes:  A yearly physical exam. This is also called an annual well check.  Regular dental and eye exams.  Immunizations.  Screening for certain conditions.  Healthy lifestyle choices, such as diet and exercise. What can I expect for my preventive care visit? Physical exam Your health care provider will check:  Height and weight. These may be used to calculate body mass index (BMI), which is a measurement that tells if you are at a healthy weight.  Heart rate and blood pressure.  Your skin for abnormal spots. Counseling Your health care provider may ask  you questions about:  Alcohol, tobacco, and drug use.  Emotional well-being.  Home and relationship well-being.  Sexual activity.  Eating habits.  History of falls.  Memory and ability to understand (cognition).  Work and work Statistician. What immunizations do I need?  Influenza (flu) vaccine  This is recommended every year. Tetanus, diphtheria, and pertussis (Tdap) vaccine  You may need a Td booster every 10 years. Varicella (chickenpox) vaccine  You may need this vaccine if you have not already been vaccinated. Zoster (shingles) vaccine  You may need this after age 74. Pneumococcal conjugate (PCV13) vaccine  One dose is recommended after age 61. Pneumococcal polysaccharide (PPSV23) vaccine  One dose is recommended after age 5. Measles, mumps, and rubella (MMR) vaccine  You may need at least one dose of MMR if you were born in 1957 or later. You may also need a second dose. Meningococcal conjugate (MenACWY) vaccine  You may need this if you have certain conditions. Hepatitis A vaccine  You may need this if you have certain conditions or if you travel or work in places where you may be exposed to hepatitis A. Hepatitis B vaccine  You may need this if you have certain conditions or if you travel or work in places where you may be exposed to hepatitis B. Haemophilus influenzae type b (Hib) vaccine  You may need this if you have certain conditions. You may receive vaccines as individual doses or as more than one vaccine together in one shot (combination vaccines). Talk with your health care provider about the risks and benefits of combination vaccines. What tests do I need? Blood tests  Lipid and cholesterol levels. These may be checked every 5 years, or more frequently depending on your overall health.  Hepatitis C test.  Hepatitis B test. Screening  Lung cancer screening. You may have this screening every year starting at age 31 if you have a  30-pack-year history of smoking and currently smoke or have quit within the past 15 years.  Colorectal cancer screening. All adults should have this screening starting at age 65 and continuing until age 33. Your health care  provider may recommend screening at age 61 if you are at increased risk. You will have tests every 1-10 years, depending on your results and the type of screening test.  Prostate cancer screening. Recommendations will vary depending on your family history and other risks.  Diabetes screening. This is done by checking your blood sugar (glucose) after you have not eaten for a while (fasting). You may have this done every 1-3 years.  Abdominal aortic aneurysm (AAA) screening. You may need this if you are a current or former smoker.  Sexually transmitted disease (STD) testing. Follow these instructions at home: Eating and drinking  Eat a diet that includes fresh fruits and vegetables, whole grains, lean protein, and low-fat dairy products. Limit your intake of foods with high amounts of sugar, saturated fats, and salt.  Take vitamin and mineral supplements as recommended by your health care provider.  Do not drink alcohol if your health care provider tells you not to drink.  If you drink alcohol: ? Limit how much you have to 0-2 drinks a day. ? Be aware of how much alcohol is in your drink. In the [place] one drink equals one 12 oz bottle of beer (355 mL), one 5 oz glass of wine (148 mL), or one 1 oz glass of hard liquor (44 mL). Lifestyle  Take daily care of your teeth and gums.  Stay active. Exercise for at least 30 minutes on 5 or more days each week.  Do not use any products that contain nicotine or tobacco, such as cigarettes, e-cigarettes, and chewing tobacco. If you need help quitting, ask your health care provider.  If you are sexually active, practice safe sex. Use a condom or other form of protection to prevent STIs (sexually transmitted infections).  Talk  with your health care provider about taking a low-dose aspirin or statin. What's next?  Visit your health care provider once a year for a well check visit.  Ask your health care provider how often you should have your eyes and teeth checked.  Stay up to date on all vaccines. This information is not intended to replace advice given to you by your health care provider. Make sure you discuss any questions you have with your health care provider. Document Revised: 12/07/2018 Document Reviewed: 12/07/2018 Elsevier Patient Education  [place].   Gastroesophageal Reflux Disease, Adult Gastroesophageal reflux (GER) happens when acid from the stomach flows up into the tube that connects the mouth and the stomach (esophagus). Normally, food travels down the esophagus and stays in the stomach to be digested. With GER, food and stomach acid sometimes move back up into the esophagus. You may have a disease called gastroesophageal reflux disease (GERD) if the reflux:  Happens often.  Causes frequent or very bad symptoms.  Causes problems such as damage to the esophagus. When this happens, the esophagus becomes sore and swollen (inflamed). Over time, GERD can make small holes (ulcers) in the lining of the esophagus. What are the causes? This condition is caused by a problem with the muscle between the esophagus and the stomach. When this muscle is weak or not normal, it does not close properly to keep food and acid from coming back up from the stomach. The muscle can be weak because of:  Tobacco use.  Pregnancy.  Having a certain type of hernia (hiatal hernia).  Alcohol use.  Certain foods and drinks, such as coffee, chocolate, onions, and peppermint. What increases the risk? You are more likely to  develop this condition if you:  Are overweight.  Have a disease that affects your connective tissue.  Use NSAID medicines. What are the signs or symptoms? Symptoms of this condition  include:  Heartburn.  Difficult or painful swallowing.  The feeling of having a lump in the throat.  A bitter taste in the mouth.  Bad breath.  Having a lot of saliva.  Having an upset or bloated stomach.  Belching.  Chest pain. Different conditions can cause chest pain. Make sure you see your doctor if you have chest pain.  Shortness of breath or noisy breathing (wheezing).  Ongoing (chronic) cough or a cough at night.  Wearing away of the surface of teeth (tooth enamel).  Weight loss. How is this treated? Treatment will depend on how bad your symptoms are. Your doctor may suggest:  Changes to your diet.  Medicine.  Surgery. Follow these instructions at home: Eating and drinking   Follow a diet as told by your doctor. You may need to avoid foods and drinks such as: ? Coffee and tea (with or without caffeine). ? Drinks that contain alcohol. ? Energy drinks and sports drinks. ? Bubbly (carbonated) drinks or sodas. ? Chocolate and cocoa. ? Peppermint and mint flavorings. ? Garlic and onions. ? Horseradish. ? Spicy and acidic foods. These include peppers, chili powder, curry powder, vinegar, hot sauces, and BBQ sauce. ? Citrus fruit juices and citrus fruits, such as oranges, lemons, and limes. ? Tomato-based foods. These include red sauce, chili, salsa, and pizza with red sauce. ? Fried and fatty foods. These include donuts, french fries, potato chips, and high-fat dressings. ? High-fat meats. These include hot dogs, rib eye steak, sausage, ham, and bacon. ? High-fat dairy items, such as whole milk, butter, and cream cheese.  Eat small meals often. Avoid eating large meals.  Avoid drinking large amounts of liquid with your meals.  Avoid eating meals during the 2-3 hours before bedtime.  Avoid lying down right after you eat.  Do not exercise right after you eat. Lifestyle   Do not use any products that contain nicotine or tobacco. These include  cigarettes, e-cigarettes, and chewing tobacco. If you need help quitting, ask your doctor.  Try to lower your stress. If you need help doing this, ask your doctor.  If you are overweight, lose an amount of weight that is healthy for you. Ask your doctor about a safe weight loss goal. General instructions  Pay attention to any changes in your symptoms.  Take over-the-counter and prescription medicines only as told by your doctor. Do not take aspirin, ibuprofen, or other NSAIDs unless your doctor says it is okay.  Wear loose clothes. Do not wear anything tight around your waist.  Raise (elevate) the head of your bed about 6 inches (15 cm).  Avoid bending over if this makes your symptoms worse.  Keep all follow-up visits as told by your doctor. This is important. Contact a doctor if:  You have new symptoms.  You lose weight and you do not know why.  You have trouble swallowing or it hurts to swallow.  You have wheezing or a cough that keeps happening.  Your symptoms do not get better with treatment.  You have a hoarse voice. Get help right away if:  You have pain in your arms, neck, jaw, teeth, or back.  You feel sweaty, dizzy, or light-headed.  You have chest pain or shortness of breath.  You throw up (vomit) and your throw-up looks  like blood or coffee grounds.  You pass out (faint).  Your poop (stool) is bloody or black.  You cannot swallow, drink, or eat. Summary  If a person has gastroesophageal reflux disease (GERD), food and stomach acid move back up into the esophagus and cause symptoms or problems such as damage to the esophagus.  Treatment will depend on how bad your symptoms are.  Follow a diet as told by your doctor.  Take all medicines only as told by your doctor. This information is not intended to replace advice given to you by your health care provider. Make sure you discuss any questions you have with your health care provider. Document Revised:  06/21/2018 Document Reviewed: 06/21/2018 Elsevier Patient Education  2020 [place], [place] [facility]

## 2020-12-31 NOTE — Patient Instructions (Signed)
-Nice seeing you today!!  -Lab work today; will notify you once results are available.  -STOP Aleve.  -Start Protonix 40 mg twice daily (STOP pepcid and prilosec).  -Schedule follow up in 3 months.   Preventive Care 22 Years and Older, Male Preventive care refers to lifestyle choices and visits with your health care provider that can promote health and wellness. This includes:  A yearly physical exam. This is also called an annual well check.  Regular dental and eye exams.  Immunizations.  Screening for certain conditions.  Healthy lifestyle choices, such as diet and exercise. What can I expect for my preventive care visit? Physical exam Your health care provider will check:  Height and weight. These may be used to calculate body mass index (BMI), which is a measurement that tells if you are at a healthy weight.  Heart rate and blood pressure.  Your skin for abnormal spots. Counseling Your health care provider may ask you questions about:  Alcohol, tobacco, and drug use.  Emotional well-being.  Home and relationship well-being.  Sexual activity.  Eating habits.  History of falls.  Memory and ability to understand (cognition).  Work and work Statistician. What immunizations do I need?  Influenza (flu) vaccine  This is recommended every year. Tetanus, diphtheria, and pertussis (Tdap) vaccine  You may need a Td booster every 10 years. Varicella (chickenpox) vaccine  You may need this vaccine if you have not already been vaccinated. Zoster (shingles) vaccine  You may need this after age 75. Pneumococcal conjugate (PCV13) vaccine  One dose is recommended after age 75. Pneumococcal polysaccharide (PPSV23) vaccine  One dose is recommended after age 75. Measles, mumps, and rubella (MMR) vaccine  You may need at least one dose of MMR if you were born in 1957 or later. You may also need a second dose. Meningococcal conjugate (MenACWY) vaccine  You may  need this if you have certain conditions. Hepatitis A vaccine  You may need this if you have certain conditions or if you travel or work in places where you may be exposed to hepatitis A. Hepatitis B vaccine  You may need this if you have certain conditions or if you travel or work in places where you may be exposed to hepatitis B. Haemophilus influenzae type b (Hib) vaccine  You may need this if you have certain conditions. You may receive vaccines as individual doses or as more than one vaccine together in one shot (combination vaccines). Talk with your health care provider about the risks and benefits of combination vaccines. What tests do I need? Blood tests  Lipid and cholesterol levels. These may be checked every 5 years, or more frequently depending on your overall health.  Hepatitis C test.  Hepatitis B test. Screening  Lung cancer screening. You may have this screening every year starting at age 75 if you have a 30-pack-year history of smoking and currently smoke or have quit within the past 15 years.  Colorectal cancer screening. All adults should have this screening starting at age 75 and continuing until age 75. Your health care provider may recommend screening at age 36 if you are at increased risk. You will have tests every 1-10 years, depending on your results and the type of screening test.  Prostate cancer screening. Recommendations will vary depending on your family history and other risks.  Diabetes screening. This is done by checking your blood sugar (glucose) after you have not eaten for a while (fasting). You may have this  done every 1-3 years.  Abdominal aortic aneurysm (AAA) screening. You may need this if you are a current or former smoker.  Sexually transmitted disease (STD) testing. Follow these instructions at home: Eating and drinking  Eat a diet that includes fresh fruits and vegetables, whole grains, lean protein, and low-fat dairy products. Limit  your intake of foods with high amounts of sugar, saturated fats, and salt.  Take vitamin and mineral supplements as recommended by your health care provider.  Do not drink alcohol if your health care provider tells you not to drink.  If you drink alcohol: ? Limit how much you have to 0-2 drinks a day. ? Be aware of how much alcohol is in your drink. In the [place] one drink equals one 12 oz bottle of beer (355 mL), one 5 oz glass of wine (148 mL), or one 1 oz glass of hard liquor (44 mL). Lifestyle  Take daily care of your teeth and gums.  Stay active. Exercise for at least 30 minutes on 5 or more days each week.  Do not use any products that contain nicotine or tobacco, such as cigarettes, e-cigarettes, and chewing tobacco. If you need help quitting, ask your health care provider.  If you are sexually active, practice safe sex. Use a condom or other form of protection to prevent STIs (sexually transmitted infections).  Talk with your health care provider about taking a low-dose aspirin or statin. What's next?  Visit your health care provider once a year for a well check visit.  Ask your health care provider how often you should have your eyes and teeth checked.  Stay up to date on all vaccines. This information is not intended to replace advice given to you by your health care provider. Make sure you discuss any questions you have with your health care provider. Document Revised: 12/07/2018 Document Reviewed: 12/07/2018 Elsevier Patient Education  [place].   Gastroesophageal Reflux Disease, Adult Gastroesophageal reflux (GER) happens when acid from the stomach flows up into the tube that connects the mouth and the stomach (esophagus). Normally, food travels down the esophagus and stays in the stomach to be digested. With GER, food and stomach acid sometimes move back up into the esophagus. You may have a disease called gastroesophageal reflux disease (GERD) if the  reflux:  Happens often.  Causes frequent or very bad symptoms.  Causes problems such as damage to the esophagus. When this happens, the esophagus becomes sore and swollen (inflamed). Over time, GERD can make small holes (ulcers) in the lining of the esophagus. What are the causes? This condition is caused by a problem with the muscle between the esophagus and the stomach. When this muscle is weak or not normal, it does not close properly to keep food and acid from coming back up from the stomach. The muscle can be weak because of:  Tobacco use.  Pregnancy.  Having a certain type of hernia (hiatal hernia).  Alcohol use.  Certain foods and drinks, such as coffee, chocolate, onions, and peppermint. What increases the risk? You are more likely to develop this condition if you:  Are overweight.  Have a disease that affects your connective tissue.  Use NSAID medicines. What are the signs or symptoms? Symptoms of this condition include:  Heartburn.  Difficult or painful swallowing.  The feeling of having a lump in the throat.  A bitter taste in the mouth.  Bad breath.  Having a lot of saliva.  Having an upset  or bloated stomach.  Belching.  Chest pain. Different conditions can cause chest pain. Make sure you see your doctor if you have chest pain.  Shortness of breath or noisy breathing (wheezing).  Ongoing (chronic) cough or a cough at night.  Wearing away of the surface of teeth (tooth enamel).  Weight loss. How is this treated? Treatment will depend on how bad your symptoms are. Your doctor may suggest:  Changes to your diet.  Medicine.  Surgery. Follow these instructions at home: Eating and drinking   Follow a diet as told by your doctor. You may need to avoid foods and drinks such as: ? Coffee and tea (with or without caffeine). ? Drinks that contain alcohol. ? Energy drinks and sports drinks. ? Bubbly (carbonated) drinks or sodas. ? Chocolate  and cocoa. ? Peppermint and mint flavorings. ? Garlic and onions. ? Horseradish. ? Spicy and acidic foods. These include peppers, chili powder, curry powder, vinegar, hot sauces, and BBQ sauce. ? Citrus fruit juices and citrus fruits, such as oranges, lemons, and limes. ? Tomato-based foods. These include red sauce, chili, salsa, and pizza with red sauce. ? Fried and fatty foods. These include donuts, french fries, potato chips, and high-fat dressings. ? High-fat meats. These include hot dogs, rib eye steak, sausage, ham, and bacon. ? High-fat dairy items, such as whole milk, butter, and cream cheese.  Eat small meals often. Avoid eating large meals.  Avoid drinking large amounts of liquid with your meals.  Avoid eating meals during the 2-3 hours before bedtime.  Avoid lying down right after you eat.  Do not exercise right after you eat. Lifestyle   Do not use any products that contain nicotine or tobacco. These include cigarettes, e-cigarettes, and chewing tobacco. If you need help quitting, ask your doctor.  Try to lower your stress. If you need help doing this, ask your doctor.  If you are overweight, lose an amount of weight that is healthy for you. Ask your doctor about a safe weight loss goal. General instructions  Pay attention to any changes in your symptoms.  Take over-the-counter and prescription medicines only as told by your doctor. Do not take aspirin, ibuprofen, or other NSAIDs unless your doctor says it is okay.  Wear loose clothes. Do not wear anything tight around your waist.  Raise (elevate) the head of your bed about 6 inches (15 cm).  Avoid bending over if this makes your symptoms worse.  Keep all follow-up visits as told by your doctor. This is important. Contact a doctor if:  You have new symptoms.  You lose weight and you do not know why.  You have trouble swallowing or it hurts to swallow.  You have wheezing or a cough that keeps  happening.  Your symptoms do not get better with treatment.  You have a hoarse voice. Get help right away if:  You have pain in your arms, neck, jaw, teeth, or back.  You feel sweaty, dizzy, or light-headed.  You have chest pain or shortness of breath.  You throw up (vomit) and your throw-up looks like blood or coffee grounds.  You pass out (faint).  Your poop (stool) is bloody or black.  You cannot swallow, drink, or eat. Summary  If a person has gastroesophageal reflux disease (GERD), food and stomach acid move back up into the esophagus and cause symptoms or problems such as damage to the esophagus.  Treatment will depend on how bad your symptoms are.  Follow a  diet as told by your doctor.  Take all medicines only as told by your doctor. This information is not intended to replace advice given to you by your health care provider. Make sure you discuss any questions you have with your health care provider. Document Revised: 06/21/2018 Document Reviewed: 06/21/2018 Elsevier Patient Education  [place].

## 2021-01-02 DIAGNOSIS — I10 Essential (primary) hypertension: Secondary | ICD-10-CM | POA: Diagnosis not present

## 2021-01-02 DIAGNOSIS — M48062 Spinal stenosis, lumbar region with neurogenic claudication: Secondary | ICD-10-CM | POA: Diagnosis not present

## 2021-01-19 ENCOUNTER — Encounter: Payer: Self-pay | Admitting: Internal Medicine

## 2021-01-22 ENCOUNTER — Other Ambulatory Visit: Payer: Self-pay | Admitting: Family Medicine

## 2021-01-22 ENCOUNTER — Other Ambulatory Visit: Payer: Self-pay | Admitting: Internal Medicine

## 2021-01-22 DIAGNOSIS — I10 Essential (primary) hypertension: Secondary | ICD-10-CM

## 2021-03-30 ENCOUNTER — Other Ambulatory Visit: Payer: Self-pay

## 2021-03-31 ENCOUNTER — Ambulatory Visit (INDEPENDENT_AMBULATORY_CARE_PROVIDER_SITE_OTHER): Payer: Medicare Other | Admitting: Internal Medicine

## 2021-03-31 ENCOUNTER — Encounter: Payer: Self-pay | Admitting: Internal Medicine

## 2021-03-31 VITALS — BP 130/80 | HR 77 | Temp 98.2°F | Ht 70.5 in | Wt 197.8 lb

## 2021-03-31 DIAGNOSIS — J302 Other seasonal allergic rhinitis: Secondary | ICD-10-CM | POA: Diagnosis not present

## 2021-03-31 DIAGNOSIS — K219 Gastro-esophageal reflux disease without esophagitis: Secondary | ICD-10-CM | POA: Diagnosis not present

## 2021-03-31 MED ORDER — PANTOPRAZOLE SODIUM 40 MG PO TBEC: 40.0000 mg | DELAYED_RELEASE_TABLET | Freq: Every day | ORAL | 1 refills | Status: DC

## 2021-03-31 NOTE — Progress Notes (Signed)
Established Patient Office Visit     This visit occurred during the SARS-CoV-2 public health emergency.  Safety protocols were in place, including screening questions prior to the visit, additional usage of staff PPE, and extensive cleaning of exam room while observing appropriate contact time as indicated for disinfecting solutions.    CC/Reason for Visit: Follow-up GERD  HPI: Antonio Newton is a 75 y.o. male who is coming in today for the above mentioned reasons. Past Medical History is significant for: Hypertension, GERD that has not been well controlled on PPI therapy, status post cervical fusion with lumbar spinal stenosis with neuropathy on gabapentin.  During his last visit in January he complained of significant reflux despite using Prilosec and OTC Pepcid.  He had been taking Aleve every day for his back issues.  We discussed discontinuation of all NSAIDs and starting Protonix 40 mg twice daily which she has.  He has had significant improvement.  He is also complaining of resurgence of his spring allergies with runny nose and postnasal drip, watery eyes, common for him this time of year.  He started taking his levocetirizine a few days ago.   Past Medical/Surgical History: Past Medical History:  Diagnosis Date  . Allergy   . Anxiety   . ED (erectile dysfunction)   . Hypertension   . Incarcerated right inguinal hernia 12/23/2016  . Neuromuscular disorder (HCC)    neuropathy - mildly in bil hands-is improved since neck surgery  . OA (osteoarthritis)    left hip  . Pneumonia YRS AGO  . Prostate cancer ([place])   . Spinal stenosis     Past Surgical History:  Procedure Laterality Date  . ANTERIOR CERVICAL DECOMP/DISCECTOMY FUSION N/A 02/06/2016   Procedure: Cevical three-four, Cervical four-five, Cervical five-six anterior cervical decompression with fusion interbody prosthesis plating and bonegraft;  Surgeon: Consuella Lose, MD;  Location: [facility];  Service:  Neurosurgery;  Laterality: N/A;  . COLONOSCOPY    . CYSTOSCOPY N/A 03/17/2020   Procedure: CYSTOSCOPY;  Surgeon: Raynelle Bring, MD;  Location: [facility];  Service: Urology;  Laterality: N/A;  . INGUINAL HERNIA REPAIR Right 12/23/2016   Procedure: OPEN REPAIR RIGHT INGUINAL HERNIA WITH MESH;  Surgeon: Fanny Skates, MD;  Location: [facility];  Service: General;  Laterality: Right;  . INSERTION OF MESH Right 12/23/2016   Procedure: INSERTION OF MESH;  Surgeon: Fanny Skates, MD;  Location: [facility];  Service: General;  Laterality: Right;  . None    . postate biopsy    . RADIOACTIVE SEED IMPLANT N/A 03/17/2020   Procedure: RADIOACTIVE SEED IMPLANT/BRACHYTHERAPY IMPLANT;  Surgeon: Raynelle Bring, MD;  Location: [facility];  Service: Urology;  Laterality: N/A;  . SPACE OAR INSTILLATION N/A 03/17/2020   Procedure: SPACE OAR INSTILLATION;  Surgeon: Raynelle Bring, MD;  Location: [facility];  Service: Urology;  Laterality: N/A;  . steroid injection to back  03/11/2020    Social History:  reports that he has never smoked. He has never used smokeless tobacco. He reports current alcohol use. He reports that he does not use drugs.  Allergies: No Known Allergies  Family History:  Family History  Problem Relation Age of Onset  . Hypertension Father        Deceased, 4  . Prostate cancer Father   . Hypertension Mother        Deceased, 87  . Renal cancer Mother        mets to lung  .  Colon cancer Mother   . Healthy Sister   . Esophageal cancer Paternal Grandfather   . Rectal cancer Neg Hx   . Stomach cancer Neg Hx   . Breast cancer Neg Hx   . Pancreatic cancer Neg Hx      Current Outpatient Medications:  .  amLODipine (NORVASC) 10 MG tablet, TAKE 1 TABLET BY MOUTH  DAILY, Disp: 90 tablet, Rfl: 1 .  docusate sodium (COLACE) 100 MG capsule, Take 1 capsule (100 mg total) by mouth 2 (two) times daily., Disp: 30 capsule, Rfl: 0 .  gabapentin  (NEURONTIN) 300 MG capsule, TAKE 1 CAPSULE BY MOUTH  TWICE DAILY, Disp: 180 capsule, Rfl: 3 .  Glycerin-Hypromellose-PEG 400 0.2-0.36-1 % SOLN, Place 1-2 drops into both eyes 3 (three) times daily as needed (for dry/tired eyes.)., Disp: , Rfl:  .  hydrochlorothiazide (HYDRODIURIL) 25 MG tablet, TAKE 1 TABLET BY MOUTH  DAILY, Disp: 90 tablet, Rfl: 3 .  levocetirizine (XYZAL) 5 MG tablet, TAKE 1 TABLET BY MOUTH IN  THE EVENING, Disp: 90 tablet, Rfl: 1 .  losartan (COZAAR) 100 MG tablet, TAKE 1 TABLET BY MOUTH  DAILY, Disp: 90 tablet, Rfl: 3 .  Magnesium 500 MG TABS, Take by mouth., Disp: , Rfl:  .  Multiple Vitamins-Minerals (MULTIVITAMIN ADULT PO), Take by mouth., Disp: , Rfl:  .  potassium chloride SA (KLOR-CON) 20 MEQ tablet, TAKE 1 TABLET BY MOUTH  DAILY, Disp: 90 tablet, Rfl: 1 .  sildenafil (VIAGRA) 100 MG tablet, Take 1 tablet (100 mg total) by mouth daily as needed., Disp: 10 tablet, Rfl: 11 .  pantoprazole (PROTONIX) 40 MG tablet, Take 1 tablet (40 mg total) by mouth daily., Disp: 90 tablet, Rfl: 1  Review of Systems:  Constitutional: Denies fever, chills, diaphoresis, appetite change and fatigue.  HEENT: Denies photophobia, eye pain, redness, hearing loss, ear pain, congestion, sore throat,  mouth sores, trouble swallowing, neck pain, neck stiffness and tinnitus.   Respiratory: Denies SOB, DOE, cough, chest tightness,  and wheezing.   Cardiovascular: Denies chest pain, palpitations and leg swelling.  Gastrointestinal: Denies nausea, vomiting, abdominal pain, diarrhea, constipation, blood in stool and abdominal distention.  Genitourinary: Denies dysuria, urgency, frequency, hematuria, flank pain and difficulty urinating.  Endocrine: Denies: hot or cold intolerance, sweats, changes in hair or nails, polyuria, polydipsia. Musculoskeletal: Denies myalgias, back pain, joint swelling, arthralgias and gait problem.  Skin: Denies pallor, rash and wound.  Neurological: Denies dizziness, seizures,  syncope, weakness, light-headedness, numbness and headaches.  Hematological: Denies adenopathy. Easy bruising, personal or family bleeding history  Psychiatric/Behavioral: Denies suicidal ideation, mood changes, confusion, nervousness, sleep disturbance and agitation    Physical Exam: Vitals:   03/31/21 0725  BP: 130/80  Pulse: 77  Temp: 98.2 F (36.8 C)  TempSrc: Oral  SpO2: 98%  Weight: 197 lb 12.8 oz (89.7 kg)  Height: 5' 10.5" (1.791 m)    Body mass index is 27.98 kg/m.   Constitutional: NAD, calm, comfortable Eyes: PERRL, lids and conjunctivae normal ENMT: Mucous membranes are moist.  Respiratory: clear to auscultation bilaterally, no wheezing, no crackles. Normal respiratory effort. No accessory muscle use.  Cardiovascular: Regular rate and rhythm, no murmurs / rubs / gallops. No extremity edema.  Neurologic: Grossly intact and nonfocal Psychiatric: Normal judgment and insight. Alert and oriented x 3. Normal mood.    Impression and Plan:  Gastroesophageal reflux disease without esophagitis -With significant improvement on twice daily PPI therapy. -We have agreed to decrease dose to daily, he will continue  to avoid NSAIDs as much as possible.  Seasonal allergies -Agree with starting levocetirizine.     Lelon Frohlich, MD [facility]

## 2021-05-01 ENCOUNTER — Other Ambulatory Visit: Payer: Self-pay | Admitting: Internal Medicine

## 2021-05-29 ENCOUNTER — Encounter: Payer: Self-pay | Admitting: Internal Medicine

## 2021-06-15 ENCOUNTER — Encounter: Payer: Self-pay | Admitting: Internal Medicine

## 2021-06-24 LAB — PSA: PSA: 0.84

## 2021-07-01 DIAGNOSIS — R3912 Poor urinary stream: Secondary | ICD-10-CM | POA: Diagnosis not present

## 2021-07-03 ENCOUNTER — Encounter: Payer: Self-pay | Admitting: Internal Medicine

## 2021-08-01 ENCOUNTER — Other Ambulatory Visit: Payer: Self-pay | Admitting: Internal Medicine

## 2021-09-14 ENCOUNTER — Encounter: Payer: Self-pay | Admitting: Internal Medicine

## 2021-09-14 DIAGNOSIS — K219 Gastro-esophageal reflux disease without esophagitis: Secondary | ICD-10-CM

## 2021-09-15 MED ORDER — PANTOPRAZOLE SODIUM 40 MG PO TBEC: 40.0000 mg | DELAYED_RELEASE_TABLET | Freq: Every day | ORAL | 1 refills | Status: DC

## 2021-09-30 ENCOUNTER — Encounter: Payer: Self-pay | Admitting: Internal Medicine

## 2021-10-13 ENCOUNTER — Other Ambulatory Visit: Payer: Self-pay

## 2021-10-13 ENCOUNTER — Ambulatory Visit (INDEPENDENT_AMBULATORY_CARE_PROVIDER_SITE_OTHER): Payer: Medicare Other

## 2021-10-13 DIAGNOSIS — Z23 Encounter for immunization: Secondary | ICD-10-CM

## 2021-10-28 ENCOUNTER — Other Ambulatory Visit: Payer: Self-pay

## 2021-10-28 ENCOUNTER — Ambulatory Visit (INDEPENDENT_AMBULATORY_CARE_PROVIDER_SITE_OTHER): Payer: Medicare Other | Admitting: Internal Medicine

## 2021-10-28 ENCOUNTER — Other Ambulatory Visit: Payer: Self-pay | Admitting: Internal Medicine

## 2021-10-28 VITALS — BP 130/80 | HR 72 | Temp 98.5°F | Ht >= 80 in | Wt 194.3 lb

## 2021-10-28 DIAGNOSIS — M549 Dorsalgia, unspecified: Secondary | ICD-10-CM | POA: Diagnosis not present

## 2021-10-28 NOTE — Progress Notes (Signed)
Acute office Visit     This visit occurred during the SARS-CoV-2 public health emergency.  Safety protocols were in place, including screening questions prior to the visit, additional usage of staff PPE, and extensive cleaning of exam room while observing appropriate contact time as indicated for disinfecting solutions.    CC/Reason for Visit: Back pain  HPI: Antonio Newton is a 75 y.o. male who is coming in today for the above mentioned reasons.  He has been having left upper back pain for a few weeks.  He believes he might have pulled a muscle over his left scapula.  It is difficult to sleep at nighttime because of this.  Past Medical/Surgical History: Past Medical History:  Diagnosis Date   Allergy    Anxiety    ED (erectile dysfunction)    Hypertension    Incarcerated right inguinal hernia 12/23/2016   Neuromuscular disorder (HCC)    neuropathy - mildly in bil hands-is improved since neck surgery   OA (osteoarthritis)    left hip   Pneumonia YRS AGO   Prostate cancer ([place])    Spinal stenosis     Past Surgical History:  Procedure Laterality Date   ANTERIOR CERVICAL DECOMP/DISCECTOMY FUSION N/A 02/06/2016   Procedure: Cevical three-four, Cervical four-five, Cervical five-six anterior cervical decompression with fusion interbody prosthesis plating and bonegraft;  Surgeon: Consuella Lose, MD;  Location: [facility];  Service: Neurosurgery;  Laterality: N/A;   COLONOSCOPY     CYSTOSCOPY N/A 03/17/2020   Procedure: CYSTOSCOPY;  Surgeon: Raynelle Bring, MD;  Location: [facility];  Service: Urology;  Laterality: N/A;   INGUINAL HERNIA REPAIR Right 12/23/2016   Procedure: OPEN REPAIR RIGHT INGUINAL HERNIA WITH MESH;  Surgeon: Fanny Skates, MD;  Location: [facility];  Service: General;  Laterality: Right;   INSERTION OF MESH Right 12/23/2016   Procedure: INSERTION OF MESH;  Surgeon: Fanny Skates, MD;  Location: [facility];  Service: General;  Laterality:  Right;   None     postate biopsy     RADIOACTIVE SEED IMPLANT N/A 03/17/2020   Procedure: RADIOACTIVE SEED IMPLANT/BRACHYTHERAPY IMPLANT;  Surgeon: Raynelle Bring, MD;  Location: [facility];  Service: Urology;  Laterality: N/A;   SPACE OAR INSTILLATION N/A 03/17/2020   Procedure: SPACE OAR INSTILLATION;  Surgeon: Raynelle Bring, MD;  Location: [facility];  Service: Urology;  Laterality: N/A;   steroid injection to back  03/11/2020    Social History:  reports that he has never smoked. He has never used smokeless tobacco. He reports current alcohol use. He reports that he does not use drugs.  Allergies: No Known Allergies  Family History:  Family History  Problem Relation Age of Onset   Hypertension Father        Deceased, 57   Prostate cancer Father    Hypertension Mother        Deceased, 58   Renal cancer Mother        mets to lung   Colon cancer Mother    Healthy Sister    Esophageal cancer Paternal Grandfather    Rectal cancer Neg Hx    Stomach cancer Neg Hx    Breast cancer Neg Hx    Pancreatic cancer Neg Hx      Current Outpatient Medications:    amLODipine (NORVASC) 10 MG tablet, TAKE 1 TABLET BY MOUTH  DAILY, Disp: 90 tablet, Rfl: 1   docusate sodium (COLACE) 100 MG capsule, Take 1 capsule (  100 mg total) by mouth 2 (two) times daily., Disp: 30 capsule, Rfl: 0   gabapentin (NEURONTIN) 300 MG capsule, TAKE 1 CAPSULE BY MOUTH  TWICE DAILY, Disp: 180 capsule, Rfl: 3   Glycerin-Hypromellose-PEG 400 0.2-0.36-1 % SOLN, Place 1-2 drops into both eyes 3 (three) times daily as needed (for dry/tired eyes.)., Disp: , Rfl:    hydrochlorothiazide (HYDRODIURIL) 25 MG tablet, TAKE 1 TABLET BY MOUTH  DAILY, Disp: 90 tablet, Rfl: 3   levocetirizine (XYZAL) 5 MG tablet, TAKE 1 TABLET BY MOUTH IN  THE EVENING, Disp: 90 tablet, Rfl: 1   losartan (COZAAR) 100 MG tablet, TAKE 1 TABLET BY MOUTH  DAILY, Disp: 90 tablet, Rfl: 3   Magnesium 500 MG TABS, Take by  mouth., Disp: , Rfl:    Multiple Vitamins-Minerals (MULTIVITAMIN ADULT PO), Take by mouth., Disp: , Rfl:    pantoprazole (PROTONIX) 40 MG tablet, Take 1 tablet (40 mg total) by mouth daily., Disp: 90 tablet, Rfl: 1   potassium chloride SA (KLOR-CON) 20 MEQ tablet, TAKE 1 TABLET BY MOUTH  DAILY, Disp: 90 tablet, Rfl: 1   sildenafil (VIAGRA) 100 MG tablet, Take 1 tablet (100 mg total) by mouth daily as needed., Disp: 10 tablet, Rfl: 11  Review of Systems:  Constitutional: Denies fever, chills, diaphoresis, appetite change and fatigue.  HEENT: Denies photophobia, eye pain, redness, hearing loss, ear pain, congestion, sore throat, rhinorrhea, sneezing, mouth sores, trouble swallowing, neck pain, neck stiffness and tinnitus.   Respiratory: Denies SOB, DOE, cough, chest tightness,  and wheezing.   Cardiovascular: Denies chest pain, palpitations and leg swelling.  Gastrointestinal: Denies nausea, vomiting, abdominal pain, diarrhea, constipation, blood in stool and abdominal distention.  Genitourinary: Denies dysuria, urgency, frequency, hematuria, flank pain and difficulty urinating.  Endocrine: Denies: hot or cold intolerance, sweats, changes in hair or nails, polyuria, polydipsia. Musculoskeletal: Denies  joint swelling, arthralgias and gait problem.  Skin: Denies pallor, rash and wound.  Neurological: Denies dizziness, seizures, syncope, weakness, light-headedness, numbness and headaches.  Hematological: Denies adenopathy. Easy bruising, personal or family bleeding history  Psychiatric/Behavioral: Denies suicidal ideation, mood changes, confusion, nervousness, sleep disturbance and agitation    Physical Exam: Vitals:   10/28/21 1412  BP: 130/80  Pulse: 72  Temp: 98.5 F (36.9 C)  TempSrc: Oral  SpO2: 96%  Weight: 194 lb 4.8 oz (88.1 kg)  Height: (!) 8' 2.5" (2.502 m)    Body mass index is 14.08 kg/m.   Constitutional: NAD, calm, comfortable Eyes: PERRL, lids and conjunctivae  normal ENMT: Mucous membranes are moist.  Neurologic: Grossly intact and nonfocal Psychiatric: Normal judgment and insight. Alert and oriented x 3. Normal mood.    Impression and Plan:  Upper back pain on left side -Suspect musculoskeletal in origin. -Have advised icing, as needed NSAIDs, local massage therapy, OTC diclofenac gel. -Can consider PT if no improvement.    Lelon Frohlich, MD [facility]

## 2021-12-09 ENCOUNTER — Telehealth: Payer: Self-pay

## 2021-12-09 DIAGNOSIS — K219 Gastro-esophageal reflux disease without esophagitis: Secondary | ICD-10-CM

## 2021-12-09 MED ORDER — PANTOPRAZOLE SODIUM 40 MG PO TBEC: 40.0000 mg | DELAYED_RELEASE_TABLET | Freq: Every day | ORAL | 0 refills | Status: DC

## 2021-12-09 NOTE — Telephone Encounter (Signed)
Patient called requesting Rx refill 14 day supply due to misplacing Rx when packing up his home to have floor redone. Patient is asking for Rx to be sent to [facility]  pantoprazole (PROTONIX) 40 MG tablet

## 2021-12-09 NOTE — Addendum Note (Signed)
Addended by: Westley Hummer B on: 12/09/2021 12:56 PM   Modules accepted: Orders

## 2021-12-21 ENCOUNTER — Other Ambulatory Visit: Payer: Self-pay | Admitting: Internal Medicine

## 2022-01-05 ENCOUNTER — Ambulatory Visit (INDEPENDENT_AMBULATORY_CARE_PROVIDER_SITE_OTHER): Payer: Medicare Other | Admitting: Internal Medicine

## 2022-01-05 ENCOUNTER — Encounter: Payer: Self-pay | Admitting: Internal Medicine

## 2022-01-05 VITALS — BP 130/80 | HR 77 | Temp 98.2°F | Ht 70.5 in | Wt 193.1 lb

## 2022-01-05 DIAGNOSIS — K219 Gastro-esophageal reflux disease without esophagitis: Secondary | ICD-10-CM

## 2022-01-05 DIAGNOSIS — E785 Hyperlipidemia, unspecified: Secondary | ICD-10-CM | POA: Diagnosis not present

## 2022-01-05 DIAGNOSIS — Z0000 Encounter for general adult medical examination without abnormal findings: Secondary | ICD-10-CM | POA: Diagnosis not present

## 2022-01-05 DIAGNOSIS — C61 Malignant neoplasm of prostate: Secondary | ICD-10-CM

## 2022-01-05 DIAGNOSIS — I10 Essential (primary) hypertension: Secondary | ICD-10-CM

## 2022-01-05 LAB — CBC WITH DIFFERENTIAL/PLATELET
Basophils Absolute: 0 10*3/uL (ref 0.0–0.1)
Basophils Relative: 0.4 % (ref 0.0–3.0)
Eosinophils Absolute: 0.2 10*3/uL (ref 0.0–0.7)
Eosinophils Relative: 3.5 % (ref 0.0–5.0)
HCT: 44.6 % (ref 39.0–52.0)
Hemoglobin: 15 g/dL (ref 13.0–17.0)
Lymphocytes Relative: 17.7 % (ref 12.0–46.0)
Lymphs Abs: 0.9 10*3/uL (ref 0.7–4.0)
MCHC: 33.7 g/dL (ref 30.0–36.0)
MCV: 87.5 fl (ref 78.0–100.0)
Monocytes Absolute: 0.4 10*3/uL (ref 0.1–1.0)
Monocytes Relative: 7.6 % (ref 3.0–12.0)
Neutro Abs: 3.6 10*3/uL (ref 1.4–7.7)
Neutrophils Relative %: 70.8 % (ref 43.0–77.0)
Platelets: 186 10*3/uL (ref 150.0–400.0)
RBC: 5.09 Mil/uL (ref 4.22–5.81)
RDW: 13 % (ref 11.5–15.5)
WBC: 5.1 10*3/uL (ref 4.0–10.5)

## 2022-01-05 LAB — COMPREHENSIVE METABOLIC PANEL
ALT: 29 U/L (ref 0–53)
AST: 27 U/L (ref 0–37)
Albumin: 4.8 g/dL (ref 3.5–5.2)
Alkaline Phosphatase: 67 U/L (ref 39–117)
BUN: 20 mg/dL (ref 6–23)
CO2: 31 mEq/L (ref 19–32)
Calcium: 9.9 mg/dL (ref 8.4–10.5)
Chloride: 97 mEq/L (ref 96–112)
Creatinine, Ser: 1.18 mg/dL (ref 0.40–1.50)
GFR: 60.55 mL/min (ref >60.00)
Glucose, Bld: 105 mg/dL — ABNORMAL HIGH (ref 70–99)
Potassium: 3.8 mEq/L (ref 3.5–5.1)
Sodium: 139 mEq/L (ref 135–145)
Total Bilirubin: 0.9 mg/dL (ref 0.2–1.2)
Total Protein: 8 g/dL (ref 6.0–8.3)

## 2022-01-05 LAB — LIPID PANEL
Cholesterol: 180 mg/dL (ref 0–200)
HDL: 45.8 mg/dL (ref >39.00)
LDL Cholesterol: 106 mg/dL — ABNORMAL HIGH (ref 0–99)
NonHDL: 134.66
Total CHOL/HDL Ratio: 4
Triglycerides: 145 mg/dL (ref 0.0–149.0)
VLDL: 29 mg/dL (ref 0.0–40.0)

## 2022-01-05 LAB — TSH: TSH: 2.15 u[IU]/mL (ref 0.35–5.50)

## 2022-01-05 LAB — HEMOGLOBIN A1C: Hgb A1c MFr Bld: 5 % (ref 4.6–6.5)

## 2022-01-05 LAB — VITAMIN B12: Vitamin B-12: 1209 pg/mL — ABNORMAL HIGH (ref 211–911)

## 2022-01-05 LAB — VITAMIN D 25 HYDROXY (VIT D DEFICIENCY, FRACTURES): VITD: 57.12 ng/mL (ref 30.00–100.00)

## 2022-01-05 NOTE — Patient Instructions (Signed)
-  Nice seeing you today!!  -Lab work today; will notify you once results are available.  -See you back in 6 months or sooner as needed. 

## 2022-01-05 NOTE — Progress Notes (Signed)
Established Patient Office Visit     This visit occurred during the SARS-CoV-2 public health emergency.  Safety protocols were in place, including screening questions prior to the visit, additional usage of staff PPE, and extensive cleaning of exam room while observing appropriate contact time as indicated for disinfecting solutions.    CC/Reason for Visit: Annual preventive exam and subsequent Medicare wellness visit  HPI: Antonio Newton is a 76 y.o. male who is coming in today for the above mentioned reasons. Past Medical History is significant for: Hypertension, GERD, lumbar spinal stenosis on gabapentin.  He has been doing well since we last spoke and has no acute complaints.  Last year he had been having lots of issues with his reflux but is much better today.  He is wondering about titrating his pantoprazole dosing.  His colonoscopy was in 2016.  All immunizations are up-to-date and age-appropriate.  He has routine eye and dental care.  He exercises by walking on a daily basis.   Past Medical/Surgical History: Past Medical History:  Diagnosis Date   Allergy    Anxiety    ED (erectile dysfunction)    Hypertension    Incarcerated right inguinal hernia 12/23/2016   Neuromuscular disorder (HCC)    neuropathy - mildly in bil hands-is improved since neck surgery   OA (osteoarthritis)    left hip   Pneumonia YRS AGO   Prostate cancer ([place])    Spinal stenosis     Past Surgical History:  Procedure Laterality Date   ANTERIOR CERVICAL DECOMP/DISCECTOMY FUSION N/A 02/06/2016   Procedure: Cevical three-four, Cervical four-five, Cervical five-six anterior cervical decompression with fusion interbody prosthesis plating and bonegraft;  Surgeon: Consuella Lose, MD;  Location: [facility];  Service: Neurosurgery;  Laterality: N/A;   COLONOSCOPY     CYSTOSCOPY N/A 03/17/2020   Procedure: CYSTOSCOPY;  Surgeon: Raynelle Bring, MD;  Location: [facility];  Service:  Urology;  Laterality: N/A;   INGUINAL HERNIA REPAIR Right 12/23/2016   Procedure: OPEN REPAIR RIGHT INGUINAL HERNIA WITH MESH;  Surgeon: Fanny Skates, MD;  Location: [facility];  Service: General;  Laterality: Right;   INSERTION OF MESH Right 12/23/2016   Procedure: INSERTION OF MESH;  Surgeon: Fanny Skates, MD;  Location: [facility];  Service: General;  Laterality: Right;   None     postate biopsy     RADIOACTIVE SEED IMPLANT N/A 03/17/2020   Procedure: RADIOACTIVE SEED IMPLANT/BRACHYTHERAPY IMPLANT;  Surgeon: Raynelle Bring, MD;  Location: [facility];  Service: Urology;  Laterality: N/A;   SPACE OAR INSTILLATION N/A 03/17/2020   Procedure: SPACE OAR INSTILLATION;  Surgeon: Raynelle Bring, MD;  Location: [facility];  Service: Urology;  Laterality: N/A;   steroid injection to back  03/11/2020    Social History:  reports that he has never smoked. He has never used smokeless tobacco. He reports current alcohol use. He reports that he does not use drugs.  Allergies: No Known Allergies  Family History:  Family History  Problem Relation Age of Onset   Hypertension Father        Deceased, 41   Prostate cancer Father    Hypertension Mother        Deceased, 44   Renal cancer Mother        mets to lung   Colon cancer Mother    Healthy Sister    Esophageal cancer Paternal Grandfather    Rectal cancer Neg Hx    Stomach  cancer Neg Hx    Breast cancer Neg Hx    Pancreatic cancer Neg Hx      Current Outpatient Medications:    amLODipine (NORVASC) 10 MG tablet, TAKE 1 TABLET BY MOUTH  DAILY, Disp: 90 tablet, Rfl: 1   Cyanocobalamin (B-12 PO), Take by mouth daily., Disp: , Rfl:    gabapentin (NEURONTIN) 300 MG capsule, TAKE 1 CAPSULE BY MOUTH  TWICE DAILY, Disp: 180 capsule, Rfl: 3   Ginger, Zingiber officinalis, (GINGER PO), Take 500 mg by mouth daily at 12 noon., Disp: , Rfl:    Glycerin-Hypromellose-PEG 400 0.2-0.36-1 % SOLN, Place 1-2 drops into both eyes 3  (three) times daily as needed (for dry/tired eyes.)., Disp: , Rfl:    hydrochlorothiazide (HYDRODIURIL) 25 MG tablet, TAKE 1 TABLET BY MOUTH  DAILY, Disp: 90 tablet, Rfl: 3   levocetirizine (XYZAL) 5 MG tablet, TAKE 1 TABLET BY MOUTH IN  THE EVENING, Disp: 90 tablet, Rfl: 1   losartan (COZAAR) 100 MG tablet, TAKE 1 TABLET BY MOUTH  DAILY, Disp: 90 tablet, Rfl: 3   Magnesium 500 MG TABS, Take by mouth. Take 1 tablet daily and at bedtime three nights per week, Disp: , Rfl:    Multiple Vitamins-Minerals (MULTIVITAMIN ADULT PO), Take by mouth., Disp: , Rfl:    pantoprazole (PROTONIX) 40 MG tablet, Take 1 tablet (40 mg total) by mouth daily., Disp: 90 tablet, Rfl: 0   potassium chloride SA (KLOR-CON) 20 MEQ tablet, TAKE 1 TABLET BY MOUTH  DAILY, Disp: 90 tablet, Rfl: 1   sildenafil (VIAGRA) 100 MG tablet, Take 1 tablet (100 mg total) by mouth daily as needed., Disp: 10 tablet, Rfl: 11   Turmeric (QC TUMERIC COMPLEX PO), Take by mouth daily., Disp: , Rfl:   Review of Systems:  Constitutional: Denies fever, chills, diaphoresis, appetite change and fatigue.  HEENT: Denies photophobia, eye pain, redness, hearing loss, ear pain, congestion, sore throat, rhinorrhea, sneezing, mouth sores, trouble swallowing, neck pain, neck stiffness and tinnitus.   Respiratory: Denies SOB, DOE, cough, chest tightness,  and wheezing.   Cardiovascular: Denies chest pain, palpitations and leg swelling.  Gastrointestinal: Denies nausea, vomiting, abdominal pain, diarrhea, constipation, blood in stool and abdominal distention.  Genitourinary: Denies dysuria, urgency, frequency, hematuria, flank pain and difficulty urinating.  Endocrine: Denies: hot or cold intolerance, sweats, changes in hair or nails, polyuria, polydipsia. Musculoskeletal: Denies myalgias, back pain, joint swelling, arthralgias and gait problem.  Skin: Denies pallor, rash and wound.  Neurological: Denies dizziness, seizures, syncope, weakness,  light-headedness, numbness and headaches.  Hematological: Denies adenopathy. Easy bruising, personal or family bleeding history  Psychiatric/Behavioral: Denies suicidal ideation, mood changes, confusion, nervousness, sleep disturbance and agitation    Physical Exam: Vitals:   01/05/22 0815  BP: 130/80  Pulse: 77  Temp: 98.2 F (36.8 C)  TempSrc: Oral  SpO2: 99%  Weight: 193 lb 1.6 oz (87.6 kg)  Height: 5' 10.5" (1.791 m)    Body mass index is 27.32 kg/m.   Constitutional: NAD, calm, comfortable Eyes: PERRL, lids and conjunctivae normal, wears corrective lenses ENMT: Mucous membranes are moist. Posterior pharynx is erythematous but clear of any exudate or lesions. Normal dentition. Tympanic membrane is pearly white, no erythema or bulging. Neck: normal, supple, no masses, no thyromegaly Respiratory: clear to auscultation bilaterally, no wheezing, no crackles. Normal respiratory effort. No accessory muscle use.  Cardiovascular: Regular rate and rhythm, no murmurs / rubs / gallops. No extremity edema. 2+ pedal pulses. No carotid bruits.  Abdomen: no  tenderness, no masses palpated. No hepatosplenomegaly. Bowel sounds positive.  Musculoskeletal: no clubbing / cyanosis. No joint deformity upper and lower extremities. Good ROM, no contractures. Normal muscle tone.  Skin: no rashes, lesions, ulcers. No induration Neurologic: CN 2-12 grossly intact. Sensation intact, DTR normal. Strength 5/5 in all 4.  Psychiatric: Normal judgment and insight. Alert and oriented x 3. Normal mood.    Subsequent Medicare wellness visit   1. Risk factors, based on past  M,S,F -cardiovascular disease risk factors include age, gender, history of hypertension   2.  Physical activities: Walks daily   3.  Depression/mood: Stable, not depressed   4.  Hearing: No perceived issues   5.  ADL's: Independent in all ADLs   6.  Fall risk: Low fall risk   7.  Home safety: No problems identified   8.  Height  weight, and visual acuity: height and weight as above, vision:  Vision Screening   Right eye Left eye Both eyes  Without correction     With correction 20/10 20/15      9.  Counseling: Advised to strive for 25 to 30 minutes of physical activity on a daily basis   10. Lab orders based on risk factors: Laboratory update will be reviewed   11. Referral : None today   12. Care plan: Follow-up with me in 6 months   13. Cognitive assessment: No cognitive impairment   14. Screening: Patient provided with a written and personalized 5-10 year screening schedule in the AVS. yes   15. Provider List Update: PCP, urologist, ophthalmologist  16. Advance Directives: Full code   17. Opioids: Patient is not on any opioid prescriptions and has no risk factors for a substance use disorder.   [place] Office Visit from 12/31/2020 in [place] at [place]  PHQ-9 Total Score 0       Fall Risk 03/17/2020 04/03/2020 12/31/2020 10/28/2021 01/05/2022  Falls in the past year? - - 0 0 0  Was there an injury with Fall? - - 0 - -  Fall Risk Category Calculator - - 0 - -  Fall Risk Category - - Low - -  Patient Fall Risk Level Low fall risk Low fall risk - - -  Patient at Risk for Falls Due to - - - - -  Fall risk Follow up - - - - -     Impression and Plan:  Encounter for preventive health examination -Recommend routine eye and dental care. -Immunizations: All immunizations are up-to-date and age-appropriate. -Healthy lifestyle discussed in detail. -Labs to be updated today. -Colon cancer screening: 2016, 10-year callback -Breast cancer screening: Not applicable -Cervical cancer screening: Not applicable -Lung cancer screening: Not applicable -Prostate cancer screening: Follows with urology, he has a history of prostate cancer -DEXA: Not applicable  Hyperlipidemia, unspecified hyperlipidemia type -Check lipids today.  Gastroesophageal reflux disease without esophagitis -We  discussed weaning off pantoprazole by starting to take it every other day for now.  Essential hypertension -Fair control on current regimen.  Malignant neoplasm of prostate ([place]) -Follows with urologist by annually, they monitor his PSA.    Patient Instructions  -Nice seeing you today!!  -Lab work today; will notify you once results are available.  -See you back in 6 months or sooner as needed.      Lelon Frohlich, MD [facility] at [facility]

## 2022-01-14 ENCOUNTER — Other Ambulatory Visit: Payer: Self-pay | Admitting: Internal Medicine

## 2022-01-15 NOTE — Telephone Encounter (Signed)
Pt is calling and he is taking amlodipine and potassium

## 2022-01-15 NOTE — Telephone Encounter (Signed)
LVM instructions to call office to verify if taking medication.

## 2022-01-20 ENCOUNTER — Encounter: Payer: Self-pay | Admitting: Internal Medicine

## 2022-01-20 NOTE — Telephone Encounter (Addendum)
Pt states congestion noted despite taking medication, admits to not taking it as prescribed all the time. Denies SOB, fever, endorses an intermittent dry cough. Pt advised to continue Mucinex DM as instructed & increase fluid intake. Pt verb understanding.  Pt also reassured that he is doing appropriate things to help with cholesterol & to continue the good work. Verified that pt has appt in July for f/u with PCP & lab work.

## 2022-01-21 ENCOUNTER — Encounter: Payer: Self-pay | Admitting: Internal Medicine

## 2022-01-21 NOTE — Telephone Encounter (Signed)
Pt states he's been taking Potassium daily for approx 3-4 years. Medication refilled to [facility].

## 2022-02-03 LAB — PSA: PSA: 0.43

## 2022-02-10 DIAGNOSIS — R3912 Poor urinary stream: Secondary | ICD-10-CM | POA: Diagnosis not present

## 2022-02-14 ENCOUNTER — Other Ambulatory Visit: Payer: Self-pay | Admitting: Internal Medicine

## 2022-02-14 DIAGNOSIS — I10 Essential (primary) hypertension: Secondary | ICD-10-CM

## 2022-02-24 ENCOUNTER — Encounter: Payer: Self-pay | Admitting: Internal Medicine

## 2022-03-03 ENCOUNTER — Other Ambulatory Visit: Payer: Self-pay | Admitting: Family Medicine

## 2022-03-17 ENCOUNTER — Other Ambulatory Visit: Payer: Self-pay | Admitting: Internal Medicine

## 2022-03-17 DIAGNOSIS — I10 Essential (primary) hypertension: Secondary | ICD-10-CM

## 2022-03-19 DIAGNOSIS — H524 Presbyopia: Secondary | ICD-10-CM | POA: Diagnosis not present

## 2022-03-19 DIAGNOSIS — H2513 Age-related nuclear cataract, bilateral: Secondary | ICD-10-CM | POA: Diagnosis not present

## 2022-04-30 ENCOUNTER — Other Ambulatory Visit: Payer: Self-pay | Admitting: Internal Medicine

## 2022-05-25 ENCOUNTER — Other Ambulatory Visit: Payer: Self-pay | Admitting: Internal Medicine

## 2022-05-25 DIAGNOSIS — I10 Essential (primary) hypertension: Secondary | ICD-10-CM

## 2022-07-01 ENCOUNTER — Other Ambulatory Visit: Payer: Self-pay | Admitting: Internal Medicine

## 2022-07-06 ENCOUNTER — Ambulatory Visit (INDEPENDENT_AMBULATORY_CARE_PROVIDER_SITE_OTHER): Payer: Medicare Other | Admitting: Internal Medicine

## 2022-07-06 ENCOUNTER — Encounter: Payer: Self-pay | Admitting: Internal Medicine

## 2022-07-06 VITALS — BP 130/80 | HR 70 | Temp 98.8°F | Wt 191.9 lb

## 2022-07-06 DIAGNOSIS — M48061 Spinal stenosis, lumbar region without neurogenic claudication: Secondary | ICD-10-CM | POA: Diagnosis not present

## 2022-07-06 DIAGNOSIS — I10 Essential (primary) hypertension: Secondary | ICD-10-CM | POA: Diagnosis not present

## 2022-07-06 DIAGNOSIS — E785 Hyperlipidemia, unspecified: Secondary | ICD-10-CM | POA: Diagnosis not present

## 2022-07-06 LAB — LIPID PANEL
Cholesterol: 181 mg/dL (ref 0–200)
HDL: 41 mg/dL (ref >39.00)
LDL Cholesterol: 104 mg/dL — ABNORMAL HIGH (ref 0–99)
NonHDL: 140.3
Total CHOL/HDL Ratio: 4
Triglycerides: 181 mg/dL — ABNORMAL HIGH (ref 0.0–149.0)
VLDL: 36.2 mg/dL (ref 0.0–40.0)

## 2022-07-06 NOTE — Progress Notes (Signed)
Established Patient Office Visit     CC/Reason for Visit: 28-monthfollow-up chronic medical conditions  HPI: Antonio HEGGSis a 76y.o. male who is coming in today for the above mentioned reasons. Past Medical History is significant for:  Hypertension, GERD, lumbar spinal stenosis on gabapentin.  He was also found to have mild hyperlipidemia during his last set of labs, not on medication.  He has been working on lifestyle changes.  He tells me that in the last 6 months he has completely weaned off PPI that he had been on for his GERD, he has not had a recurrence.  He does state that he has been getting kind of drowsy.  He is on gabapentin and has been taking it twice daily.  We talked about possibly taking 2 tablets at bedtime instead to mitigate that side effect.  He is otherwise doing well.  Past Medical/Surgical History: Past Medical History:  Diagnosis Date   Allergy    Anxiety    ED (erectile dysfunction)    Hypertension    Incarcerated right inguinal hernia 12/23/2016   Neuromuscular disorder (HCC)    neuropathy - mildly in bil hands-is improved since neck surgery   OA (osteoarthritis)    left hip   Pneumonia YRS AGO   [place]    Spinal stenosis     Past Surgical History:  Procedure Laterality Date   ANTERIOR CERVICAL DECOMP/DISCECTOMY FUSION N/A 02/06/2016   Procedure: Cevical three-four, Cervical four-five, Cervical five-six anterior cervical decompression with fusion interbody prosthesis plating and bonegraft;  Surgeon: NConsuella Lose MD;  Location: [facility];  Service: Neurosurgery;  Laterality: N/A;   COLONOSCOPY     CYSTOSCOPY N/A 03/17/2020   Procedure: CYSTOSCOPY;  Surgeon: BRaynelle Bring MD;  [facility]  Service: Urology;  Laterality: N/A;   INGUINAL HERNIA REPAIR Right 12/23/2016   Procedure: OPEN REPAIR RIGHT INGUINAL HERNIA WITH MESH;  Surgeon: HFanny Skates MD;  Location: [facility];  Service: General;   Laterality: Right;   INSERTION OF MESH Right 12/23/2016   Procedure: INSERTION OF MESH;  Surgeon: HFanny Skates MD;  Location: [facility];  Service: General;  Laterality: Right;   None     postate biopsy     RADIOACTIVE SEED IMPLANT N/A 03/17/2020   Procedure: RADIOACTIVE SEED IMPLANT/BRACHYTHERAPY IMPLANT;  Surgeon: BRaynelle Bring MD;  [facility]  Service: Urology;  Laterality: N/A;   SPACE OAR INSTILLATION N/A 03/17/2020   Procedure: SPACE OAR INSTILLATION;  Surgeon: BRaynelle Bring MD;  [facility]  Service: Urology;  Laterality: N/A;   steroid injection to back  03/11/2020    Social History:  reports that he has never smoked. He has never used smokeless tobacco. He reports current alcohol use. He reports that he does not use drugs.  Allergies: No Known Allergies  Family History:  Family History  Problem Relation Age of Onset   Hypertension Father        Deceased, 759  Prostate cancer Father    Hypertension Mother        Deceased, 868  Renal cancer Mother        mets to lung   Colon cancer Mother    Healthy Sister    Esophageal cancer Paternal Grandfather    Rectal cancer Neg Hx    Stomach cancer Neg Hx    Breast cancer Neg Hx    Pancreatic cancer Neg Hx  Current Outpatient Medications:    amLODipine (NORVASC) 10 MG tablet, Take 1 tablet (10 mg total) by mouth daily., Disp: 90 tablet, Rfl: 1   Cyanocobalamin (B-12 PO), Take by mouth daily., Disp: , Rfl:    gabapentin (NEURONTIN) 300 MG capsule, TAKE 1 CAPSULE BY MOUTH  TWICE DAILY, Disp: 180 capsule, Rfl: 3   Ginger, Zingiber officinalis, (GINGER PO), Take 500 mg by mouth daily at 12 noon., Disp: , Rfl:    Glycerin-Hypromellose-PEG 400 0.2-0.36-1 % SOLN, Place 1-2 drops into both eyes 3 (three) times daily as needed (for dry/tired eyes.)., Disp: , Rfl:    hydrochlorothiazide (HYDRODIURIL) 25 MG tablet, TAKE 1 TABLET BY MOUTH DAILY, Disp: 100 tablet, Rfl: 2    levocetirizine (XYZAL) 5 MG tablet, TAKE 1 TABLET BY MOUTH IN  THE EVENING, Disp: 90 tablet, Rfl: 1   losartan (COZAAR) 100 MG tablet, TAKE 1 TABLET BY MOUTH  DAILY, Disp: 90 tablet, Rfl: 3   Magnesium 500 MG TABS, Take by mouth. Take 1 tablet daily and at bedtime three nights per week, Disp: , Rfl:    Multiple Vitamins-Minerals (MULTIVITAMIN ADULT PO), Take by mouth., Disp: , Rfl:    potassium chloride SA (KLOR-CON M) 20 MEQ tablet, TAKE 1 TABLET BY MOUTH DAILY, Disp: 90 tablet, Rfl: 0   sildenafil (VIAGRA) 100 MG tablet, Take 1 tablet (100 mg total) by mouth daily as needed., Disp: 10 tablet, Rfl: 11   Turmeric (QC TUMERIC COMPLEX PO), Take by mouth daily., Disp: , Rfl:   Review of Systems:  Constitutional: Denies fever, chills, diaphoresis, appetite change and fatigue.  HEENT: Denies photophobia, eye pain, redness, hearing loss, ear pain, congestion, sore throat, rhinorrhea, sneezing, mouth sores, trouble swallowing, neck pain, neck stiffness and tinnitus.   Respiratory: Denies SOB, DOE, cough, chest tightness,  and wheezing.   Cardiovascular: Denies chest pain, palpitations and leg swelling.  Gastrointestinal: Denies nausea, vomiting, abdominal pain, diarrhea, constipation, blood in stool and abdominal distention.  Genitourinary: Denies dysuria, urgency, frequency, hematuria, flank pain and difficulty urinating.  Endocrine: Denies: hot or cold intolerance, sweats, changes in hair or nails, polyuria, polydipsia. Musculoskeletal: Denies myalgias, back pain, joint swelling, arthralgias and gait problem.  Skin: Denies pallor, rash and wound.  Neurological: Denies dizziness, seizures, syncope, weakness, light-headedness, numbness and headaches.  Hematological: Denies adenopathy. Easy bruising, personal or family bleeding history  Psychiatric/Behavioral: Denies suicidal ideation, mood changes, confusion, nervousness, sleep disturbance and agitation    Physical Exam: Vitals:   07/06/22 0828   BP: 130/80  Pulse: 70  Temp: 98.8 F (37.1 C)  TempSrc: Oral  SpO2: 98%  Weight: 191 lb 14.4 oz (87 kg)    Body mass index is 27.15 kg/m.   Constitutional: NAD, calm, comfortable Eyes: PERRL, lids and conjunctivae normal ENMT: Mucous membranes are moist.  Respiratory: clear to auscultation bilaterally, no wheezing, no crackles. Normal respiratory effort. No accessory muscle use.  Cardiovascular: Regular rate and rhythm, no murmurs / rubs / gallops. No extremity edema.  Psychiatric: Normal judgment and insight. Alert and oriented x 3. Normal mood.    Impression and Plan:  Essential hypertension -Well-controlled on amlodipine and losartan.  Hyperlipidemia, unspecified hyperlipidemia type  - Plan: Lipid panel -Not on medication  Spinal stenosis of lumbar region, unspecified whether neurogenic claudication present -He will take gabapentin 2 tablets at bedtime instead of 1 tab twice daily to see if that helps with drowsiness.    Time spent:34 minutes reviewing chart, interviewing and examining patient and formulating plan of  care.    Lelon Frohlich, MD [facility] at [facility]

## 2022-09-04 ENCOUNTER — Other Ambulatory Visit: Payer: Self-pay | Admitting: Internal Medicine

## 2022-09-11 ENCOUNTER — Other Ambulatory Visit: Payer: Self-pay | Admitting: Internal Medicine

## 2022-09-17 ENCOUNTER — Ambulatory Visit (INDEPENDENT_AMBULATORY_CARE_PROVIDER_SITE_OTHER): Payer: Medicare Other

## 2022-09-17 ENCOUNTER — Encounter: Payer: Self-pay | Admitting: Internal Medicine

## 2022-09-17 DIAGNOSIS — Z23 Encounter for immunization: Secondary | ICD-10-CM

## 2022-09-17 NOTE — Progress Notes (Signed)
pt is here for High Dose Flu vaccine. pt received vaccine in left deltoid at 09:27am. Given by Marcy Salvo . Pt tolerated vaccine well.

## 2022-10-19 ENCOUNTER — Encounter: Payer: Self-pay | Admitting: Internal Medicine

## 2022-10-25 ENCOUNTER — Other Ambulatory Visit: Payer: Self-pay | Admitting: Internal Medicine

## 2022-10-26 ENCOUNTER — Other Ambulatory Visit: Payer: Self-pay | Admitting: Internal Medicine

## 2022-10-27 ENCOUNTER — Encounter: Payer: Self-pay | Admitting: Internal Medicine

## 2022-12-07 ENCOUNTER — Other Ambulatory Visit: Payer: Self-pay | Admitting: Internal Medicine

## 2022-12-07 DIAGNOSIS — I10 Essential (primary) hypertension: Secondary | ICD-10-CM

## 2023-01-06 ENCOUNTER — Ambulatory Visit (INDEPENDENT_AMBULATORY_CARE_PROVIDER_SITE_OTHER): Payer: Medicare Other | Admitting: Internal Medicine

## 2023-01-06 ENCOUNTER — Encounter: Payer: Self-pay | Admitting: Internal Medicine

## 2023-01-06 VITALS — BP 130/80 | HR 70 | Temp 98.0°F | Ht 70.25 in | Wt 190.2 lb

## 2023-01-06 DIAGNOSIS — I10 Essential (primary) hypertension: Secondary | ICD-10-CM | POA: Diagnosis not present

## 2023-01-06 DIAGNOSIS — Z0000 Encounter for general adult medical examination without abnormal findings: Secondary | ICD-10-CM

## 2023-01-06 DIAGNOSIS — E782 Mixed hyperlipidemia: Secondary | ICD-10-CM | POA: Diagnosis not present

## 2023-01-06 DIAGNOSIS — K219 Gastro-esophageal reflux disease without esophagitis: Secondary | ICD-10-CM | POA: Diagnosis not present

## 2023-01-06 DIAGNOSIS — J301 Allergic rhinitis due to pollen: Secondary | ICD-10-CM | POA: Diagnosis not present

## 2023-01-06 DIAGNOSIS — C61 Malignant neoplasm of prostate: Secondary | ICD-10-CM

## 2023-01-06 LAB — CBC WITH DIFFERENTIAL/PLATELET
Basophils Absolute: 0 10*3/uL (ref 0.0–0.1)
Basophils Relative: 0.5 % (ref 0.0–3.0)
Eosinophils Absolute: 0.3 10*3/uL (ref 0.0–0.7)
Eosinophils Relative: 5.4 % — ABNORMAL HIGH (ref 0.0–5.0)
HCT: 42.6 % (ref 39.0–52.0)
Hemoglobin: 14.8 g/dL (ref 13.0–17.0)
Lymphocytes Relative: 15 % (ref 12.0–46.0)
Lymphs Abs: 0.8 10*3/uL (ref 0.7–4.0)
MCHC: 34.8 g/dL (ref 30.0–36.0)
MCV: 87.4 fl (ref 78.0–100.0)
Monocytes Absolute: 0.4 10*3/uL (ref 0.1–1.0)
Monocytes Relative: 7.8 % (ref 3.0–12.0)
Neutro Abs: 3.7 10*3/uL (ref 1.4–7.7)
Neutrophils Relative %: 71.3 % (ref 43.0–77.0)
Platelets: 202 10*3/uL (ref 150.0–400.0)
RBC: 4.87 Mil/uL (ref 4.22–5.81)
RDW: 13.2 % (ref 11.5–15.5)
WBC: 5.2 10*3/uL (ref 4.0–10.5)

## 2023-01-06 LAB — COMPREHENSIVE METABOLIC PANEL
ALT: 28 U/L (ref 0–53)
AST: 25 U/L (ref 0–37)
Albumin: 4.6 g/dL (ref 3.5–5.2)
Alkaline Phosphatase: 61 U/L (ref 39–117)
BUN: 16 mg/dL (ref 6–23)
CO2: 33 mEq/L — ABNORMAL HIGH (ref 19–32)
Calcium: 9.4 mg/dL (ref 8.4–10.5)
Chloride: 98 mEq/L (ref 96–112)
Creatinine, Ser: 1.07 mg/dL (ref 0.40–1.50)
GFR: 67.62 mL/min (ref >60.00)
Glucose, Bld: 109 mg/dL — ABNORMAL HIGH (ref 70–99)
Potassium: 3.6 mEq/L (ref 3.5–5.1)
Sodium: 139 mEq/L (ref 135–145)
Total Bilirubin: 0.9 mg/dL (ref 0.2–1.2)
Total Protein: 7.8 g/dL (ref 6.0–8.3)

## 2023-01-06 LAB — LIPID PANEL
Cholesterol: 173 mg/dL (ref 0–200)
HDL: 48.4 mg/dL (ref >39.00)
LDL Cholesterol: 94 mg/dL (ref 0–99)
NonHDL: 124.69
Total CHOL/HDL Ratio: 4
Triglycerides: 151 mg/dL — ABNORMAL HIGH (ref 0.0–149.0)
VLDL: 30.2 mg/dL (ref 0.0–40.0)

## 2023-01-06 LAB — TSH: TSH: 2.76 u[IU]/mL (ref 0.35–5.50)

## 2023-01-06 LAB — VITAMIN B12: Vitamin B-12: 512 pg/mL (ref 211–911)

## 2023-01-06 LAB — VITAMIN D 25 HYDROXY (VIT D DEFICIENCY, FRACTURES): VITD: 52.58 ng/mL (ref 30.00–100.00)

## 2023-01-06 LAB — HEMOGLOBIN A1C: Hgb A1c MFr Bld: 4.9 % (ref 4.6–6.5)

## 2023-01-06 MED ORDER — LEVOCETIRIZINE DIHYDROCHLORIDE 5 MG PO TABS: 5.0000 mg | ORAL_TABLET | Freq: Every evening | ORAL | 1 refills | Status: DC

## 2023-01-06 NOTE — Patient Instructions (Signed)
-  Nice seeing you today!!  -Lab work today; will notify you once results are available.  -Remember your tdap (tetanus vaccine).  -Cerave lotion daily.  -Start levocetirizine daily.  -Schedule follow up in 6 months.

## 2023-01-06 NOTE — Progress Notes (Signed)
Established Patient Office Visit     CC/Reason for Visit: Annual preventive exam and subsequent Medicare wellness visit  HPI: Antonio Newton is a 77 y.o. male who is coming in today for the above mentioned reasons. Past Medical History is significant for: Hypertension, hyperlipidemia, GERD, lumbar spinal stenosis, history of prostate cancer.  He has no major complaints today he has been feeling well.  He has routine eye and dental care, no hearing difficulty, he is due to update his Tdap.  He had a colonoscopy in 2016.   Past Medical/Surgical History: Past Medical History:  Diagnosis Date   Allergy    Anxiety    ED (erectile dysfunction)    Hypertension    Incarcerated right inguinal hernia 12/23/2016   Neuromuscular disorder (HCC)    neuropathy - mildly in bil hands-is improved since neck surgery   OA (osteoarthritis)    left hip   Pneumonia YRS AGO   Prostate cancer ([place])    Spinal stenosis     Past Surgical History:  Procedure Laterality Date   ANTERIOR CERVICAL DECOMP/DISCECTOMY FUSION N/A 02/06/2016   Procedure: Cevical three-four, Cervical four-five, Cervical five-six anterior cervical decompression with fusion interbody prosthesis plating and bonegraft;  Surgeon: Consuella Lose, MD;  Location: [facility];  Service: Neurosurgery;  Laterality: N/A;   COLONOSCOPY     CYSTOSCOPY N/A 03/17/2020   Procedure: CYSTOSCOPY;  Surgeon: Raynelle Bring, MD;  Location: [facility];  Service: Urology;  Laterality: N/A;   INGUINAL HERNIA REPAIR Right 12/23/2016   Procedure: OPEN REPAIR RIGHT INGUINAL HERNIA WITH MESH;  Surgeon: Fanny Skates, MD;  Location: [facility];  Service: General;  Laterality: Right;   INSERTION OF MESH Right 12/23/2016   Procedure: INSERTION OF MESH;  Surgeon: Fanny Skates, MD;  Location: [facility];  Service: General;  Laterality: Right;   None     postate biopsy     RADIOACTIVE SEED IMPLANT N/A 03/17/2020   Procedure: RADIOACTIVE SEED  IMPLANT/BRACHYTHERAPY IMPLANT;  Surgeon: Raynelle Bring, MD;  Location: [facility];  Service: Urology;  Laterality: N/A;   SPACE OAR INSTILLATION N/A 03/17/2020   Procedure: SPACE OAR INSTILLATION;  Surgeon: Raynelle Bring, MD;  Location: [facility];  Service: Urology;  Laterality: N/A;   steroid injection to back  03/11/2020    Social History:  reports that he has never smoked. He has never used smokeless tobacco. He reports current alcohol use. He reports that he does not use drugs.  Allergies: No Known Allergies  Family History:  Family History  Problem Relation Age of Onset   Hypertension Father        Deceased, 56   Prostate cancer Father    Hypertension Mother        Deceased, 11   Renal cancer Mother        mets to lung   Colon cancer Mother    Healthy Sister    Esophageal cancer Paternal Grandfather    Rectal cancer Neg Hx    Stomach cancer Neg Hx    Breast cancer Neg Hx    Pancreatic cancer Neg Hx      Current Outpatient Medications:    amLODipine (NORVASC) 10 MG tablet, TAKE 1 TABLET BY MOUTH DAILY, Disp: 100 tablet, Rfl: 2   gabapentin (NEURONTIN) 300 MG capsule, TAKE 1 CAPSULE BY MOUTH TWICE  DAILY (Patient taking differently: TAKE 2 Capsules BY MOUTH  at bedtime), Disp: 200 capsule, Rfl: 2   Ginger,  Zingiber officinalis, (GINGER PO), Take 500 mg by mouth daily at 12 noon., Disp: , Rfl:    Glycerin-Hypromellose-PEG 400 0.2-0.36-1 % SOLN, Place 1-2 drops into both eyes 3 (three) times daily as needed (for dry/tired eyes.)., Disp: , Rfl:    hydrochlorothiazide (HYDRODIURIL) 25 MG tablet, TAKE 1 TABLET BY MOUTH DAILY, Disp: 100 tablet, Rfl: 2   losartan (COZAAR) 100 MG tablet, TAKE 1 TABLET BY MOUTH DAILY, Disp: 100 tablet, Rfl: 1   Magnesium 500 MG TABS, Take by mouth. Take 1 tablet daily and at bedtime three nights per week, Disp: , Rfl:    Multiple Vitamins-Minerals (MULTIVITAMIN ADULT PO), Take by mouth., Disp: , Rfl:    potassium  chloride SA (KLOR-CON M) 20 MEQ tablet, TAKE 1 TABLET BY MOUTH DAILY, Disp: 90 tablet, Rfl: 3   sildenafil (VIAGRA) 100 MG tablet, Take 1 tablet (100 mg total) by mouth daily as needed., Disp: 10 tablet, Rfl: 11   Turmeric (QC TUMERIC COMPLEX PO), Take by mouth daily., Disp: , Rfl:    levocetirizine (XYZAL) 5 MG tablet, Take 1 tablet (5 mg total) by mouth every evening., Disp: 90 tablet, Rfl: 1  Review of Systems:  Constitutional: Denies fever, chills, diaphoresis, appetite change and fatigue.  HEENT: Denies photophobia, eye pain, redness, hearing loss, ear pain, congestion, sore throat, rhinorrhea, sneezing, mouth sores, trouble swallowing, neck pain, neck stiffness and tinnitus.   Respiratory: Denies SOB, DOE, cough, chest tightness,  and wheezing.   Cardiovascular: Denies chest pain, palpitations and leg swelling.  Gastrointestinal: Denies nausea, vomiting, abdominal pain, diarrhea, constipation, blood in stool and abdominal distention.  Genitourinary: Denies dysuria, urgency, frequency, hematuria, flank pain and difficulty urinating.  Endocrine: Denies: hot or cold intolerance, sweats, changes in hair or nails, polyuria, polydipsia. Musculoskeletal: Denies myalgias, back pain, joint swelling, arthralgias and gait problem.  Skin: Denies pallor, rash and wound.  Neurological: Denies dizziness, seizures, syncope, weakness, light-headedness, numbness and headaches.  Hematological: Denies adenopathy. Easy bruising, personal or family bleeding history  Psychiatric/Behavioral: Denies suicidal ideation, mood changes, confusion, nervousness, sleep disturbance and agitation    Physical Exam: Vitals:   01/06/23 0753  BP: 130/80  Pulse: 70  Temp: 98 F (36.7 C)  TempSrc: Oral  SpO2: 98%  Weight: 190 lb 3.2 oz (86.3 kg)  Height: 5' 10.25" (1.784 m)    Body mass index is 27.1 kg/m.   Constitutional: NAD, calm, comfortable Eyes: PERRL, lids and conjunctivae normal, wears corrective  lenses ENMT: Mucous membranes are moist. Posterior pharynx clear of any exudate or lesions. Normal dentition. Tympanic membrane is pearly white, no erythema or bulging. Neck: normal, supple, no masses, no thyromegaly Respiratory: clear to auscultation bilaterally, no wheezing, no crackles. Normal respiratory effort. No accessory muscle use.  Cardiovascular: Regular rate and rhythm, no murmurs / rubs / gallops. No extremity edema. 2+ pedal pulses. No carotid bruits.  Abdomen: no tenderness, no masses palpated. No hepatosplenomegaly. Bowel sounds positive.  Musculoskeletal: no clubbing / cyanosis. No joint deformity upper and lower extremities. Good ROM, no contractures. Normal muscle tone.  Skin: no rashes, lesions, ulcers. No induration Neurologic: CN 2-12 grossly intact. Sensation intact, DTR normal. Strength 5/5 in all 4.  Psychiatric: Normal judgment and insight. Alert and oriented x 3. Normal mood.   Subsequent Medicare wellness visit   1. Risk factors, based on past  M,S,F -cardiovascular disease risk factors include age, gender, history of hypertension and hyperlipidemia   2.  Physical activities: Walks daily   3.  Depression/mood: Stable,  not depressed   4.  Hearing: No perceived issues   5.  ADL's: Independent in all ADLs   6.  Fall risk: Low fall risk   7.  Home safety: No problems identified   8.  Height weight, and visual acuity: height and weight as above, vision:  Vision Screening   Right eye Left eye Both eyes  Without correction     With correction '20/20 20/25 20/20 '$     9.  Counseling: Advised to update all age-appropriate immunizations   10. Lab orders based on risk factors: Laboratory update will be reviewed   11. Referral : None today   12. Care plan: Follow-up with me in 6 months   13. Cognitive assessment: No cognitive impairment   14. Screening: Patient provided with a written and personalized 5-10 year screening schedule in the AVS. yes   15.  Provider List Update: PCP only  16. Advance Directives: Full code   17. Opioids: Patient is not on any opioid prescriptions and has no risk factors for a substance use disorder.   [place] Office Visit from 01/06/2023 in [place] at [place]  PHQ-9 Total Score 0          04/03/2020   11:56 AM 12/31/2020    7:43 AM 10/28/2021   12:39 PM 01/05/2022    8:17 AM 01/05/2023    4:34 PM  Fall Risk  Falls in the past year?  0 0 0 0  Was there an injury with Fall?  0   0  Fall Risk Category Calculator  0   0  Fall Risk Category  Low   Low  Patient Fall Risk Level Low fall risk    Low fall risk  Patient at Risk for Falls Due to     No Fall Risks  Fall risk Follow up     Falls evaluation completed      Impression and Plan:  Encounter for preventive health examination - Plan: Vitamin B12, VITAMIN D 25 Hydroxy (Vit-D Deficiency, Fractures)  Essential hypertension - Plan: CBC with Differential/Platelet, Comprehensive metabolic panel, Hemoglobin A1c, TSH  Mixed hyperlipidemia - Plan: Lipid panel  Seasonal allergic rhinitis due to pollen - Plan: levocetirizine (XYZAL) 5 MG tablet  Malignant neoplasm of prostate ([place])  Gastroesophageal reflux disease without esophagitis  -Recommend routine eye and dental care. -Immunizations: Tdap due for update, will obtain at pharmacy, all other immunizations are up-to-date -Healthy lifestyle discussed in detail. -Labs to be updated today. -Colon cancer screening: 12/2014, 10-year callback -Breast cancer screening: Not applicable -Cervical cancer screening: Not applicable -Lung cancer screening: Not applicable -Prostate cancer screening: History of prostate cancer, follows yearly with urology, PSA in 09/2022 was 0.2 -DEXA: Not applicable      Patient Instructions  -Nice seeing you today!!  -Lab work today; will notify you once results are available.  -Remember your tdap (tetanus vaccine).  -Cerave lotion daily.  -Start  levocetirizine daily.  -Schedule follow up in 6 months.    Lelon Frohlich, MD [facility] at [facility]

## 2023-01-10 ENCOUNTER — Encounter: Payer: Self-pay | Admitting: Internal Medicine

## 2023-03-09 ENCOUNTER — Other Ambulatory Visit: Payer: Self-pay | Admitting: Internal Medicine

## 2023-03-09 DIAGNOSIS — I10 Essential (primary) hypertension: Secondary | ICD-10-CM

## 2023-03-29 DIAGNOSIS — H40053 Ocular hypertension, bilateral: Secondary | ICD-10-CM | POA: Diagnosis not present

## 2023-03-29 DIAGNOSIS — H524 Presbyopia: Secondary | ICD-10-CM | POA: Diagnosis not present

## 2023-04-08 ENCOUNTER — Other Ambulatory Visit: Payer: Self-pay | Admitting: Internal Medicine

## 2023-04-08 DIAGNOSIS — J301 Allergic rhinitis due to pollen: Secondary | ICD-10-CM

## 2023-06-22 DIAGNOSIS — R3912 Poor urinary stream: Secondary | ICD-10-CM | POA: Diagnosis not present

## 2023-07-03 ENCOUNTER — Other Ambulatory Visit: Payer: Self-pay | Admitting: Internal Medicine

## 2023-07-03 DIAGNOSIS — I10 Essential (primary) hypertension: Secondary | ICD-10-CM

## 2023-07-07 ENCOUNTER — Ambulatory Visit (INDEPENDENT_AMBULATORY_CARE_PROVIDER_SITE_OTHER): Payer: Medicare Other | Admitting: Internal Medicine

## 2023-07-07 ENCOUNTER — Encounter: Payer: Self-pay | Admitting: Internal Medicine

## 2023-07-07 VITALS — BP 136/80 | HR 64 | Temp 98.0°F | Wt 191.4 lb

## 2023-07-07 DIAGNOSIS — C61 Malignant neoplasm of prostate: Secondary | ICD-10-CM | POA: Diagnosis not present

## 2023-07-07 DIAGNOSIS — I10 Essential (primary) hypertension: Secondary | ICD-10-CM

## 2023-07-07 DIAGNOSIS — E782 Mixed hyperlipidemia: Secondary | ICD-10-CM | POA: Diagnosis not present

## 2023-07-07 NOTE — Progress Notes (Signed)
Established Patient Office Visit     CC/Reason for Visit: 21-month follow-up chronic medical conditions  HPI: Antonio Newton is a 77 y.o. male who is coming in today for the above mentioned reasons. Past Medical History is significant for: Hypertension, hyperlipidemia not on medication, GERD well-controlled not on daily PPI therapy, history of prostate cancer and history of lumbar spinal stenosis.  He has been doing well.   Past Medical/Surgical History: Past Medical History:  Diagnosis Date   Allergy    Anxiety    ED (erectile dysfunction)    Hypertension    Incarcerated right inguinal hernia 12/23/2016   Neuromuscular disorder (HCC)    neuropathy - mildly in bil hands-is improved since neck surgery   OA (osteoarthritis)    left hip   Pneumonia YRS AGO   Prostate cancer (HCC)    Spinal stenosis     Past Surgical History:  Procedure Laterality Date   ANTERIOR CERVICAL DECOMP/DISCECTOMY FUSION N/A 02/06/2016   Procedure: Cevical three-four, Cervical four-five, Cervical five-six anterior cervical decompression with fusion interbody prosthesis plating and bonegraft;  Surgeon: Lisbeth Renshaw, MD;  Location: [facility];  Service: Neurosurgery;  Laterality: N/A;   COLONOSCOPY     CYSTOSCOPY N/A 03/17/2020   Procedure: CYSTOSCOPY;  Surgeon: Heloise Purpura, MD;  Location: [facility];  Service: Urology;  Laterality: N/A;   INGUINAL HERNIA REPAIR Right 12/23/2016   Procedure: OPEN REPAIR RIGHT INGUINAL HERNIA WITH MESH;  Surgeon: Claud Kelp, MD;  Location: [facility];  Service: General;  Laterality: Right;   INSERTION OF MESH Right 12/23/2016   Procedure: INSERTION OF MESH;  Surgeon: Claud Kelp, MD;  Location: [facility];  Service: General;  Laterality: Right;   None     postate biopsy     RADIOACTIVE SEED IMPLANT N/A 03/17/2020   Procedure: RADIOACTIVE SEED IMPLANT/BRACHYTHERAPY IMPLANT;  Surgeon: Heloise Purpura, MD;  Location: [facility];   Service: Urology;  Laterality: N/A;   SPACE OAR INSTILLATION N/A 03/17/2020   Procedure: SPACE OAR INSTILLATION;  Surgeon: Heloise Purpura, MD;  Location: [facility];  Service: Urology;  Laterality: N/A;   steroid injection to back  03/11/2020    Social History:  reports that he has never smoked. He has never used smokeless tobacco. He reports current alcohol use. He reports that he does not use drugs.  Allergies: No Known Allergies  Family History:  Family History  Problem Relation Age of Onset   Hypertension Father        Deceased, 65   Prostate cancer Father    Hypertension Mother        Deceased, 68   Renal cancer Mother        mets to lung   Colon cancer Mother    Healthy Sister    Esophageal cancer Paternal Grandfather    Rectal cancer Neg Hx    Stomach cancer Neg Hx    Breast cancer Neg Hx    Pancreatic cancer Neg Hx      Current Outpatient Medications:    amLODipine (NORVASC) 10 MG tablet, TAKE 1 TABLET BY MOUTH DAILY, Disp: 100 tablet, Rfl: 2   gabapentin (NEURONTIN) 300 MG capsule, TAKE 1 CAPSULE BY MOUTH TWICE  DAILY (Patient taking differently: TAKE 2 Capsules BY MOUTH  at bedtime), Disp: 200 capsule, Rfl: 2   Ginger, Zingiber officinalis, (GINGER PO), Take 500 mg by mouth daily at 12 noon., Disp: , Rfl:    Glycerin-Hypromellose-PEG 400 0.2-0.36-1 %  SOLN, Place 1-2 drops into both eyes 3 (three) times daily as needed (for dry/tired eyes.)., Disp: , Rfl:    hydrochlorothiazide (HYDRODIURIL) 25 MG tablet, TAKE 1 TABLET BY MOUTH DAILY, Disp: 100 tablet, Rfl: 1   levocetirizine (XYZAL) 5 MG tablet, TAKE 1 TABLET BY MOUTH IN THE  EVENING, Disp: 100 tablet, Rfl: 1   losartan (COZAAR) 100 MG tablet, TAKE 1 TABLET BY MOUTH DAILY, Disp: 100 tablet, Rfl: 1   Magnesium 500 MG TABS, Take by mouth. Take 1 tablet daily and at bedtime three nights per week, Disp: , Rfl:    Multiple Vitamins-Minerals (MULTIVITAMIN ADULT PO), Take by mouth., Disp: , Rfl:     potassium chloride SA (KLOR-CON M) 20 MEQ tablet, TAKE 1 TABLET BY MOUTH DAILY, Disp: 90 tablet, Rfl: 3   sildenafil (VIAGRA) 100 MG tablet, Take 1 tablet (100 mg total) by mouth daily as needed., Disp: 10 tablet, Rfl: 11   Turmeric (QC TUMERIC COMPLEX PO), Take by mouth daily., Disp: , Rfl:   Review of Systems:  Negative unless indicated in HPI.   Physical Exam: Vitals:   07/07/23 0821 07/07/23 0827  BP: (!) 140/80 136/80  Pulse: 64   Temp: 98 F (36.7 C)   TempSrc: Oral   SpO2: 97%   Weight: 191 lb 6.4 oz (86.8 kg)     Body mass index is 27.27 kg/m.   Physical Exam Vitals reviewed.  Constitutional:      Appearance: Normal appearance.  HENT:     Head: Normocephalic and atraumatic.  Eyes:     Conjunctiva/sclera: Conjunctivae normal.     Pupils: Pupils are equal, round, and reactive to light.  Cardiovascular:     Rate and Rhythm: Normal rate and regular rhythm.  Pulmonary:     Effort: Pulmonary effort is normal.     Breath sounds: Normal breath sounds.  Skin:    General: Skin is warm and dry.  Neurological:     General: No focal deficit present.     Mental Status: He is alert and oriented to person, place, and time.  Psychiatric:        Mood and Affect: Mood normal.        Behavior: Behavior normal.        Thought Content: Thought content normal.        Judgment: Judgment normal.      Impression and Plan:  Essential hypertension Assessment & Plan: Fair control on current, no changes today.   Malignant neoplasm of prostate [facility]) Assessment & Plan: Followed by urology, most recent PSA was 0.2.   Mixed hyperlipidemia Assessment & Plan: Following without medication.  Most recent lipid panel in January with a total cholesterol of 173, LDL 94, triglycerides 151 and HDL 48.      Time spent:31 minutes reviewing chart, interviewing and examining patient and formulating plan of care.     Chaya Jan, MD [facility] at  [facility]

## 2023-07-07 NOTE — Assessment & Plan Note (Signed)
Fair control on current, no changes today.

## 2023-07-07 NOTE — Assessment & Plan Note (Signed)
Following without medication.  Most recent lipid panel in January with a total cholesterol of 173, LDL 94, triglycerides 151 and HDL 48.

## 2023-07-07 NOTE — Assessment & Plan Note (Signed)
Followed by urology, most recent PSA was 0.2.

## 2023-07-12 ENCOUNTER — Telehealth: Payer: Self-pay | Admitting: Internal Medicine

## 2023-07-12 DIAGNOSIS — R21 Rash and other nonspecific skin eruption: Secondary | ICD-10-CM

## 2023-07-12 NOTE — Telephone Encounter (Signed)
Referral placed.

## 2023-07-12 NOTE — Telephone Encounter (Signed)
Okay to place a referral?

## 2023-07-12 NOTE — Telephone Encounter (Signed)
Pt states the medication MD gave him for his rash is simply not working and he would like to request a referral to see a Dermatologist.  Pt is asking for a call back with the contact information, once referral is in place.

## 2023-07-14 DIAGNOSIS — H40053 Ocular hypertension, bilateral: Secondary | ICD-10-CM | POA: Diagnosis not present

## 2023-07-22 ENCOUNTER — Encounter: Payer: Self-pay | Admitting: Family Medicine

## 2023-07-22 ENCOUNTER — Ambulatory Visit (INDEPENDENT_AMBULATORY_CARE_PROVIDER_SITE_OTHER): Payer: Medicare Other | Admitting: Family Medicine

## 2023-07-22 VITALS — BP 122/70 | HR 75 | Temp 98.3°F | Resp 16 | Ht 70.25 in | Wt 192.4 lb

## 2023-07-22 DIAGNOSIS — L299 Pruritus, unspecified: Secondary | ICD-10-CM | POA: Diagnosis not present

## 2023-07-22 MED ORDER — FAMOTIDINE 20 MG PO TABS: 20.0000 mg | ORAL_TABLET | Freq: Two times a day (BID) | ORAL | 0 refills | Status: DC

## 2023-07-22 MED ORDER — TRIAMCINOLONE ACETONIDE 0.5 % EX OINT: 1.0000 | TOPICAL_OINTMENT | Freq: Two times a day (BID) | CUTANEOUS | 0 refills | Status: AC

## 2023-07-22 NOTE — Patient Instructions (Addendum)
A few things to remember from today's visit:  Skin pruritus - Plan: famotidine (PEPCID) 20 MG tablet, triamcinolone ointment (KENALOG) 0.5 %  Eucerin or Cetaphil during the day as needed to moisturize skin. Avoid Benadryl. Pepcid 20 mg twice daily to help with itching. Triamcinolone, small amount, on affected area twice daily for 14 days and then as needed. Keep appointment with dermatologist. Continue Xyzal 5 mg at bedtime.   Please be sure medication list is accurate. If a new problem present, please set up appointment sooner than planned today.

## 2023-07-22 NOTE — Progress Notes (Unsigned)
ACUTE VISIT Chief Complaint  Patient presents with   body itching    From waist to neck, worse at night. Has dermatology appt 8/5.   HPI: Mr.Jaydien Judie Petit Hockaday is a 77 y.o. male, who is here today complaining of HTN, OA, seasonal allergies,and hx of prostate cancer a persistent rash located between his waist and neck, which has been present since November last year. The rash has been progressively worsening. Rash This is a recurrent problem. The current episode started more than 1 month ago. The problem has been gradually worsening since onset. The affected locations include the abdomen, back and torso. The rash is characterized by dryness and itchiness. He was exposed to nothing. Pertinent negatives include no anorexia, congestion, cough, diarrhea, eye pain, facial edema, fatigue, fever, joint pain, nail changes, rhinorrhea, shortness of breath, sore throat or vomiting. Past treatments include moisturizer and antihistamine. The treatment provided mild relief. His past medical history is significant for allergies. There is no history of asthma or eczema.   He has been using Aveeno body wash and has been taking Benadryl for three to four days. He has not experienced any improvement. He has an upcoming appointment with a dermatologist, 08/01/23.  The rash is very pruritus, interferes with sleep. He has not noted any lesions in his groin, between his fingers, or between his toes.  He reports no new medications,travel, or exposures around the time the rash started.  He denies any oral lesions, eye or throat pruritus,or wheezing.  He has tried various treatments, including CeraVe itch cream, baking soda, witch hazel, and alcohol, but none have provided lasting relief. He has no history of eczema or similar rash in the past.  Review of Systems  Constitutional:  Negative for activity change, appetite change, fatigue and fever.  HENT:  Negative for congestion, rhinorrhea and sore throat.   Eyes:   Negative for pain.  Respiratory:  Negative for cough, shortness of breath and stridor.   Cardiovascular:  Negative for leg swelling.  Gastrointestinal:  Negative for anorexia, diarrhea and vomiting.  Genitourinary:  Negative for decreased urine volume and hematuria.  Musculoskeletal:  Negative for joint pain, joint swelling and myalgias.  Skin:  Positive for rash. Negative for nail changes.  Neurological:  Negative for weakness and numbness.  See other pertinent positives and negatives in HPI.  Current Outpatient Medications on File Prior to Visit  Medication Sig Dispense Refill   amLODipine (NORVASC) 10 MG tablet TAKE 1 TABLET BY MOUTH DAILY 100 tablet 2   gabapentin (NEURONTIN) 300 MG capsule TAKE 1 CAPSULE BY MOUTH TWICE  DAILY (Patient taking differently: TAKE 2 Capsules BY MOUTH  at bedtime) 200 capsule 2   Ginger, Zingiber officinalis, (GINGER PO) Take 500 mg by mouth daily at 12 noon.     Glycerin-Hypromellose-PEG 400 0.2-0.36-1 % SOLN Place 1-2 drops into both eyes 3 (three) times daily as needed (for dry/tired eyes.).     hydrochlorothiazide (HYDRODIURIL) 25 MG tablet TAKE 1 TABLET BY MOUTH DAILY 100 tablet 1   levocetirizine (XYZAL) 5 MG tablet TAKE 1 TABLET BY MOUTH IN THE  EVENING 100 tablet 1   losartan (COZAAR) 100 MG tablet TAKE 1 TABLET BY MOUTH DAILY 100 tablet 1   Magnesium 500 MG TABS Take by mouth. Take 1 tablet daily and at bedtime three nights per week     Multiple Vitamins-Minerals (MULTIVITAMIN ADULT PO) Take by mouth.     potassium chloride SA (KLOR-CON M) 20 MEQ tablet TAKE 1 TABLET  BY MOUTH DAILY 90 tablet 3   sildenafil (VIAGRA) 100 MG tablet Take 1 tablet (100 mg total) by mouth daily as needed. 10 tablet 11   Turmeric (QC TUMERIC COMPLEX PO) Take by mouth daily.     No current facility-administered medications on file prior to visit.    Past Medical History:  Diagnosis Date   Allergy    Anxiety    ED (erectile dysfunction)    Hypertension     Incarcerated right inguinal hernia 12/23/2016   Neuromuscular disorder (HCC)    neuropathy - mildly in bil hands-is improved since neck surgery   OA (osteoarthritis)    left hip   Pneumonia YRS AGO   Prostate cancer (HCC)    Spinal stenosis    No Known Allergies  Social History   Socioeconomic History   Marital status: Married    Spouse name: Not on file   Number of children: 3   Years of education: Not on file   Highest education level: Bachelor's degree (e.g., BA, AB, BS)  Occupational History   Occupation: Realtor    Comment: full time  Tobacco Use   Smoking status: Never   Smokeless tobacco: Never  Vaping Use   Vaping status: Never Used  Substance and Sexual Activity   Alcohol use: Yes    Alcohol/week: 0.0 standard drinks of alcohol    Comment: Occasionally (few times per month)   Drug use: No   Sexual activity: Yes  Other Topics Concern   Not on file  Social History Narrative   Works as a Arts development officer   Lives with wife.  They have three grown children (2 daughters and 1 son).   Social Determinants of Health   Financial Resource Strain: Low Risk  (07/04/2023)   Overall Financial Resource Strain (CARDIA)    Difficulty of Paying Living Expenses: Not hard at all  Food Insecurity: No Food Insecurity (07/04/2023)   Hunger Vital Sign    Worried About Running Out of Food in the Last Year: Never true    Ran Out of Food in the Last Year: Never true  Transportation Needs: No Transportation Needs (07/04/2023)   PRAPARE - Administrator, Civil Service (Medical): No    Lack of Transportation (Non-Medical): No  Physical Activity: Insufficiently Active (07/04/2023)   Exercise Vital Sign    Days of Exercise per Week: 4 days    Minutes of Exercise per Session: 30 min  Stress: Stress Concern Present (07/04/2023)   [employer] of Occupational Health - Occupational Stress Questionnaire    Feeling of Stress : To some extent  Social  Connections: Socially Integrated (07/04/2023)   Social Connection and Isolation Panel [NHANES]    Frequency of Communication with Friends and Family: More than three times a week    Frequency of Social Gatherings with Friends and Family: More than three times a week    Attends Religious Services: More than 4 times per year    Active Member of [employer] or Organizations: Yes    Attends Banker Meetings: More than 4 times per year    Marital Status: Married   Vitals:   07/22/23 1521  BP: 122/70  Pulse: 75  Resp: 16  Temp: 98.3 F (36.8 C)  SpO2: 99%   Body mass index is 27.41 kg/m.  Physical Exam Vitals and nursing note reviewed.  Constitutional:      General: He is not in acute distress.  Appearance: He is well-developed.  HENT:     Head: Normocephalic and atraumatic.     Mouth/Throat:     Mouth: Mucous membranes are moist.     Pharynx: Oropharynx is clear.  Eyes:     Conjunctiva/sclera: Conjunctivae normal.  Cardiovascular:     Rate and Rhythm: Normal rate and regular rhythm.     Heart sounds: No murmur heard. Pulmonary:     Effort: Pulmonary effort is normal. No respiratory distress.     Breath sounds: Normal breath sounds.  Lymphadenopathy:     Cervical: No cervical adenopathy.  Skin:    General: Skin is warm and dry.     Findings: Rash present. No erythema.     Comments: Scattered dry and erythematous confluent areas on upper back and anterior right elbow. Lower back with superficial excoriations and scratching marks. Upper extremity scratching marks.  Neurological:     General: No focal deficit present.     Mental Status: He is alert and oriented to person, place, and time.     Gait: Gait normal.  Psychiatric:        Mood and Affect: Mood and affect normal.   ASSESSMENT AND PLAN: Skin pruritus -     Famotidine; Take 1 tablet (20 mg total) by mouth 2 (two) times daily.  Dispense: 60 tablet; Refill: 0 -     Triamcinolone Acetonide; Apply 1  Application topically 2 (two) times daily.  Dispense: 45 g; Refill: 0   Since 10/2022 and getting worse. We discussed possible etiologies. History and examination do not suggest a serious process. ?  Eczema, dry skin, neurodermatitis. Recommend stopping Benadryl. Try Pepcid 20 mg twice daily for 30 days. Triamcinolone ointment twice daily, small amount, for 14 days then as needed. Resume Xyzal 5 mg daily at bedtime. Side effects of medications discussed. Keep appointment with dermatologist.  Return if symptoms worsen or fail to improve.  Alonso Gapinski G. [place], MD  [facility]. [place] office.

## 2023-07-30 ENCOUNTER — Other Ambulatory Visit: Payer: Self-pay | Admitting: Internal Medicine

## 2023-08-01 DIAGNOSIS — L503 Dermatographic urticaria: Secondary | ICD-10-CM | POA: Diagnosis not present

## 2023-08-09 ENCOUNTER — Other Ambulatory Visit: Payer: Self-pay | Admitting: Internal Medicine

## 2023-09-21 DIAGNOSIS — L139 Bullous disorder, unspecified: Secondary | ICD-10-CM | POA: Diagnosis not present

## 2023-09-21 DIAGNOSIS — L308 Other specified dermatitis: Secondary | ICD-10-CM | POA: Diagnosis not present

## 2023-09-21 DIAGNOSIS — L309 Dermatitis, unspecified: Secondary | ICD-10-CM | POA: Diagnosis not present

## 2023-09-21 DIAGNOSIS — L299 Pruritus, unspecified: Secondary | ICD-10-CM | POA: Diagnosis not present

## 2023-09-21 DIAGNOSIS — R238 Other skin changes: Secondary | ICD-10-CM | POA: Diagnosis not present

## 2023-09-21 DIAGNOSIS — D492 Neoplasm of unspecified behavior of bone, soft tissue, and skin: Secondary | ICD-10-CM | POA: Diagnosis not present

## 2023-09-29 ENCOUNTER — Encounter: Payer: Self-pay | Admitting: Internal Medicine

## 2023-09-29 ENCOUNTER — Other Ambulatory Visit: Payer: Self-pay | Admitting: Internal Medicine

## 2023-09-29 ENCOUNTER — Other Ambulatory Visit: Payer: Self-pay | Admitting: *Deleted

## 2023-09-29 DIAGNOSIS — I10 Essential (primary) hypertension: Secondary | ICD-10-CM

## 2023-10-11 DIAGNOSIS — Z79899 Other long term (current) drug therapy: Secondary | ICD-10-CM | POA: Diagnosis not present

## 2023-10-31 ENCOUNTER — Other Ambulatory Visit: Payer: Self-pay | Admitting: Internal Medicine

## 2023-11-15 DIAGNOSIS — D492 Neoplasm of unspecified behavior of bone, soft tissue, and skin: Secondary | ICD-10-CM | POA: Diagnosis not present

## 2023-11-15 DIAGNOSIS — R238 Other skin changes: Secondary | ICD-10-CM | POA: Diagnosis not present

## 2023-11-15 DIAGNOSIS — L989 Disorder of the skin and subcutaneous tissue, unspecified: Secondary | ICD-10-CM | POA: Diagnosis not present

## 2024-01-10 ENCOUNTER — Encounter: Payer: Medicare Other | Admitting: Internal Medicine

## 2024-01-12 DIAGNOSIS — L120 Bullous pemphigoid: Secondary | ICD-10-CM | POA: Diagnosis not present

## 2024-01-14 ENCOUNTER — Other Ambulatory Visit: Payer: Self-pay | Admitting: Internal Medicine

## 2024-01-14 DIAGNOSIS — I10 Essential (primary) hypertension: Secondary | ICD-10-CM

## 2024-02-01 DIAGNOSIS — R3121 Asymptomatic microscopic hematuria: Secondary | ICD-10-CM | POA: Diagnosis not present

## 2024-02-20 ENCOUNTER — Encounter: Payer: Self-pay | Admitting: Internal Medicine

## 2024-02-22 ENCOUNTER — Other Ambulatory Visit: Payer: Self-pay | Admitting: Internal Medicine

## 2024-02-23 ENCOUNTER — Ambulatory Visit: Payer: Medicare Other | Admitting: Internal Medicine

## 2024-02-23 ENCOUNTER — Encounter: Payer: Self-pay | Admitting: Internal Medicine

## 2024-02-23 VITALS — BP 124/84 | HR 88 | Temp 98.0°F | Ht 70.5 in | Wt 187.3 lb

## 2024-02-23 DIAGNOSIS — E782 Mixed hyperlipidemia: Secondary | ICD-10-CM

## 2024-02-23 DIAGNOSIS — K219 Gastro-esophageal reflux disease without esophagitis: Secondary | ICD-10-CM | POA: Diagnosis not present

## 2024-02-23 DIAGNOSIS — R7989 Other specified abnormal findings of blood chemistry: Secondary | ICD-10-CM

## 2024-02-23 DIAGNOSIS — E785 Hyperlipidemia, unspecified: Secondary | ICD-10-CM | POA: Diagnosis not present

## 2024-02-23 DIAGNOSIS — Z0000 Encounter for general adult medical examination without abnormal findings: Secondary | ICD-10-CM | POA: Diagnosis not present

## 2024-02-23 DIAGNOSIS — M1990 Unspecified osteoarthritis, unspecified site: Secondary | ICD-10-CM | POA: Diagnosis not present

## 2024-02-23 DIAGNOSIS — Z1159 Encounter for screening for other viral diseases: Secondary | ICD-10-CM | POA: Diagnosis not present

## 2024-02-23 DIAGNOSIS — I10 Essential (primary) hypertension: Secondary | ICD-10-CM | POA: Diagnosis not present

## 2024-02-23 LAB — COMPREHENSIVE METABOLIC PANEL
ALT: 37 U/L (ref 0–53)
AST: 25 U/L (ref 0–37)
Albumin: 4.4 g/dL (ref 3.5–5.2)
Alkaline Phosphatase: 42 U/L (ref 39–117)
BUN: 25 mg/dL — ABNORMAL HIGH (ref 6–23)
CO2: 31 meq/L (ref 19–32)
Calcium: 9.5 mg/dL (ref 8.4–10.5)
Chloride: 99 meq/L (ref 96–112)
Creatinine, Ser: 1.11 mg/dL (ref 0.40–1.50)
GFR: 64.2 mL/min (ref >60.00)
Glucose, Bld: 120 mg/dL — ABNORMAL HIGH (ref 70–99)
Potassium: 3.2 meq/L — ABNORMAL LOW (ref 3.5–5.1)
Sodium: 140 meq/L (ref 135–145)
Total Bilirubin: 0.9 mg/dL (ref 0.2–1.2)
Total Protein: 7.2 g/dL (ref 6.0–8.3)

## 2024-02-23 LAB — LIPID PANEL
Cholesterol: 205 mg/dL — ABNORMAL HIGH (ref 0–200)
HDL: 61.6 mg/dL (ref >39.00)
LDL Cholesterol: 113 mg/dL — ABNORMAL HIGH (ref 0–99)
NonHDL: 142.9
Total CHOL/HDL Ratio: 3
Triglycerides: 148 mg/dL (ref 0.0–149.0)
VLDL: 29.6 mg/dL (ref 0.0–40.0)

## 2024-02-23 LAB — CBC WITH DIFFERENTIAL/PLATELET
Basophils Absolute: 0 10*3/uL (ref 0.0–0.1)
Basophils Relative: 0.3 % (ref 0.0–3.0)
Eosinophils Absolute: 0.1 10*3/uL (ref 0.0–0.7)
Eosinophils Relative: 1.3 % (ref 0.0–5.0)
HCT: 43 % (ref 39.0–52.0)
Hemoglobin: 14.6 g/dL (ref 13.0–17.0)
Lymphocytes Relative: 10.8 % — ABNORMAL LOW (ref 12.0–46.0)
Lymphs Abs: 0.8 10*3/uL (ref 0.7–4.0)
MCHC: 33.9 g/dL (ref 30.0–36.0)
MCV: 90.2 fL (ref 78.0–100.0)
Monocytes Absolute: 0.5 10*3/uL (ref 0.1–1.0)
Monocytes Relative: 6.1 % (ref 3.0–12.0)
Neutro Abs: 6 10*3/uL (ref 1.4–7.7)
Neutrophils Relative %: 81.5 % — ABNORMAL HIGH (ref 43.0–77.0)
Platelets: 191 10*3/uL (ref 150.0–400.0)
RBC: 4.76 Mil/uL (ref 4.22–5.81)
RDW: 13.6 % (ref 11.5–15.5)
WBC: 7.4 10*3/uL (ref 4.0–10.5)

## 2024-02-23 LAB — VITAMIN D 25 HYDROXY (VIT D DEFICIENCY, FRACTURES): VITD: 48.2 ng/mL (ref 30.00–100.00)

## 2024-02-23 NOTE — Progress Notes (Signed)
 Established Patient Office Visit     CC/Reason for Visit: Annual preventive exam and subsequent Medicare wellness visit  HPI: Antonio Newton is a 78 y.o. male who is coming in today for the above mentioned reasons. Past Medical History is significant for: Hypertension, hyperlipidemia, GERD, lumbar spinal stenosis, prostate cancer.  He is feeling well.  Has routine eye and dental care.  Is being followed by [facility] dermatology with a diagnosis of bullous pemphigoid.  Is currently on triamcinolone and niacin.  Is overdue for a Tdap, all cancer screening is up-to-date.   Past Medical/Surgical History: Past Medical History:  Diagnosis Date   Allergy    Anxiety    ED (erectile dysfunction)    Hypertension    Incarcerated right inguinal hernia 12/23/2016   Neuromuscular disorder (HCC)    neuropathy - mildly in bil hands-is improved since neck surgery   OA (osteoarthritis)    left hip   Pneumonia YRS AGO   Prostate cancer (HCC)    Spinal stenosis     Past Surgical History:  Procedure Laterality Date   ANTERIOR CERVICAL DECOMP/DISCECTOMY FUSION N/A 02/06/2016   Procedure: Cevical three-four, Cervical four-five, Cervical five-six anterior cervical decompression with fusion interbody prosthesis plating and bonegraft;  Surgeon: Lisbeth Renshaw, MD;  Location: [facility];  Service: Neurosurgery;  Laterality: N/A;   COLONOSCOPY     CYSTOSCOPY N/A 03/17/2020   Procedure: CYSTOSCOPY;  Surgeon: Heloise Purpura, MD;  Location: [facility];  Service: Urology;  Laterality: N/A;   INGUINAL HERNIA REPAIR Right 12/23/2016   Procedure: OPEN REPAIR RIGHT INGUINAL HERNIA WITH MESH;  Surgeon: Claud Kelp, MD;  Location: [facility];  Service: General;  Laterality: Right;   INSERTION OF MESH Right 12/23/2016   Procedure: INSERTION OF MESH;  Surgeon: Claud Kelp, MD;  Location: [facility];  Service: General;  Laterality: Right;   None     postate biopsy     RADIOACTIVE SEED IMPLANT  N/A 03/17/2020   Procedure: RADIOACTIVE SEED IMPLANT/BRACHYTHERAPY IMPLANT;  Surgeon: Heloise Purpura, MD;  Location: [facility];  Service: Urology;  Laterality: N/A;   SPACE OAR INSTILLATION N/A 03/17/2020   Procedure: SPACE OAR INSTILLATION;  Surgeon: Heloise Purpura, MD;  Location: [facility];  Service: Urology;  Laterality: N/A;   steroid injection to back  03/11/2020    Social History:  reports that he has never smoked. He has never used smokeless tobacco. He reports current alcohol use. He reports that he does not use drugs.  Allergies: No Known Allergies  Family History:  Family History  Problem Relation Age of Onset   Hypertension Father        Deceased, 76   Prostate cancer Father    Hypertension Mother        Deceased, 91   Renal cancer Mother        mets to lung   Colon cancer Mother    Healthy Sister    Esophageal cancer Paternal Grandfather    Rectal cancer Neg Hx    Stomach cancer Neg Hx    Breast cancer Neg Hx    Pancreatic cancer Neg Hx      Current Outpatient Medications:    amLODipine (NORVASC) 10 MG tablet, TAKE 1 TABLET BY MOUTH DAILY, Disp: 100 tablet, Rfl: 0   gabapentin (NEURONTIN) 300 MG capsule, TAKE 1 CAPSULE BY MOUTH TWICE  DAILY, Disp: 200 capsule, Rfl: 2   Ginger, Zingiber officinalis, (GINGER PO), Take 500 mg  by mouth daily at 12 noon., Disp: , Rfl:    Glycerin-Hypromellose-PEG 400 0.2-0.36-1 % SOLN, Place 1-2 drops into both eyes 3 (three) times daily as needed (for dry/tired eyes.)., Disp: , Rfl:    hydrochlorothiazide (HYDRODIURIL) 25 MG tablet, TAKE 1 TABLET BY MOUTH DAILY, Disp: 100 tablet, Rfl: 1   losartan (COZAAR) 100 MG tablet, TAKE 1 TABLET BY MOUTH DAILY, Disp: 100 tablet, Rfl: 1   Magnesium 500 MG TABS, Take by mouth. Take 1 tablet daily and at bedtime three nights per week, Disp: , Rfl:    Multiple Vitamins-Minerals (MULTIVITAMIN ADULT PO), Take by mouth., Disp: , Rfl:    potassium chloride SA (KLOR-CON  M) 20 MEQ tablet, TAKE 1 TABLET BY MOUTH DAILY, Disp: 100 tablet, Rfl: 2   sildenafil (VIAGRA) 100 MG tablet, Take 1 tablet (100 mg total) by mouth daily as needed., Disp: 10 tablet, Rfl: 11   triamcinolone ointment (KENALOG) 0.5 %, Apply 1 Application topically 2 (two) times daily., Disp: 45 g, Rfl: 0   Turmeric (QC TUMERIC COMPLEX PO), Take by mouth daily., Disp: , Rfl:   Review of Systems:  Negative unless indicated in HPI.   Physical Exam: Vitals:   02/23/24 0858  BP: 124/84  Pulse: 88  Temp: 98 F (36.7 C)  TempSrc: Oral  SpO2: 98%  Weight: 187 lb 4.8 oz (85 kg)  Height: 5' 10.5" (1.791 m)    Body mass index is 26.49 kg/m.   Physical Exam Vitals reviewed.  Constitutional:      General: He is not in acute distress.    Appearance: Normal appearance. He is not ill-appearing, toxic-appearing or diaphoretic.  HENT:     Head: Normocephalic.     Right Ear: Tympanic membrane, ear canal and external ear normal. There is no impacted cerumen.     Left Ear: Tympanic membrane, ear canal and external ear normal. There is no impacted cerumen.     Nose: Nose normal.     Mouth/Throat:     Mouth: Mucous membranes are moist.     Pharynx: Oropharynx is clear. No oropharyngeal exudate or posterior oropharyngeal erythema.  Eyes:     General: No scleral icterus.       Right eye: No discharge.        Left eye: No discharge.     Conjunctiva/sclera: Conjunctivae normal.     Pupils: Pupils are equal, round, and reactive to light.  Neck:     Vascular: No carotid bruit.  Cardiovascular:     Rate and Rhythm: Normal rate and regular rhythm.     Pulses: Normal pulses.     Heart sounds: Normal heart sounds.  Pulmonary:     Effort: Pulmonary effort is normal. No respiratory distress.     Breath sounds: Normal breath sounds.  Abdominal:     General: Abdomen is flat. Bowel sounds are normal.     Palpations: Abdomen is soft.  Musculoskeletal:        General: Normal range of motion.      Cervical back: Normal range of motion.  Skin:    General: Skin is warm and dry.  Neurological:     General: No focal deficit present.     Mental Status: He is alert and oriented to person, place, and time. Mental status is at baseline.  Psychiatric:        Mood and Affect: Mood normal.        Behavior: Behavior normal.  Thought Content: Thought content normal.        Judgment: Judgment normal.    Subsequent Medicare wellness visit   1. Risk factors, based on past  M,S,F - Cardiac Risk Factors include: advanced age (>79men, >25 women);hypertension   2.  Physical activities: Dietary issues and exercise activities discussed:      3.  Depression/mood:  Flowsheet Row Office Visit from 07/07/2023 in [facility] Total Score 0        4.  ADL's:    02/23/2024    8:50 AM  In your present state of health, do you have any difficulty performing the following activities:  Hearing? 0  Vision? 0  Difficulty concentrating or making decisions? 0  Walking or climbing stairs? 0  Dressing or bathing? 0  Doing errands, shopping? 0  Preparing Food and eating ? N  Using the Toilet? N  In the past six months, have you accidently leaked urine? N  Do you have problems with loss of bowel control? N  Managing your Medications? N  Managing your Finances? N  Housekeeping or managing your Housekeeping? N     5.  Fall risk:     01/05/2022    8:17 AM 01/05/2023    4:34 PM 07/04/2023    1:25 AM 07/07/2023    8:29 AM 02/23/2024    8:51 AM  Fall Risk  Falls in the past year? 0 0 0 0 0  Was there an injury with Fall?  0  0 0  Fall Risk Category Calculator  0  0 0  Fall Risk Category (Retired)  Low     (RETIRED) Patient Fall Risk Level  Low fall risk     Patient at Risk for Falls Due to  No Fall Risks     Fall risk Follow up  Falls evaluation completed  Falls evaluation completed Falls evaluation completed     6.  Home safety: No problems identified    7.  Height weight, and visual acuity: height and weight as above, vision/hearing: Vision Screening   Right eye Left eye Both eyes  Without correction     With correction 20/20 20/25 20/25      8.  Counseling: Counseling given: Not Answered    9. Lab orders based on risk factors: Laboratory update will be reviewed   10. Cognitive assessment:        02/23/2024    8:52 AM  6CIT Screen  What Year? 0 points  What month? 0 points  What time? 0 points  Count back from 20 0 points  Months in reverse 0 points  Repeat phrase 0 points  Total Score 0 points     11. Screening: Patient provided with a written and personalized 5-10 year screening schedule in the AVS. Health Maintenance  Topic Date Due   Hepatitis C Screening  Never done   DTaP/Tdap/Td vaccine (3 - Td or Tdap) 12/06/2022   COVID-19 Vaccine (9 - 2024-25 season) 11/15/2023   Medicare Annual Wellness Visit  02/22/2025   Pneumonia Vaccine  Completed   Flu Shot  Completed   Zoster (Shingles) Vaccine  Completed   HPV Vaccine  Aged Out   Colon Cancer Screening  Discontinued    12. Provider List Update: Patient Care Team    Relationship Specialty Notifications Start End  Philip Aspen, Limmie Patricia, MD PCP - General Internal Medicine  05/02/19      13. Advance Directives: Does Patient Have a  Medical Advance Directive?: No Would patient like information on creating a medical advance directive?: No - Patient declined  14. Opioids: Patient is not on any opioid prescriptions and has no risk factors for a substance use disorder.   15.   Goals      business     Evidence-based guidance:  Assess health literacy using Institute of Medicine recommended questions or standardized tools.  Use a universal method with health literacy to esure a nonjudgmental approach to varying levels of literacy.  Identify barriers to seeking or understanding information.  Note: Patient barriers may include literacy, language or culture,  mental health, stigma, embarrassment; clinician barriers may include avoidance, time constraint, stereotype or bias.  Collaborate with interprofessional team and child and parent/caregiver to develop a structured yet individualized education plan that supports shared decision-making.  Consider a combination of strategies such as print, audiotape, video and computer-based learning in a group or individual setting.  Encourage patient to seek health information and education in the community such as Engineering geologist, continuing education, English as a second language class, support group or peer Control and instrumentation engineer.  Provide educational materials that are written in plain language (3rd to 5th grade reading level) and include icons, pictures and tables; provide essential information first.   Notes:          I have personally reviewed and noted the following in the patient's chart:   Medical and social history Use of alcohol, tobacco or illicit drugs  Current medications and supplements Functional ability and status Nutritional status Physical activity Advanced directives List of other physicians Hospitalizations, surgeries, and ER visits in previous 12 months Vitals Screenings to include cognitive, depression, and falls Referrals and appointments  In addition, I have reviewed and discussed with patient certain preventive protocols, quality metrics, and best practice recommendations. A written personalized care plan for preventive services as well as general preventive health recommendations were provided to patient.   Impression and Plan:  Medicare annual wellness visit, subsequent  Essential hypertension -     CBC with Differential/Platelet; Future -     Comprehensive metabolic panel; Future -     VITAMIN D 25 Hydroxy (Vit-D Deficiency, Fractures); Future  Mixed hyperlipidemia -     Lipid panel; Future  Encounter for preventive health examination  Gastroesophageal reflux disease without  esophagitis  Hyperlipidemia, unspecified hyperlipidemia type  Osteoarthritis, unspecified osteoarthritis type, unspecified site  Low testosterone  Encounter for hepatitis C screening test for low risk patient -     Hepatitis C antibody; Future   -Recommend routine eye and dental care. -Healthy lifestyle discussed in detail. -Labs to be updated today. -Prostate cancer screening: Followed by urology for history of prostate cancer Health Maintenance  Topic Date Due   Hepatitis C Screening  Never done   DTaP/Tdap/Td vaccine (3 - Td or Tdap) 12/06/2022   COVID-19 Vaccine (9 - 2024-25 season) 11/15/2023   Medicare Annual Wellness Visit  02/22/2025   Pneumonia Vaccine  Completed   Flu Shot  Completed   Zoster (Shingles) Vaccine  Completed   HPV Vaccine  Aged Out   Colon Cancer Screening  Discontinued     -Tdap at pharmacy.    Chaya Jan, MD [facility]

## 2024-02-24 ENCOUNTER — Encounter: Payer: Self-pay | Admitting: Internal Medicine

## 2024-02-24 LAB — HEPATITIS C ANTIBODY: Hepatitis C Ab: NONREACTIVE

## 2024-02-29 ENCOUNTER — Encounter: Payer: Self-pay | Admitting: Internal Medicine

## 2024-02-29 ENCOUNTER — Other Ambulatory Visit: Payer: Self-pay | Admitting: Internal Medicine

## 2024-02-29 DIAGNOSIS — L120 Bullous pemphigoid: Secondary | ICD-10-CM | POA: Diagnosis not present

## 2024-02-29 DIAGNOSIS — E876 Hypokalemia: Secondary | ICD-10-CM

## 2024-03-28 ENCOUNTER — Other Ambulatory Visit (INDEPENDENT_AMBULATORY_CARE_PROVIDER_SITE_OTHER)

## 2024-03-28 DIAGNOSIS — E876 Hypokalemia: Secondary | ICD-10-CM

## 2024-03-28 LAB — BASIC METABOLIC PANEL WITH GFR
BUN: 21 mg/dL (ref 6–23)
CO2: 30 meq/L (ref 19–32)
Calcium: 9.2 mg/dL (ref 8.4–10.5)
Chloride: 102 meq/L (ref 96–112)
Creatinine, Ser: 1 mg/dL (ref 0.40–1.50)
GFR: 72.71 mL/min (ref >60.00)
Glucose, Bld: 97 mg/dL (ref 70–99)
Potassium: 3.2 meq/L — ABNORMAL LOW (ref 3.5–5.1)
Sodium: 141 meq/L (ref 135–145)

## 2024-04-02 ENCOUNTER — Encounter: Payer: Self-pay | Admitting: *Deleted

## 2024-04-02 ENCOUNTER — Other Ambulatory Visit: Payer: Self-pay | Admitting: *Deleted

## 2024-04-02 DIAGNOSIS — E876 Hypokalemia: Secondary | ICD-10-CM

## 2024-04-02 DIAGNOSIS — E782 Mixed hyperlipidemia: Secondary | ICD-10-CM

## 2024-04-02 MED ORDER — POTASSIUM CHLORIDE CRYS ER 20 MEQ PO TBCR
20.0000 meq | EXTENDED_RELEASE_TABLET | Freq: Two times a day (BID) | ORAL | 0 refills | Status: DC
Start: 2024-04-02 — End: 2024-04-17

## 2024-04-04 ENCOUNTER — Encounter: Payer: Self-pay | Admitting: Internal Medicine

## 2024-04-17 ENCOUNTER — Other Ambulatory Visit (INDEPENDENT_AMBULATORY_CARE_PROVIDER_SITE_OTHER)

## 2024-04-17 ENCOUNTER — Other Ambulatory Visit: Payer: Self-pay | Admitting: Internal Medicine

## 2024-04-17 ENCOUNTER — Telehealth: Payer: Self-pay | Admitting: *Deleted

## 2024-04-17 DIAGNOSIS — E876 Hypokalemia: Secondary | ICD-10-CM | POA: Diagnosis not present

## 2024-04-17 DIAGNOSIS — E782 Mixed hyperlipidemia: Secondary | ICD-10-CM

## 2024-04-17 LAB — POTASSIUM: Potassium: 2.8 meq/L — CL (ref 3.5–5.1)

## 2024-04-17 MED ORDER — POTASSIUM CHLORIDE CRYS ER 20 MEQ PO TBCR: 40.0000 meq | EXTENDED_RELEASE_TABLET | Freq: Two times a day (BID) | ORAL | 0 refills | Status: DC

## 2024-04-17 NOTE — Telephone Encounter (Signed)
 Patient is aware.  Lab appointment scheduled.

## 2024-04-17 NOTE — Telephone Encounter (Signed)
 CRITICAL VALUE STICKER  CRITICAL VALUE: potassium 2.8  RECEIVER (on-site recipient of call): Kayleen Party  DATE & TIME NOTIFIED: 04/17/24 11:37 am  MESSENGER (representative from lab):Clorinda Daniel  MD NOTIFIED: Ival Marines  TIME OF NOTIFICATION:11:38 am  RESPONSE:

## 2024-04-20 ENCOUNTER — Other Ambulatory Visit: Payer: Self-pay | Admitting: Internal Medicine

## 2024-04-20 DIAGNOSIS — I10 Essential (primary) hypertension: Secondary | ICD-10-CM

## 2024-05-02 ENCOUNTER — Other Ambulatory Visit (INDEPENDENT_AMBULATORY_CARE_PROVIDER_SITE_OTHER)

## 2024-05-02 DIAGNOSIS — E782 Mixed hyperlipidemia: Secondary | ICD-10-CM | POA: Diagnosis not present

## 2024-05-02 DIAGNOSIS — E876 Hypokalemia: Secondary | ICD-10-CM

## 2024-05-02 LAB — BASIC METABOLIC PANEL WITH GFR
BUN: 22 mg/dL (ref 6–23)
CO2: 29 meq/L (ref 19–32)
Calcium: 9.4 mg/dL (ref 8.4–10.5)
Chloride: 104 meq/L (ref 96–112)
Creatinine, Ser: 0.98 mg/dL (ref 0.40–1.50)
GFR: 74.45 mL/min (ref >60.00)
Glucose, Bld: 92 mg/dL (ref 70–99)
Potassium: 3.7 meq/L (ref 3.5–5.1)
Sodium: 142 meq/L (ref 135–145)

## 2024-05-02 LAB — LIPID PANEL
Cholesterol: 186 mg/dL (ref 0–200)
HDL: 53.9 mg/dL (ref >39.00)
LDL Cholesterol: 106 mg/dL — ABNORMAL HIGH (ref 0–99)
NonHDL: 132.37
Total CHOL/HDL Ratio: 3
Triglycerides: 133 mg/dL (ref 0.0–149.0)
VLDL: 26.6 mg/dL (ref 0.0–40.0)

## 2024-05-03 ENCOUNTER — Ambulatory Visit: Admitting: Internal Medicine

## 2024-05-03 ENCOUNTER — Encounter: Payer: Self-pay | Admitting: Internal Medicine

## 2024-05-03 VITALS — BP 149/93 | HR 75 | Temp 98.2°F | Wt 188.3 lb

## 2024-05-03 DIAGNOSIS — E876 Hypokalemia: Secondary | ICD-10-CM | POA: Diagnosis not present

## 2024-05-03 DIAGNOSIS — I10 Essential (primary) hypertension: Secondary | ICD-10-CM

## 2024-05-03 MED ORDER — CARVEDILOL 3.125 MG PO TABS: 3.1250 mg | ORAL_TABLET | Freq: Two times a day (BID) | ORAL | 3 refills | Status: DC

## 2024-05-03 NOTE — Progress Notes (Signed)
 Established Patient Office Visit     CC/Reason for Visit: Follow-up blood pressure and potassium  HPI: Antonio Newton is a 78 y.o. male who is coming in today for the above mentioned reasons.  He was having persistent hypokalemia.  We decided to discontinue his hydrochlorothiazide .  He is here today to discuss his blood pressure.  It is elevated into the 140/90 range.  His potassium level yesterday was normal at 3.7.   Past Medical/Surgical History: Past Medical History:  Diagnosis Date   Allergy    Anxiety    ED (erectile dysfunction)    Hypertension    Incarcerated right inguinal hernia 12/23/2016   Neuromuscular disorder (HCC)    neuropathy - mildly in bil hands-is improved since neck surgery   OA (osteoarthritis)    left hip   Pneumonia YRS AGO   Prostate cancer (HCC)    Spinal stenosis     Past Surgical History:  Procedure Laterality Date   ANTERIOR CERVICAL DECOMP/DISCECTOMY FUSION N/A 02/06/2016   Procedure: Cevical three-four, Cervical four-five, Cervical five-six anterior cervical decompression with fusion interbody prosthesis plating and bonegraft;  Surgeon: Augusto Blonder, MD;  Location: [facility];  Service: Neurosurgery;  Laterality: N/A;   COLONOSCOPY     CYSTOSCOPY N/A 03/17/2020   Procedure: CYSTOSCOPY;  Surgeon: Florencio Hunting, MD;  Location: [facility];  Service: Urology;  Laterality: N/A;   INGUINAL HERNIA REPAIR Right 12/23/2016   Procedure: OPEN REPAIR RIGHT INGUINAL HERNIA WITH MESH;  Surgeon: Boyce Byes, MD;  Location: [facility];  Service: General;  Laterality: Right;   INSERTION OF MESH Right 12/23/2016   Procedure: INSERTION OF MESH;  Surgeon: Boyce Byes, MD;  Location: [facility];  Service: General;  Laterality: Right;   None     postate biopsy     RADIOACTIVE SEED IMPLANT N/A 03/17/2020   Procedure: RADIOACTIVE SEED IMPLANT/BRACHYTHERAPY IMPLANT;  Surgeon: Florencio Hunting, MD;  Location: [facility];   Service: Urology;  Laterality: N/A;   SPACE OAR INSTILLATION N/A 03/17/2020   Procedure: SPACE OAR INSTILLATION;  Surgeon: Florencio Hunting, MD;  Location: [facility];  Service: Urology;  Laterality: N/A;   steroid injection to back  03/11/2020    Social History:  reports that he has never smoked. He has never used smokeless tobacco. He reports current alcohol use. He reports that he does not use drugs.  Allergies: No Known Allergies  Family History:  Family History  Problem Relation Age of Onset   Hypertension Father        Deceased, 78   Prostate cancer Father    Hypertension Mother        Deceased, 20   Renal cancer Mother        mets to lung   Colon cancer Mother    Healthy Sister    Esophageal cancer Paternal Grandfather    Rectal cancer Neg Hx    Stomach cancer Neg Hx    Breast cancer Neg Hx    Pancreatic cancer Neg Hx      Current Outpatient Medications:    amLODipine  (NORVASC ) 10 MG tablet, TAKE 1 TABLET BY MOUTH DAILY, Disp: 100 tablet, Rfl: 0   carvedilol (COREG) 3.125 MG tablet, Take 1 tablet (3.125 mg total) by mouth 2 (two) times daily with a meal., Disp: 60 tablet, Rfl: 3   gabapentin  (NEURONTIN ) 300 MG capsule, TAKE 1 CAPSULE BY MOUTH TWICE  DAILY, Disp: 200 capsule, Rfl: 2   Ginger,  Zingiber officinalis, (GINGER PO), Take 500 mg by mouth daily at 12 noon., Disp: , Rfl:    Glycerin-Hypromellose-PEG 400 0.2-0.36-1 % SOLN, Place 1-2 drops into both eyes 3 (three) times daily as needed (for dry/tired eyes.)., Disp: , Rfl:    losartan  (COZAAR ) 100 MG tablet, TAKE 1 TABLET BY MOUTH DAILY, Disp: 100 tablet, Rfl: 1   Magnesium  500 MG TABS, Take by mouth. Take 1 tablet daily and at bedtime three nights per week, Disp: , Rfl:    Multiple Vitamins-Minerals (MULTIVITAMIN ADULT PO), Take by mouth., Disp: , Rfl:    sildenafil  (VIAGRA ) 100 MG tablet, Take 1 tablet (100 mg total) by mouth daily as needed., Disp: 10 tablet, Rfl: 11   triamcinolone  ointment  (KENALOG ) 0.5 %, Apply 1 Application topically 2 (two) times daily., Disp: 45 g, Rfl: 0   Turmeric (QC TUMERIC COMPLEX PO), Take by mouth daily., Disp: , Rfl:   Review of Systems:  Negative unless indicated in HPI.   Physical Exam: Vitals:   05/03/24 0856 05/03/24 0859  BP: (!) 140/80 (!) 149/93  Pulse: 75   Temp: 98.2 F (36.8 C)   TempSrc: Oral   SpO2: 98%   Weight: 188 lb 4.8 oz (85.4 kg)     Body mass index is 26.64 kg/m.   Physical Exam Vitals reviewed.  Constitutional:      Appearance: Normal appearance.  HENT:     Head: Normocephalic and atraumatic.  Eyes:     Conjunctiva/sclera: Conjunctivae normal.  Cardiovascular:     Rate and Rhythm: Normal rate and regular rhythm.  Pulmonary:     Effort: Pulmonary effort is normal.     Breath sounds: Normal breath sounds.  Skin:    General: Skin is warm and dry.  Neurological:     General: No focal deficit present.     Mental Status: He is alert and oriented to person, place, and time.  Psychiatric:        Mood and Affect: Mood normal.        Behavior: Behavior normal.        Thought Content: Thought content normal.        Judgment: Judgment normal.      Impression and Plan:  Essential hypertension -     Carvedilol; Take 1 tablet (3.125 mg total) by mouth 2 (two) times daily with a meal.  Dispense: 60 tablet; Refill: 3  Hypokalemia   - Discontinue HCTZ and potassium supplements. - Continue amlodipine  and losartan , add carvedilol 3.125 mg twice daily.  Return in 6 to 8 weeks for follow-up.  Time spent:31 minutes reviewing chart, interviewing and examining patient and formulating plan of care.     Marguerita Shih, MD [facility]

## 2024-05-25 ENCOUNTER — Other Ambulatory Visit: Payer: Self-pay | Admitting: Internal Medicine

## 2024-06-06 ENCOUNTER — Other Ambulatory Visit: Payer: Self-pay | Admitting: Internal Medicine

## 2024-06-06 DIAGNOSIS — L120 Bullous pemphigoid: Secondary | ICD-10-CM | POA: Diagnosis not present

## 2024-06-20 ENCOUNTER — Ambulatory Visit (INDEPENDENT_AMBULATORY_CARE_PROVIDER_SITE_OTHER): Admitting: Internal Medicine

## 2024-06-20 ENCOUNTER — Encounter: Payer: Self-pay | Admitting: Internal Medicine

## 2024-06-20 VITALS — BP 150/84 | HR 88 | Temp 98.0°F | Wt 187.0 lb

## 2024-06-20 DIAGNOSIS — E876 Hypokalemia: Secondary | ICD-10-CM

## 2024-06-20 DIAGNOSIS — I10 Essential (primary) hypertension: Secondary | ICD-10-CM

## 2024-06-20 DIAGNOSIS — E782 Mixed hyperlipidemia: Secondary | ICD-10-CM

## 2024-06-20 MED ORDER — CARVEDILOL 6.25 MG PO TABS: 6.2500 mg | ORAL_TABLET | Freq: Two times a day (BID) | ORAL | 1 refills | Status: DC

## 2024-06-20 NOTE — Progress Notes (Signed)
 Established Patient Office Visit     CC/Reason for Visit: Follow-up blood pressure  HPI: Antonio Newton is a 78 y.o. male who is coming in today for the above mentioned reasons. Past Medical History is significant for: Hypertension, hyperlipidemia.  We have been having some trouble controlling his blood pressure since he had to come off of hydrochlorothiazide  due to persistent hypokalemia.  He is currently on amlodipine  10 mg and losartan  100 mg.  Last visit we added carvedilol  3.125 mg twice daily.  He is feeling well without concerns or complaints.  Ambulatory blood pressure measurements as follows:  142/91 156/79 140/87 137/85 150/91 147/82.   Past Medical/Surgical History: Past Medical History:  Diagnosis Date   Allergy    Anxiety    ED (erectile dysfunction)    Hypertension    Incarcerated right inguinal hernia 12/23/2016   Neuromuscular disorder (HCC)    neuropathy - mildly in bil hands-is improved since neck surgery   OA (osteoarthritis)    left hip   Pneumonia YRS AGO   Prostate cancer (HCC)    Spinal stenosis     Past Surgical History:  Procedure Laterality Date   ANTERIOR CERVICAL DECOMP/DISCECTOMY FUSION N/A 02/06/2016   Procedure: Cevical three-four, Cervical four-five, Cervical five-six anterior cervical decompression with fusion interbody prosthesis plating and bonegraft;  Surgeon: Gerldine Maizes, MD;  Location: [facility];  Service: Neurosurgery;  Laterality: N/A;   COLONOSCOPY     CYSTOSCOPY N/A 03/17/2020   Procedure: CYSTOSCOPY;  Surgeon: Renda Glance, MD;  Location: [facility];  Service: Urology;  Laterality: N/A;   INGUINAL HERNIA REPAIR Right 12/23/2016   Procedure: OPEN REPAIR RIGHT INGUINAL HERNIA WITH MESH;  Surgeon: Elon Pacini, MD;  Location: [facility];  Service: General;  Laterality: Right;   INSERTION OF MESH Right 12/23/2016   Procedure: INSERTION OF MESH;  Surgeon: Elon Pacini, MD;  Location: [facility];   Service: General;  Laterality: Right;   None     postate biopsy     RADIOACTIVE SEED IMPLANT N/A 03/17/2020   Procedure: RADIOACTIVE SEED IMPLANT/BRACHYTHERAPY IMPLANT;  Surgeon: Renda Glance, MD;  Location: [facility];  Service: Urology;  Laterality: N/A;   SPACE OAR INSTILLATION N/A 03/17/2020   Procedure: SPACE OAR INSTILLATION;  Surgeon: Renda Glance, MD;  Location: [facility];  Service: Urology;  Laterality: N/A;   steroid injection to back  03/11/2020    Social History:  reports that he has never smoked. He has never used smokeless tobacco. He reports current alcohol use. He reports that he does not use drugs.  Allergies: No Known Allergies  Family History:  Family History  Problem Relation Age of Onset   Hypertension Father        Deceased, 6   Prostate cancer Father    Hypertension Mother        Deceased, 74   Renal cancer Mother        mets to lung   Colon cancer Mother    Healthy Sister    Esophageal cancer Paternal Grandfather    Rectal cancer Neg Hx    Stomach cancer Neg Hx    Breast cancer Neg Hx    Pancreatic cancer Neg Hx      Current Outpatient Medications:    amLODipine  (NORVASC ) 10 MG tablet, TAKE 1 TABLET BY MOUTH DAILY, Disp: 100 tablet, Rfl: 0   doxycycline  (VIBRA -TABS) 100 MG tablet, Take 100 mg by mouth., Disp: , Rfl:  FA-B6-B12-Omega 3-Phytosterols (BP VIT 3 PO), Take by mouth., Disp: , Rfl:    gabapentin  (NEURONTIN ) 300 MG capsule, TAKE 1 CAPSULE BY MOUTH TWICE  DAILY, Disp: 200 capsule, Rfl: 2   Ginger, Zingiber officinalis, (GINGER PO), Take 500 mg by mouth daily at 12 noon., Disp: , Rfl:    Glycerin-Hypromellose-PEG 400 0.2-0.36-1 % SOLN, Place 1-2 drops into both eyes 3 (three) times daily as needed (for dry/tired eyes.)., Disp: , Rfl:    losartan  (COZAAR ) 100 MG tablet, TAKE 1 TABLET BY MOUTH DAILY, Disp: 100 tablet, Rfl: 1   Magnesium  500 MG TABS, Take by mouth. Take 1 tablet daily and at bedtime three  nights per week, Disp: , Rfl:    Multiple Vitamins-Minerals (MULTIVITAMIN ADULT PO), Take by mouth., Disp: , Rfl:    predniSONE (DELTASONE) 1 MG tablet, Take 4 mg by mouth., Disp: , Rfl:    sildenafil  (VIAGRA ) 100 MG tablet, Take 1 tablet (100 mg total) by mouth daily as needed., Disp: 10 tablet, Rfl: 11   triamcinolone  ointment (KENALOG ) 0.5 %, Apply 1 Application topically 2 (two) times daily., Disp: 45 g, Rfl: 0   Turmeric (QC TUMERIC COMPLEX PO), Take by mouth daily., Disp: , Rfl:    carvedilol  (COREG ) 6.25 MG tablet, Take 1 tablet (6.25 mg total) by mouth 2 (two) times daily with a meal., Disp: 180 tablet, Rfl: 1  Review of Systems:  Negative unless indicated in HPI.   Physical Exam: Vitals:   06/20/24 0912 06/20/24 0916  BP: (!) 180/100 (!) 150/84  Pulse: 88   Temp: 98 F (36.7 C)   TempSrc: Oral   SpO2: 98%   Weight: 187 lb (84.8 kg)     Body mass index is 26.45 kg/m.   Physical Exam Vitals reviewed.  Constitutional:      Appearance: Normal appearance.  HENT:     Head: Normocephalic and atraumatic.   Eyes:     Conjunctiva/sclera: Conjunctivae normal.    Cardiovascular:     Rate and Rhythm: Normal rate and regular rhythm.  Pulmonary:     Effort: Pulmonary effort is normal.     Breath sounds: Normal breath sounds.   Skin:    General: Skin is warm and dry.   Neurological:     General: No focal deficit present.     Mental Status: He is alert and oriented to person, place, and time.   Psychiatric:        Mood and Affect: Mood normal.        Behavior: Behavior normal.        Thought Content: Thought content normal.        Judgment: Judgment normal.      Impression and Plan:  Essential hypertension -     Carvedilol ; Take 1 tablet (6.25 mg total) by mouth 2 (two) times daily with a meal.  Dispense: 180 tablet; Refill: 1  Mixed hyperlipidemia  Hypokalemia  -Blood pressure remains suboptimally controlled.  Increase carvedilol  to 6.25 mg twice daily,  continue maximum doses of amlodipine  and losartan .  Continue ambulatory blood pressure measurements and return in 6 to 8 weeks for follow-up.   Time spent:30 minutes reviewing chart, interviewing and examining patient and formulating plan of care.     Tully Theophilus Andrews, MD [facility]

## 2024-07-11 ENCOUNTER — Other Ambulatory Visit: Payer: Self-pay | Admitting: Internal Medicine

## 2024-07-11 DIAGNOSIS — I10 Essential (primary) hypertension: Secondary | ICD-10-CM

## 2024-07-23 ENCOUNTER — Other Ambulatory Visit: Payer: Self-pay | Admitting: Internal Medicine

## 2024-07-23 MED ORDER — AMLODIPINE BESYLATE 10 MG PO TABS: 10.0000 mg | ORAL_TABLET | Freq: Every day | ORAL | 1 refills | Status: DC

## 2024-07-23 NOTE — Telephone Encounter (Signed)
 Copied from CRM [id number]. Topic: Clinical - Medication Refill >> Jul 23, 2024 10:34 AM Janeecia G wrote: Medication: Amlodipine  10 MG   Has the patient contacted their pharmacy? No (Agent: If no, request that the patient contact the pharmacy for the refill. If patient does not wish to contact the pharmacy document the reason why and proceed with request.) (Agent: If yes, when and what did the pharmacy advise?)  This is the patient's preferred pharmacy:  [facility] [address] [place] - [address] [place] [address] Phone: [phone] Fax: [phone]  Is this the correct pharmacy for this prescription? Yes If no, delete pharmacy and type the correct one.   Has the prescription been filled recently? No  Is the patient out of the medication? Yes  Has the patient been seen for an appointment in the last year OR does the patient have an upcoming appointment? Yes  Can we respond through MyChart? No  Agent: Please be advised that Rx refills may take up to 3 business days. We ask that you follow-up with your pharmacy.

## 2024-07-25 ENCOUNTER — Other Ambulatory Visit: Payer: Self-pay | Admitting: Internal Medicine

## 2024-07-25 NOTE — Telephone Encounter (Unsigned)
 Copied from CRM [id number]. Topic: Clinical - Medication Refill >> Jul 25, 2024 10:12 AM Shereese L wrote: Medication: gabapentin  (NEURONTIN ) 300 MG capsule  Has the patient contacted their pharmacy? Yes (Agent: If no, request that the patient contact the pharmacy for the refill. If patient does not wish to contact the pharmacy document the reason why and proceed with request.) (Agent: If yes, when and what did the pharmacy advise?)  This is the patient's preferred pharmacy:  [facility] [address], [place] - [address] [place] [address] Phone: [phone] Fax: [phone]   Is this the correct pharmacy for this prescription? Yes If no, delete pharmacy and type the correct one.   Has the prescription been filled recently? Yes  Is the patient out of the medication? Yes  Has the patient been seen for an appointment in the last year OR does the patient have an upcoming appointment? Yes  Can we respond through MyChart? Yes  Agent: Please be advised that Rx refills may take up to 3 business days. We ask that you follow-up with your pharmacy.

## 2024-07-26 ENCOUNTER — Other Ambulatory Visit: Payer: Self-pay | Admitting: *Deleted

## 2024-07-26 MED ORDER — GABAPENTIN 300 MG PO CAPS: ORAL_CAPSULE | ORAL | 2 refills | Status: DC

## 2024-07-26 NOTE — Telephone Encounter (Signed)
 Copied from CRM [id number]. Topic: Clinical - Medication Refill >> Jul 25, 2024 10:12 AM Shereese L wrote: Medication: gabapentin  (NEURONTIN ) 300 MG capsule  Has the patient contacted their pharmacy? Yes (Agent: If no, request that the patient contact the pharmacy for the refill. If patient does not wish to contact the pharmacy document the reason why and proceed with request.) (Agent: If yes, when and what did the pharmacy advise?)  This is the patient's preferred pharmacy:  [facility] [address] [place] - [address] [place] [address] Phone: [phone] Fax: [phone]   Is this the correct pharmacy for this prescription? Yes If no, delete pharmacy and type the correct one.   Has the prescription been filled recently? Yes  Is the patient out of the medication? Yes  Has the patient been seen for an appointment in the last year OR does the patient have an upcoming appointment? Yes  Can we respond through MyChart? Yes  Agent: Please be advised that Rx refills may take up to 3 business days. We ask that you follow-up with your pharmacy. >> Jul 26, 2024 12:21 PM Robinson H wrote: Patient is following up on medication refill submitted on 7/30 advised of medication refill turnaround time, patient states he's been out for 2 days, please reach out to patient.  Johan [phone]

## 2024-08-13 ENCOUNTER — Other Ambulatory Visit: Payer: Self-pay | Admitting: Internal Medicine

## 2024-08-13 DIAGNOSIS — I10 Essential (primary) hypertension: Secondary | ICD-10-CM

## 2024-08-15 ENCOUNTER — Ambulatory Visit: Admitting: Internal Medicine

## 2024-08-15 ENCOUNTER — Encounter: Payer: Self-pay | Admitting: Internal Medicine

## 2024-08-15 VITALS — BP 154/96 | HR 64 | Temp 97.5°F | Wt 192.4 lb

## 2024-08-15 DIAGNOSIS — I10 Essential (primary) hypertension: Secondary | ICD-10-CM | POA: Diagnosis not present

## 2024-08-15 DIAGNOSIS — G629 Polyneuropathy, unspecified: Secondary | ICD-10-CM | POA: Diagnosis not present

## 2024-08-15 MED ORDER — GABAPENTIN 300 MG PO CAPS: ORAL_CAPSULE | ORAL | 2 refills | Status: AC

## 2024-08-15 MED ORDER — CARVEDILOL 12.5 MG PO TABS
12.5000 mg | ORAL_TABLET | Freq: Two times a day (BID) | ORAL | 1 refills | Status: DC
Start: 2024-08-15 — End: 2024-10-11

## 2024-08-15 NOTE — Progress Notes (Signed)
 Established Patient Office Visit     CC/Reason for Visit: Follow-up blood pressure, requesting increase gabapentin  dose  HPI: Antonio Newton is a 78 y.o. male who is coming in today for the above mentioned reasons. Past Medical History is significant for: Hypertension.  He is on amlodipine  10 mg and losartan  100 mg.  At last visit his carvedilol  was increased to 6.25 mg twice daily.  Ever since coming off hydrochlorothiazide  due to persistent hypokalemia we have been having issues controlling blood pressure.  Home blood pressures: 136/78, 139/83, 150/79, 138/84.  He has been feeling well.  He is also requesting to increase his gabapentin  from 2 to 3 tablets.   Past Medical/Surgical History: Past Medical History:  Diagnosis Date   Allergy    Anxiety    ED (erectile dysfunction)    Hypertension    Incarcerated right inguinal hernia 12/23/2016   Neuromuscular disorder (HCC)    neuropathy - mildly in bil hands-is improved since neck surgery   OA (osteoarthritis)    left hip   Pneumonia YRS AGO   Prostate cancer (HCC)    Spinal stenosis     Past Surgical History:  Procedure Laterality Date   ANTERIOR CERVICAL DECOMP/DISCECTOMY FUSION N/A 02/06/2016   Procedure: Cevical three-four, Cervical four-five, Cervical five-six anterior cervical decompression with fusion interbody prosthesis plating and bonegraft;  Surgeon: Gerldine Maizes, MD;  Location: [facility];  Service: Neurosurgery;  Laterality: N/A;   COLONOSCOPY     CYSTOSCOPY N/A 03/17/2020   Procedure: CYSTOSCOPY;  Surgeon: Renda Glance, MD;  Location: [facility];  Service: Urology;  Laterality: N/A;   INGUINAL HERNIA REPAIR Right 12/23/2016   Procedure: OPEN REPAIR RIGHT INGUINAL HERNIA WITH MESH;  Surgeon: Elon Pacini, MD;  Location: [facility];  Service: General;  Laterality: Right;   INSERTION OF MESH Right 12/23/2016   Procedure: INSERTION OF MESH;  Surgeon: Elon Pacini, MD;  Location: [facility];   Service: General;  Laterality: Right;   None     postate biopsy     RADIOACTIVE SEED IMPLANT N/A 03/17/2020   Procedure: RADIOACTIVE SEED IMPLANT/BRACHYTHERAPY IMPLANT;  Surgeon: Renda Glance, MD;  Location: [facility];  Service: Urology;  Laterality: N/A;   SPACE OAR INSTILLATION N/A 03/17/2020   Procedure: SPACE OAR INSTILLATION;  Surgeon: Renda Glance, MD;  Location: [facility];  Service: Urology;  Laterality: N/A;   steroid injection to back  03/11/2020    Social History:  reports that he has never smoked. He has never used smokeless tobacco. He reports current alcohol use. He reports that he does not use drugs.  Allergies: No Known Allergies  Family History:  Family History  Problem Relation Age of Onset   Hypertension Father        Deceased, 27   Prostate cancer Father    Hypertension Mother        Deceased, 40   Renal cancer Mother        mets to lung   Colon cancer Mother    Healthy Sister    Esophageal cancer Paternal Grandfather    Rectal cancer Neg Hx    Stomach cancer Neg Hx    Breast cancer Neg Hx    Pancreatic cancer Neg Hx      Current Outpatient Medications:    amLODipine  (NORVASC ) 10 MG tablet, Take 1 tablet (10 mg total) by mouth daily., Disp: 100 tablet, Rfl: 1   doxycycline  (VIBRA -TABS) 100 MG tablet, Take  100 mg by mouth., Disp: , Rfl:    FA-B6-B12-Omega 3-Phytosterols (BP VIT 3 PO), Take by mouth., Disp: , Rfl:    Ginger, Zingiber officinalis, (GINGER PO), Take 500 mg by mouth daily at 12 noon., Disp: , Rfl:    Glycerin-Hypromellose-PEG 400 0.2-0.36-1 % SOLN, Place 1-2 drops into both eyes 3 (three) times daily as needed (for dry/tired eyes.)., Disp: , Rfl:    losartan  (COZAAR ) 100 MG tablet, TAKE 1 TABLET BY MOUTH DAILY, Disp: 100 tablet, Rfl: 1   Magnesium  500 MG TABS, Take by mouth. Take 1 tablet daily and at bedtime three nights per week, Disp: , Rfl:    Multiple Vitamins-Minerals (MULTIVITAMIN ADULT PO), Take by  mouth., Disp: , Rfl:    predniSONE (DELTASONE) 1 MG tablet, Take 4 mg by mouth., Disp: , Rfl:    sildenafil  (VIAGRA ) 100 MG tablet, Take 1 tablet (100 mg total) by mouth daily as needed., Disp: 10 tablet, Rfl: 11   triamcinolone  ointment (KENALOG ) 0.5 %, Apply 1 Application topically 2 (two) times daily., Disp: 45 g, Rfl: 0   Turmeric (QC TUMERIC COMPLEX PO), Take by mouth daily., Disp: , Rfl:    carvedilol  (COREG ) 12.5 MG tablet, Take 1 tablet (12.5 mg total) by mouth 2 (two) times daily with a meal., Disp: 180 tablet, Rfl: 1   gabapentin  (NEURONTIN ) 300 MG capsule, TAKE 3 CAPSULES BY MOUTH  AT BEDTIME, Disp: 300 capsule, Rfl: 2  Review of Systems:  Negative unless indicated in HPI.   Physical Exam: Vitals:   08/15/24 0917 08/15/24 0920  BP: (!) 150/80 (!) 154/96  Pulse: 64   Temp: (!) 97.5 F (36.4 C)   TempSrc: Oral   SpO2: 98%   Weight: 192 lb 6.4 oz (87.3 kg)     Body mass index is 27.22 kg/m.   Physical Exam Vitals reviewed.  Constitutional:      Appearance: Normal appearance.  HENT:     Head: Normocephalic and atraumatic.  Eyes:     Conjunctiva/sclera: Conjunctivae normal.  Cardiovascular:     Rate and Rhythm: Normal rate and regular rhythm.  Pulmonary:     Effort: Pulmonary effort is normal.     Breath sounds: Normal breath sounds.  Skin:    General: Skin is warm and dry.  Neurological:     General: No focal deficit present.     Mental Status: He is alert and oriented to person, place, and time.  Psychiatric:        Mood and Affect: Mood normal.        Behavior: Behavior normal.        Thought Content: Thought content normal.        Judgment: Judgment normal.      Impression and Plan:  Essential hypertension -     Carvedilol ; Take 1 tablet (12.5 mg total) by mouth 2 (two) times daily with a meal.  Dispense: 180 tablet; Refill: 1  Neuropathy -     Gabapentin ; TAKE 3 CAPSULES BY MOUTH  AT BEDTIME  Dispense: 300 capsule; Refill: 2  -Blood pressure is  suboptimally controlled.  Increase carvedilol  to 12.5 mg twice daily and follow-up in 8 weeks. - Okay to increase gabapentin  to 3 capsules at bedtime.   Time spent:30 minutes reviewing chart, interviewing and examining patient and formulating plan of care.     Tully Theophilus Andrews, MD [facility] at [facility]

## 2024-09-14 ENCOUNTER — Other Ambulatory Visit: Payer: Self-pay | Admitting: Internal Medicine

## 2024-09-14 DIAGNOSIS — I10 Essential (primary) hypertension: Secondary | ICD-10-CM

## 2024-09-14 NOTE — Telephone Encounter (Signed)
 Copied from CRM #[id number]. Topic: Clinical - Medication Refill >> Sep 14, 2024  3:49 PM Tiffini S wrote: Medication: losartan  (COZAAR ) 100 MG tablet  Has the patient contacted their pharmacy? No (Agent: If no, request that the patient contact the pharmacy for the refill. If patient does not wish to contact the pharmacy document the reason why and proceed with request.) (Agent: If yes, when and what did the pharmacy advise?)  This is the patient's preferred pharmacy:  [facility] [address], [place] - [address] [place] [address] Phone: [phone] Fax: [phone]   Is this the correct pharmacy for this prescription? Yes If no, delete pharmacy and type the correct one.   Has the prescription been filled recently? Yes  Is the patient out of the medication? Yes, have a few tablets until Tuesday   Has the patient been seen for an appointment in the last year OR does the patient have an upcoming appointment? Yes  Can we respond through MyChart? Yes or please call at [phone]  Agent: Please be advised that Rx refills may take up to 3 business days. We ask that you follow-up with your pharmacy.

## 2024-09-17 MED ORDER — LOSARTAN POTASSIUM 100 MG PO TABS: 100.0000 mg | ORAL_TABLET | Freq: Every day | ORAL | 1 refills | Status: AC

## 2024-10-01 ENCOUNTER — Encounter: Payer: Self-pay | Admitting: Internal Medicine

## 2024-10-02 ENCOUNTER — Ambulatory Visit: Payer: Self-pay | Admitting: Internal Medicine

## 2024-10-02 ENCOUNTER — Other Ambulatory Visit: Payer: Self-pay | Admitting: Internal Medicine

## 2024-10-02 ENCOUNTER — Other Ambulatory Visit (INDEPENDENT_AMBULATORY_CARE_PROVIDER_SITE_OTHER)

## 2024-10-02 DIAGNOSIS — E782 Mixed hyperlipidemia: Secondary | ICD-10-CM

## 2024-10-02 LAB — LIPID PANEL
Cholesterol: 179 mg/dL (ref 0–200)
HDL: 52.7 mg/dL (ref >39.00)
LDL Cholesterol: 106 mg/dL — ABNORMAL HIGH (ref 0–99)
NonHDL: 125.94
Total CHOL/HDL Ratio: 3
Triglycerides: 100 mg/dL (ref 0.0–149.0)
VLDL: 20 mg/dL (ref 0.0–40.0)

## 2024-10-03 ENCOUNTER — Ambulatory Visit

## 2024-10-03 DIAGNOSIS — L120 Bullous pemphigoid: Secondary | ICD-10-CM | POA: Diagnosis not present

## 2024-10-11 ENCOUNTER — Encounter: Payer: Self-pay | Admitting: Internal Medicine

## 2024-10-11 ENCOUNTER — Ambulatory Visit: Admitting: Internal Medicine

## 2024-10-11 VITALS — BP 150/85 | HR 69 | Temp 97.9°F | Ht 72.0 in | Wt 197.9 lb

## 2024-10-11 DIAGNOSIS — M7022 Olecranon bursitis, left elbow: Secondary | ICD-10-CM | POA: Diagnosis not present

## 2024-10-11 DIAGNOSIS — Z23 Encounter for immunization: Secondary | ICD-10-CM | POA: Diagnosis not present

## 2024-10-11 DIAGNOSIS — I10 Essential (primary) hypertension: Secondary | ICD-10-CM | POA: Diagnosis not present

## 2024-10-11 MED ORDER — CARVEDILOL 25 MG PO TABS: 25.0000 mg | ORAL_TABLET | Freq: Two times a day (BID) | ORAL | 1 refills | Status: DC

## 2024-10-11 NOTE — Progress Notes (Signed)
 Established Patient Office Visit     CC/Reason for Visit: Follow-up blood pressure, discuss acute concerns  HPI: Antonio Newton is a 78 y.o. male who is coming in today for the above mentioned reasons. Past Medical History is significant for: Hypertension.  He is here to follow-up after we had increased his carvedilol  to 12.5 mg twice daily at last visit.  Home ambulatory measurements are: 149/84, 157/84, 142/84.  He has been feeling well.  Is requesting a flu vaccine.  He has noticed pain and swelling over his left elbow.  It got better after taking some Advil.  He spends a lot of time with his elbow on the desk and the armrest of a hard chair.   Past Medical/Surgical History: Past Medical History:  Diagnosis Date   Allergy    Anxiety    ED (erectile dysfunction)    Hypertension    Incarcerated right inguinal hernia 12/23/2016   Neuromuscular disorder (HCC)    neuropathy - mildly in bil hands-is improved since neck surgery   OA (osteoarthritis)    left hip   Pneumonia YRS AGO   Prostate cancer (HCC)    Spinal stenosis     Past Surgical History:  Procedure Laterality Date   ANTERIOR CERVICAL DECOMP/DISCECTOMY FUSION N/A 02/06/2016   Procedure: Cevical three-four, Cervical four-five, Cervical five-six anterior cervical decompression with fusion interbody prosthesis plating and bonegraft;  Surgeon: Gerldine Maizes, MD;  Location: [facility];  Service: Neurosurgery;  Laterality: N/A;   COLONOSCOPY     CYSTOSCOPY N/A 03/17/2020   Procedure: CYSTOSCOPY;  Surgeon: Renda Glance, MD;  Location: [facility];  Service: Urology;  Laterality: N/A;   INGUINAL HERNIA REPAIR Right 12/23/2016   Procedure: OPEN REPAIR RIGHT INGUINAL HERNIA WITH MESH;  Surgeon: Elon Pacini, MD;  Location: [facility];  Service: General;  Laterality: Right;   INSERTION OF MESH Right 12/23/2016   Procedure: INSERTION OF MESH;  Surgeon: Elon Pacini, MD;  Location: [facility];  Service:  General;  Laterality: Right;   None     postate biopsy     RADIOACTIVE SEED IMPLANT N/A 03/17/2020   Procedure: RADIOACTIVE SEED IMPLANT/BRACHYTHERAPY IMPLANT;  Surgeon: Renda Glance, MD;  Location: [facility];  Service: Urology;  Laterality: N/A;   SPACE OAR INSTILLATION N/A 03/17/2020   Procedure: SPACE OAR INSTILLATION;  Surgeon: Renda Glance, MD;  Location: [facility];  Service: Urology;  Laterality: N/A;   steroid injection to back  03/11/2020    Social History:  reports that he has never smoked. He has never used smokeless tobacco. He reports current alcohol use. He reports that he does not use drugs.  Allergies: No Known Allergies  Family History:  Family History  Problem Relation Age of Onset   Hypertension Father        Deceased, 78   Prostate cancer Father    Hypertension Mother        Deceased, 69   Renal cancer Mother        mets to lung   Colon cancer Mother    Healthy Sister    Esophageal cancer Paternal Grandfather    Rectal cancer Neg Hx    Stomach cancer Neg Hx    Breast cancer Neg Hx    Pancreatic cancer Neg Hx      Current Outpatient Medications:    amLODipine  (NORVASC ) 10 MG tablet, Take 1 tablet (10 mg total) by mouth daily., Disp: 100 tablet, Rfl: 1  doxycycline  (VIBRA -TABS) 100 MG tablet, Take 100 mg by mouth., Disp: , Rfl:    FA-B6-B12-Omega 3-Phytosterols (BP VIT 3 PO), Take by mouth., Disp: , Rfl:    gabapentin  (NEURONTIN ) 300 MG capsule, TAKE 3 CAPSULES BY MOUTH  AT BEDTIME, Disp: 300 capsule, Rfl: 2   Ginger, Zingiber officinalis, (GINGER PO), Take 500 mg by mouth daily at 12 noon., Disp: , Rfl:    Glycerin-Hypromellose-PEG 400 0.2-0.36-1 % SOLN, Place 1-2 drops into both eyes 3 (three) times daily as needed (for dry/tired eyes.)., Disp: , Rfl:    losartan  (COZAAR ) 100 MG tablet, Take 1 tablet (100 mg total) by mouth daily., Disp: 100 tablet, Rfl: 1   Magnesium  500 MG TABS, Take by mouth. Take 1 tablet daily  and at bedtime three nights per week, Disp: , Rfl:    Multiple Vitamins-Minerals (MULTIVITAMIN ADULT PO), Take by mouth., Disp: , Rfl:    predniSONE (DELTASONE) 10 MG tablet, Take 40 mg by mouth., Disp: , Rfl:    sildenafil  (VIAGRA ) 100 MG tablet, Take 1 tablet (100 mg total) by mouth daily as needed., Disp: 10 tablet, Rfl: 11   triamcinolone  ointment (KENALOG ) 0.5 %, Apply 1 Application topically 2 (two) times daily., Disp: 45 g, Rfl: 0   Turmeric (QC TUMERIC COMPLEX PO), Take by mouth daily., Disp: , Rfl:    carvedilol  (COREG ) 25 MG tablet, Take 1 tablet (25 mg total) by mouth 2 (two) times daily with a meal., Disp: 90 tablet, Rfl: 1   predniSONE (DELTASONE) 1 MG tablet, Take 4 mg by mouth., Disp: , Rfl:   Review of Systems:  Negative unless indicated in HPI.   Physical Exam: Vitals:   10/11/24 0817 10/11/24 0829  BP: 130/78 128/78  Pulse: 69   Temp: 97.9 F (36.6 C)   SpO2: 98%   Weight: 197 lb 14.4 oz (89.8 kg)   Height: 6' (1.829 m)     Body mass index is 26.84 kg/m.   Physical Exam Vitals reviewed.  Constitutional:      Appearance: Normal appearance.  HENT:     Head: Normocephalic and atraumatic.  Eyes:     Conjunctiva/sclera: Conjunctivae normal.  Cardiovascular:     Rate and Rhythm: Normal rate and regular rhythm.  Pulmonary:     Effort: Pulmonary effort is normal.     Breath sounds: Normal breath sounds.  Musculoskeletal:     Right elbow: Normal.     Left elbow: Swelling present.       Arms:  Skin:    General: Skin is warm and dry.  Neurological:     General: No focal deficit present.     Mental Status: He is alert and oriented to person, place, and time.  Psychiatric:        Mood and Affect: Mood normal.        Behavior: Behavior normal.        Thought Content: Thought content normal.        Judgment: Judgment normal.      Impression and Plan:  Essential hypertension -     Carvedilol ; Take 1 tablet (25 mg total) by mouth 2 (two) times daily  with a meal.  Dispense: 90 tablet; Refill: 1  Need for immunization against influenza -     Flu vaccine HIGH DOSE PF(Fluzone Trivalent)  Olecranon bursitis, left elbow  - Blood pressure still not well-controlled.  Continue losartan  100 mg, amlodipine  10 mg and increase carvedilol  to 25 mg twice daily.  Continue ambulatory  blood pressure measurements and return in 2 months for follow-up. - Flu vaccine administered in office today. - For his left olecranon bursitis have advised NSAIDs as needed, padded elbow brace and icing.  Time spent:32 minutes reviewing chart, interviewing and examining patient and formulating plan of care.     Tully Theophilus Andrews, MD [facility] at [facility]

## 2024-10-30 ENCOUNTER — Encounter: Payer: Self-pay | Admitting: Internal Medicine

## 2024-11-01 ENCOUNTER — Encounter: Payer: Self-pay | Admitting: Internal Medicine

## 2024-11-01 ENCOUNTER — Ambulatory Visit: Admitting: Internal Medicine

## 2024-11-01 VITALS — BP 148/79 | HR 70 | Temp 98.1°F | Wt 198.9 lb

## 2024-11-01 DIAGNOSIS — I10 Essential (primary) hypertension: Secondary | ICD-10-CM

## 2024-11-01 DIAGNOSIS — R600 Localized edema: Secondary | ICD-10-CM | POA: Diagnosis not present

## 2024-11-01 MED ORDER — AMLODIPINE BESYLATE 5 MG PO TABS: 5.0000 mg | ORAL_TABLET | Freq: Every day | ORAL | 1 refills | Status: AC

## 2024-11-01 NOTE — Progress Notes (Signed)
 Established Patient Office Visit     CC/Reason for Visit: Leg edema  HPI: Antonio Newton is a 78 y.o. male who is coming in today for the above mentioned reasons.  Ever since coming off hydrochlorothiazide  due to persistent hypokalemia we have been having issues controlling his blood pressure.  He is currently on losartan  100 mg, amlodipine  10 mg and carvedilol  25 mg twice daily.  Despite this he continues to have elevated home blood pressures and in addition in the last couple weeks has started developing bilateral ankle edema.  No chest pain or shortness of breath.   Past Medical/Surgical History: Past Medical History:  Diagnosis Date   Allergy    Anxiety    ED (erectile dysfunction)    Hypertension    Incarcerated right inguinal hernia 12/23/2016   Neuromuscular disorder (HCC)    neuropathy - mildly in bil hands-is improved since neck surgery   OA (osteoarthritis)    left hip   Pneumonia YRS AGO   Prostate cancer (HCC)    Spinal stenosis     Past Surgical History:  Procedure Laterality Date   ANTERIOR CERVICAL DECOMP/DISCECTOMY FUSION N/A 02/06/2016   Procedure: Cevical three-four, Cervical four-five, Cervical five-six anterior cervical decompression with fusion interbody prosthesis plating and bonegraft;  Surgeon: Gerldine Maizes, MD;  Location: [facility];  Service: Neurosurgery;  Laterality: N/A;   COLONOSCOPY     CYSTOSCOPY N/A 03/17/2020   Procedure: CYSTOSCOPY;  Surgeon: Renda Glance, MD;  Location: [facility];  Service: Urology;  Laterality: N/A;   INGUINAL HERNIA REPAIR Right 12/23/2016   Procedure: OPEN REPAIR RIGHT INGUINAL HERNIA WITH MESH;  Surgeon: Elon Pacini, MD;  Location: [facility];  Service: General;  Laterality: Right;   INSERTION OF MESH Right 12/23/2016   Procedure: INSERTION OF MESH;  Surgeon: Elon Pacini, MD;  Location: [facility];  Service: General;  Laterality: Right;   None     postate biopsy     RADIOACTIVE SEED  IMPLANT N/A 03/17/2020   Procedure: RADIOACTIVE SEED IMPLANT/BRACHYTHERAPY IMPLANT;  Surgeon: Renda Glance, MD;  Location: [facility];  Service: Urology;  Laterality: N/A;   SPACE OAR INSTILLATION N/A 03/17/2020   Procedure: SPACE OAR INSTILLATION;  Surgeon: Renda Glance, MD;  Location: [facility];  Service: Urology;  Laterality: N/A;   steroid injection to back  03/11/2020    Social History:  reports that he has never smoked. He has never used smokeless tobacco. He reports current alcohol use. He reports that he does not use drugs.  Allergies: No Known Allergies  Family History:  Family History  Problem Relation Age of Onset   Hypertension Father        Deceased, 80   Prostate cancer Father    Hypertension Mother        Deceased, 9   Renal cancer Mother        mets to lung   Colon cancer Mother    Healthy Sister    Esophageal cancer Paternal Grandfather    Rectal cancer Neg Hx    Stomach cancer Neg Hx    Breast cancer Neg Hx    Pancreatic cancer Neg Hx      Current Outpatient Medications:    amLODipine  (NORVASC ) 5 MG tablet, Take 1 tablet (5 mg total) by mouth daily., Disp: 90 tablet, Rfl: 1   carvedilol  (COREG ) 25 MG tablet, Take 1 tablet (25 mg total) by mouth 2 (two) times daily with a  meal., Disp: 90 tablet, Rfl: 1   doxycycline  (VIBRA -TABS) 100 MG tablet, Take 100 mg by mouth., Disp: , Rfl:    gabapentin  (NEURONTIN ) 300 MG capsule, TAKE 3 CAPSULES BY MOUTH  AT BEDTIME, Disp: 300 capsule, Rfl: 2   Ginger, Zingiber officinalis, (GINGER PO), Take 500 mg by mouth daily at 12 noon., Disp: , Rfl:    Glycerin-Hypromellose-PEG 400 0.2-0.36-1 % SOLN, Place 1-2 drops into both eyes 3 (three) times daily as needed (for dry/tired eyes.)., Disp: , Rfl:    losartan  (COZAAR ) 100 MG tablet, Take 1 tablet (100 mg total) by mouth daily., Disp: 100 tablet, Rfl: 1   Magnesium  500 MG TABS, Take by mouth. Take 1 tablet daily and at bedtime three nights per  week, Disp: , Rfl:    Multiple Vitamins-Minerals (MULTIVITAMIN ADULT PO), Take by mouth., Disp: , Rfl:    predniSONE (DELTASONE) 10 MG tablet, Take 40 mg by mouth., Disp: , Rfl:    sildenafil  (VIAGRA ) 100 MG tablet, Take 1 tablet (100 mg total) by mouth daily as needed., Disp: 10 tablet, Rfl: 11   triamcinolone  ointment (KENALOG ) 0.5 %, Apply 1 Application topically 2 (two) times daily., Disp: 45 g, Rfl: 0   Turmeric (QC TUMERIC COMPLEX PO), Take by mouth daily., Disp: , Rfl:   Review of Systems:  Negative unless indicated in HPI.   Physical Exam: Vitals:   11/01/24 0827 11/01/24 0832  BP: (!) 150/90 (!) 148/79  Pulse: 70   Temp: 98.1 F (36.7 C)   TempSrc: Oral   SpO2: 98%   Weight: 198 lb 14.4 oz (90.2 kg)     Body mass index is 26.98 kg/m.   Physical Exam Vitals reviewed.  Constitutional:      Appearance: Normal appearance.  HENT:     Head: Normocephalic and atraumatic.  Eyes:     Conjunctiva/sclera: Conjunctivae normal.  Cardiovascular:     Rate and Rhythm: Normal rate and regular rhythm.  Pulmonary:     Effort: Pulmonary effort is normal.     Breath sounds: Normal breath sounds.  Musculoskeletal:     Right lower leg: Edema present.     Left lower leg: Edema present.  Skin:    General: Skin is warm and dry.  Neurological:     General: No focal deficit present.     Mental Status: He is alert and oriented to person, place, and time.  Psychiatric:        Mood and Affect: Mood normal.        Behavior: Behavior normal.        Thought Content: Thought content normal.        Judgment: Judgment normal.      Impression and Plan:  Bilateral leg edema -     Ambulatory [facility] to [facility]  Essential hypertension -     amLODIPine  Besylate; Take 1 tablet (5 mg total) by mouth daily.  Dispense: 90 tablet; Refill: 1 -     Ambulatory referral to [facility]   - Decrease amlodipine  from 10 to 5 mg, this might help with lower  extremity edema.  Placed referral to advanced hypertension clinic as we have been having difficulty controlling blood pressure.  Time spent:31 minutes reviewing chart, interviewing and examining patient and formulating plan of care.     Tully Theophilus Andrews, MD [facility] at [facility]

## 2024-11-15 ENCOUNTER — Encounter: Payer: Self-pay | Admitting: Internal Medicine

## 2024-11-15 ENCOUNTER — Telehealth: Payer: Self-pay | Admitting: *Deleted

## 2024-11-15 NOTE — Telephone Encounter (Signed)
 Copied from CRM [id number]. Topic: Clinical - Medical Advice >> Nov 15, 2024 12:18 PM Shereese L wrote: Reason for RMF:Ejupzwu fell 2 days ago and hand stopped his fall, hit his head and it left a little bruise which is no longer there. No pain,no loss of consciousness  vision is good but eye lid is black and uploading pictures .SABRA Requesting a call back [employer] Cb#[phone]

## 2024-11-15 NOTE — Telephone Encounter (Signed)
 Responded via MyChart.

## 2024-12-09 ENCOUNTER — Encounter: Payer: Self-pay | Admitting: Internal Medicine

## 2025-01-17 ENCOUNTER — Encounter (HOSPITAL_BASED_OUTPATIENT_CLINIC_OR_DEPARTMENT_OTHER): Payer: Self-pay

## 2025-01-18 ENCOUNTER — Encounter (HOSPITAL_BASED_OUTPATIENT_CLINIC_OR_DEPARTMENT_OTHER): Payer: Self-pay | Admitting: Family

## 2025-01-21 ENCOUNTER — Institutional Professional Consult (permissible substitution) (HOSPITAL_BASED_OUTPATIENT_CLINIC_OR_DEPARTMENT_OTHER): Admitting: Family

## 2025-01-23 ENCOUNTER — Encounter: Payer: Self-pay | Admitting: Internal Medicine

## 2025-01-29 ENCOUNTER — Other Ambulatory Visit: Payer: Self-pay | Admitting: Internal Medicine

## 2025-01-29 DIAGNOSIS — I10 Essential (primary) hypertension: Secondary | ICD-10-CM

## 2025-02-04 ENCOUNTER — Institutional Professional Consult (permissible substitution) (HOSPITAL_BASED_OUTPATIENT_CLINIC_OR_DEPARTMENT_OTHER): Admitting: Family

## 2025-02-21 ENCOUNTER — Institutional Professional Consult (permissible substitution) (HOSPITAL_BASED_OUTPATIENT_CLINIC_OR_DEPARTMENT_OTHER): Admitting: Family

## 2025-02-26 ENCOUNTER — Encounter: Payer: Medicare Other | Admitting: Internal Medicine

## 2025-04-02 ENCOUNTER — Encounter: Admitting: Internal Medicine
